# Patient Record
Sex: Male | Born: 1955 | Race: Black or African American | Hispanic: No | Marital: Married | Smoking: Former smoker
Health system: Southern US, Community
[De-identification: ages and names within clinical notes are randomized; demographics above are authoritative.]

## PROBLEM LIST (undated history)

## (undated) DIAGNOSIS — K649 Unspecified hemorrhoids: Secondary | ICD-10-CM

## (undated) DIAGNOSIS — G4730 Sleep apnea, unspecified: Secondary | ICD-10-CM

## (undated) DIAGNOSIS — I10 Essential (primary) hypertension: Secondary | ICD-10-CM

## (undated) DIAGNOSIS — N400 Enlarged prostate without lower urinary tract symptoms: Secondary | ICD-10-CM

## (undated) DIAGNOSIS — M1990 Unspecified osteoarthritis, unspecified site: Secondary | ICD-10-CM

## (undated) DIAGNOSIS — R7309 Other abnormal glucose: Secondary | ICD-10-CM

## (undated) DIAGNOSIS — M6530 Trigger finger, unspecified finger: Secondary | ICD-10-CM

## (undated) DIAGNOSIS — T7840XA Allergy, unspecified, initial encounter: Secondary | ICD-10-CM

## (undated) HISTORY — DX: Benign prostatic hyperplasia without lower urinary tract symptoms: N40.0

## (undated) HISTORY — DX: Essential (primary) hypertension: I10

## (undated) HISTORY — DX: Unspecified osteoarthritis, unspecified site: M19.90

## (undated) HISTORY — DX: Other abnormal glucose: R73.09

## (undated) HISTORY — DX: Trigger finger, unspecified finger: M65.30

## (undated) HISTORY — DX: Sleep apnea, unspecified: G47.30

## (undated) HISTORY — DX: Allergy, unspecified, initial encounter: T78.40XA

---

## 1980-06-16 HISTORY — PX: VASECTOMY: SHX75

## 2004-06-16 HISTORY — PX: COLONOSCOPY: SHX174

## 2005-01-22 ENCOUNTER — Ambulatory Visit: Payer: Self-pay

## 2007-06-12 ENCOUNTER — Ambulatory Visit: Payer: Self-pay | Admitting: Family Medicine

## 2007-06-17 HISTORY — PX: HERNIA REPAIR: SHX51

## 2007-10-29 ENCOUNTER — Ambulatory Visit: Payer: Self-pay | Admitting: Family Medicine

## 2008-05-26 ENCOUNTER — Ambulatory Visit: Payer: Self-pay | Admitting: General Surgery

## 2008-06-02 ENCOUNTER — Ambulatory Visit: Payer: Self-pay | Admitting: General Surgery

## 2009-03-03 DIAGNOSIS — R609 Edema, unspecified: Secondary | ICD-10-CM | POA: Insufficient documentation

## 2010-03-15 DIAGNOSIS — R7309 Other abnormal glucose: Secondary | ICD-10-CM | POA: Insufficient documentation

## 2010-03-15 DIAGNOSIS — I10 Essential (primary) hypertension: Secondary | ICD-10-CM | POA: Insufficient documentation

## 2011-10-22 ENCOUNTER — Ambulatory Visit: Payer: Self-pay | Admitting: Family Medicine

## 2013-12-07 ENCOUNTER — Encounter: Payer: Self-pay | Admitting: *Deleted

## 2013-12-23 ENCOUNTER — Encounter: Payer: Self-pay | Admitting: General Surgery

## 2013-12-26 ENCOUNTER — Encounter: Payer: Self-pay | Admitting: General Surgery

## 2013-12-26 ENCOUNTER — Ambulatory Visit (INDEPENDENT_AMBULATORY_CARE_PROVIDER_SITE_OTHER): Payer: BC Managed Care – PPO | Admitting: General Surgery

## 2013-12-26 VITALS — BP 128/76 | HR 74 | Resp 14 | Ht 71.0 in | Wt 258.0 lb

## 2013-12-26 DIAGNOSIS — M6208 Separation of muscle (nontraumatic), other site: Secondary | ICD-10-CM

## 2013-12-26 DIAGNOSIS — M6200 Separation of muscle (nontraumatic), unspecified site: Secondary | ICD-10-CM

## 2013-12-26 NOTE — Patient Instructions (Signed)
Patient to return as needed. The patient is aware to call back for any questions or concerns. 

## 2013-12-26 NOTE — Progress Notes (Signed)
Patient ID: Eric Maxon., male   DOB: 09-Jun-1956, 58 y.o.   MRN: [id number]  Chief Complaint  Patient presents with  . Other    evaluation of bulge in stomach    HPI Eric Kondracki. is a 58 y.o. male who present for an evaluation of a bulge in his stomach. He states it has been presents for approximately 3-5 years. He denies any pain in the area. The are has not gotten larger. No bowel problems.  The patient has been doing upper extremity strength training. He is not appreciated any bulge in the abdomen while standing or supine, only with changes in position.  HPI  Past Medical History  Diagnosis Date  . Hypertension   . Sleep apnea   . Prostate enlargement     Past Surgical History  Procedure Laterality Date  . Vasectomy  1982  . Hernia repair  5625    umbilical    Family History  Problem Relation Age of Onset  . Heart disease Father     Social History History  Substance Use Topics  . Smoking status: Former Smoker -- 1.00 packs/day for 24 years    Types: Cigarettes  . Smokeless tobacco: Not on file  . Alcohol Use: Yes    No Known Allergies  Current Outpatient Prescriptions  Medication Sig Dispense Refill  . lisinopril-hydrochlorothiazide (PRINZIDE,ZESTORETIC) 20-25 MG per tablet Take 1 tablet by mouth daily.      . Omega-3 Fatty Acids (FISH OIL) 1200 MG CAPS Take 1 capsule by mouth 2 (two) times daily.       No current facility-administered medications for this visit.    Review of Systems Review of Systems  Constitutional: Negative.   Respiratory: Negative.   Cardiovascular: Negative.   Gastrointestinal: Negative.     Blood pressure 128/76, pulse 74, resp. rate 14, height 5\' 11"  (1.803 m), weight 258 lb (117.028 kg).  Physical Exam Physical Exam  Constitutional: He is oriented to person, place, and time. He appears well-developed and well-nourished.  Cardiovascular: Normal rate, regular rhythm and normal heart sounds.   No murmur  heard. Pulmonary/Chest: Effort normal and breath sounds normal.  Abdominal: Soft. Normal appearance and bowel sounds are normal. There is no hepatosplenomegaly. There is no tenderness. No hernia.    Diastasis recti present.  The previously repaired umbilical hernia  Neurological: He is alert and oriented to person, place, and time.  Skin: Skin is warm and dry.      Assessment    Diastases recti, asymptomatic.     Plan    The mechanics of the rectus muscles, and the sail-like effect of the midline fascia was reviewed with the patient. Surgical imbrication can be done for cosmetic appearances, but I would not encourage this procedure.  The patient will notify the office if he has any other concerns, follow up otherwise will be on an as needed basis.     PCP: Mamie Laurel 12/27/2013, 9:00 PM

## 2013-12-27 DIAGNOSIS — M6208 Separation of muscle (nontraumatic), other site: Secondary | ICD-10-CM | POA: Insufficient documentation

## 2014-12-06 ENCOUNTER — Encounter: Payer: BLUE CROSS/BLUE SHIELD | Admitting: Family Medicine

## 2014-12-12 ENCOUNTER — Ambulatory Visit (INDEPENDENT_AMBULATORY_CARE_PROVIDER_SITE_OTHER): Payer: BLUE CROSS/BLUE SHIELD | Admitting: Family Medicine

## 2014-12-12 ENCOUNTER — Encounter: Payer: Self-pay | Admitting: Family Medicine

## 2014-12-12 VITALS — BP 112/68 | HR 88 | Temp 97.8°F | Resp 18 | Ht 68.0 in | Wt 263.9 lb

## 2014-12-12 DIAGNOSIS — J309 Allergic rhinitis, unspecified: Secondary | ICD-10-CM | POA: Insufficient documentation

## 2014-12-12 DIAGNOSIS — Z0000 Encounter for general adult medical examination without abnormal findings: Secondary | ICD-10-CM | POA: Insufficient documentation

## 2014-12-12 NOTE — Patient Instructions (Addendum)
Obesity Obesity is defined as having too much total body fat and a body mass index (BMI) of 30 or more. BMI is an estimate of body fat and is calculated from your height and weight. Obesity happens when you consume more calories than you can burn by exercising or performing daily physical tasks. Prolonged obesity can cause major illnesses or emergencies, such as:   Stroke.  Heart disease.  Diabetes.  Cancer.  Arthritis.  High blood pressure (hypertension).  High cholesterol.  Sleep apnea.  Erectile dysfunction.  Infertility problems. CAUSES   Regularly eating unhealthy foods.  Physical inactivity.  Certain disorders, such as an underactive thyroid (hypothyroidism), Cushing's syndrome, and polycystic ovarian syndrome.  Certain medicines, such as steroids, some depression medicines, and antipsychotics.  Genetics.  Lack of sleep. DIAGNOSIS  A health care provider can diagnose obesity after calculating your BMI. Obesity will be diagnosed if your BMI is 30 or higher.  There are other methods of measuring obesity levels. Some other methods include measuring your skinfold thickness, your waist circumference, and comparing your hip circumference to your waist circumference. TREATMENT  A healthy treatment program includes some or all of the following:  Long-term dietary changes.  Exercise and physical activity.  Behavioral and lifestyle changes.  Medicine only under the supervision of your health care provider. Medicines may help, but only if they are used with diet and exercise programs. An unhealthy treatment program includes:  Fasting.  Fad diets.  Supplements and drugs. These choices do not succeed in long-term weight control.  HOME CARE INSTRUCTIONS   Exercise and perform physical activity as directed by your health care provider. To increase physical activity, try the following:  Use stairs instead of elevators.  Park farther away from store  entrances.  Garden, bike, or walk instead of watching television or using the computer.  Eat healthy, low-calorie foods and drinks on a regular basis. Eat more fruits and vegetables. Use low-calorie cookbooks or take healthy cooking classes.  Limit fast food, sweets, and processed snack foods.  Eat smaller portions.  Keep a daily journal of everything you eat. There are many free websites to help you with this. It may be helpful to measure your foods so you can determine if you are eating the correct portion sizes.  Avoid drinking alcohol. Drink more water and drinks without calories.  Take vitamins and supplements only as recommended by your health care provider.  Weight-loss support groups, Tax adviser, counselors, and stress reduction education can also be very helpful. SEEK IMMEDIATE MEDICAL CARE IF:  You have chest pain or tightness.  You have trouble breathing or feel short of breath.  You have weakness or leg numbness.  You feel confused or have trouble talking.  You have sudden changes in your vision. MAKE SURE YOU:  Understand these instructions.  Will watch your condition.  Will get help right away if you are not doing well or get worse. Document Released: 07/10/2004 Document Revised: 10/17/2013 Document Reviewed: 07/09/2011 [facility] Patient Information 2015 [place], [place]. This information is not intended to replace advice given to you by your health care provider. Make sure you discuss any questions you have with your health care provider. Obesity Obesity is defined as having too much total body fat and a body mass index (BMI) of 30 or more. BMI is an estimate of body fat and is calculated from your height and weight. Obesity happens when you consume more calories than you can burn by exercising or performing daily physical  tasks. Prolonged obesity can cause major illnesses or emergencies, such as:   Stroke.  Heart  disease.  Diabetes.  Cancer.  Arthritis.  High blood pressure (hypertension).  High cholesterol.  Sleep apnea.  Erectile dysfunction.  Infertility problems. CAUSES   Regularly eating unhealthy foods.  Physical inactivity.  Certain disorders, such as an underactive thyroid (hypothyroidism), Cushing's syndrome, and polycystic ovarian syndrome.  Certain medicines, such as steroids, some depression medicines, and antipsychotics.  Genetics.  Lack of sleep. DIAGNOSIS  A health care provider can diagnose obesity after calculating your BMI. Obesity will be diagnosed if your BMI is 30 or higher.  There are other methods of measuring obesity levels. Some other methods include measuring your skinfold thickness, your waist circumference, and comparing your hip circumference to your waist circumference. TREATMENT  A healthy treatment program includes some or all of the following:  Long-term dietary changes.  Exercise and physical activity.  Behavioral and lifestyle changes.  Medicine only under the supervision of your health care provider. Medicines may help, but only if they are used with diet and exercise programs. An unhealthy treatment program includes:  Fasting.  Fad diets.  Supplements and drugs. These choices do not succeed in long-term weight control.  HOME CARE INSTRUCTIONS   Exercise and perform physical activity as directed by your health care provider. To increase physical activity, try the following:  Use stairs instead of elevators.  Park farther away from store entrances.  Garden, bike, or walk instead of watching television or using the computer.  Eat healthy, low-calorie foods and drinks on a regular basis. Eat more fruits and vegetables. Use low-calorie cookbooks or take healthy cooking classes.  Limit fast food, sweets, and processed snack foods.  Eat smaller portions.  Keep a daily journal of everything you eat. There are many free websites to  help you with this. It may be helpful to measure your foods so you can determine if you are eating the correct portion sizes.  Avoid drinking alcohol. Drink more water and drinks without calories.  Take vitamins and supplements only as recommended by your health care provider.  Weight-loss support groups, Tax adviser, counselors, and stress reduction education can also be very helpful. SEEK IMMEDIATE MEDICAL CARE IF:  You have chest pain or tightness.  You have trouble breathing or feel short of breath.  You have weakness or leg numbness.  You feel confused or have trouble talking.  You have sudden changes in your vision. MAKE SURE YOU:  Understand these instructions.  Will watch your condition.  Will get help right away if you are not doing well or get worse. Document Released: 07/10/2004 Document Revised: 10/17/2013 Document Reviewed: 07/09/2011 [facility] Patient Information 2015 [place], [place]. This information is not intended to replace advice given to you by your health care provider. Make sure you discuss any questions you have with your health care provider.

## 2014-12-12 NOTE — Progress Notes (Signed)
Name: Eric English.   MRN: [id number]    DOB: May 25, 1956   Date:12/12/2014       Progress Note  Subjective  Chief Complaint  Chief Complaint  Patient presents with  . Annual Exam    HPI   59 year old presenting for annual H&P no significant new problems  Past Medical History  Diagnosis Date  . Hypertension   . Sleep apnea   . Prostate enlargement   . Trigger finger   . Abnormal glucose     History  Substance Use Topics  . Smoking status: Former Smoker -- 1.00 packs/day for 24 years    Types: Cigarettes  . Smokeless tobacco: Not on file  . Alcohol Use: 0.0 oz/week    0 Standard drinks or equivalent per week     Comment: Occasionally     Current outpatient prescriptions:  .  fluticasone (FLONASE ALLERGY RELIEF) 50 MCG/ACT nasal spray, Place 2 sprays into the nose daily., Disp: , Rfl:  .  lisinopril-hydrochlorothiazide (PRINZIDE,ZESTORETIC) 20-25 MG per tablet, Take 1 tablet by mouth daily., Disp: , Rfl:  .  Omega-3 Fatty Acids (FISH OIL) 1200 MG CAPS, Take 1 capsule by mouth 2 (two) times daily., Disp: , Rfl:   No Known Allergies  Review of Systems  Constitutional: Negative for fever, chills and weight loss.       Obese  HENT: Positive for congestion and ear pain. Negative for hearing loss, sore throat and tinnitus.   Eyes: Negative for blurred vision, double vision and redness.  Respiratory: Positive for cough. Negative for hemoptysis and shortness of breath.   Cardiovascular: Positive for leg swelling. Negative for chest pain, palpitations, orthopnea and claudication.  Gastrointestinal: Negative for heartburn, nausea, vomiting, diarrhea, constipation and blood in stool.  Genitourinary: Negative for dysuria, urgency, frequency and hematuria.  Musculoskeletal: Negative for myalgias, back pain, joint pain, falls and neck pain.  Skin: Negative for itching.  Neurological: Negative for dizziness, tingling, tremors, focal weakness, seizures, loss of consciousness,  weakness and headaches.  Endo/Heme/Allergies: Does not bruise/bleed easily.  Psychiatric/Behavioral: Negative for depression, memory loss and substance abuse. The patient is not nervous/anxious and does not have insomnia.      Objective  Filed Vitals:   12/12/14 1508  BP: 112/68  Pulse: 88  Temp: 97.8 F (36.6 C)  TempSrc: Oral  Resp: 18  Height: 5\' 8"  (1.727 m)  Weight: 263 lb 14.4 oz (119.704 kg)  SpO2: 96%     Physical Exam  Constitutional: He is oriented to person, place, and time and well-developed, well-nourished, and in no distress.  Obese  HENT:  Head: Normocephalic.  Eyes: EOM are normal. Pupils are equal, round, and reactive to light.  Neck: Normal range of motion. Neck supple. No thyromegaly present.  Cardiovascular: Normal rate, regular rhythm and normal heart sounds.   No murmur heard. Pulmonary/Chest: Effort normal and breath sounds normal. No respiratory distress. He has no wheezes.  Abdominal: Soft. Bowel sounds are normal.  Diastases recti  Genitourinary: Rectum normal, prostate normal and penis normal. Guaiac negative stool.  Musculoskeletal: Normal range of motion. He exhibits tenderness. He exhibits no edema.  He exhibits some arthritic changes in several of the joints of the hand and also has a right third trigger finger  Lymphadenopathy:    He has no cervical adenopathy.  Neurological: He is alert and oriented to person, place, and time. No cranial nerve deficit. Gait normal. Coordination normal.  Skin: Skin is warm and dry. No rash noted.  Psychiatric: Affect and judgment normal.      Assessment & Plan  1. Annual physical exam  - CBC with Differential/Platelet - PSA - Comprehensive metabolic panel - Lipid panel - POC Hemoccult Bld/Stl (3-Cd Home Screen); Future - TSH - POC Hemoccult Bld/Stl (1-Cd Office Dx) - POC Hemoccult Bld/Stl (3-Cd Home Screen); Future

## 2014-12-25 LAB — CBC WITH DIFFERENTIAL/PLATELET
BASOS ABS: 0 10*3/uL (ref 0.0–0.2)
Basos: 0 %
EOS (ABSOLUTE): 0.1 10*3/uL (ref 0.0–0.4)
Eos: 1 %
HEMATOCRIT: 41.8 % (ref 37.5–51.0)
Hemoglobin: 13.7 g/dL (ref 12.6–17.7)
IMMATURE GRANS (ABS): 0 10*3/uL (ref 0.0–0.1)
Immature Granulocytes: 0 %
LYMPHS ABS: 2.1 10*3/uL (ref 0.7–3.1)
Lymphs: 32 %
MCH: 28.8 pg (ref 26.6–33.0)
MCHC: 32.8 g/dL (ref 31.5–35.7)
MCV: 88 fL (ref 79–97)
MONOCYTES: 10 %
Monocytes Absolute: 0.7 10*3/uL (ref 0.1–0.9)
NEUTROS ABS: 3.8 10*3/uL (ref 1.4–7.0)
Neutrophils: 57 %
Platelets: 271 10*3/uL (ref 150–379)
RBC: 4.76 x10E6/uL (ref 4.14–5.80)
RDW: 14.1 % (ref 12.3–15.4)
WBC: 6.6 10*3/uL (ref 3.4–10.8)

## 2014-12-26 LAB — COMPREHENSIVE METABOLIC PANEL
ALT: 37 IU/L (ref 0–44)
AST: 25 IU/L (ref 0–40)
Albumin/Globulin Ratio: 1.3 (ref 1.1–2.5)
Albumin: 4.3 g/dL (ref 3.6–4.8)
Alkaline Phosphatase: 64 IU/L (ref 39–117)
BILIRUBIN TOTAL: 0.4 mg/dL (ref 0.0–1.2)
BUN/Creatinine Ratio: 9 — ABNORMAL LOW (ref 10–22)
BUN: 10 mg/dL (ref 8–27)
CO2: 26 mmol/L (ref 18–29)
Calcium: 9.8 mg/dL (ref 8.6–10.2)
Chloride: 97 mmol/L (ref 97–108)
Creatinine, Ser: 1.06 mg/dL (ref 0.76–1.27)
GFR calc Af Amer: 88 mL/min/{1.73_m2} (ref >59)
GFR, EST NON AFRICAN AMERICAN: 76 mL/min/{1.73_m2} (ref >59)
Globulin, Total: 3.3 g/dL (ref 1.5–4.5)
Glucose: 93 mg/dL (ref 65–99)
POTASSIUM: 4 mmol/L (ref 3.5–5.2)
SODIUM: 138 mmol/L (ref 134–144)
TOTAL PROTEIN: 7.6 g/dL (ref 6.0–8.5)

## 2014-12-26 LAB — PSA: PROSTATE SPECIFIC AG, SERUM: 3.6 ng/mL (ref 0.0–4.0)

## 2014-12-26 LAB — LIPID PANEL
Chol/HDL Ratio: 4.5 ratio units (ref 0.0–5.0)
Cholesterol, Total: 183 mg/dL (ref 100–199)
HDL: 41 mg/dL (ref >39)
LDL CALC: 113 mg/dL — AB (ref 0–99)
TRIGLYCERIDES: 144 mg/dL (ref 0–149)
VLDL CHOLESTEROL CAL: 29 mg/dL (ref 5–40)

## 2014-12-26 LAB — TSH: TSH: 1.41 u[IU]/mL (ref 0.450–4.500)

## 2015-01-10 ENCOUNTER — Encounter: Admitting: Family Medicine

## 2015-02-28 ENCOUNTER — Ambulatory Visit (INDEPENDENT_AMBULATORY_CARE_PROVIDER_SITE_OTHER): Payer: BLUE CROSS/BLUE SHIELD | Admitting: Family Medicine

## 2015-02-28 ENCOUNTER — Encounter: Payer: Self-pay | Admitting: Family Medicine

## 2015-02-28 VITALS — BP 128/68 | HR 68 | Temp 97.6°F | Resp 16 | Ht 68.0 in | Wt 262.5 lb

## 2015-02-28 DIAGNOSIS — Z23 Encounter for immunization: Secondary | ICD-10-CM

## 2015-02-28 DIAGNOSIS — I10 Essential (primary) hypertension: Secondary | ICD-10-CM | POA: Diagnosis not present

## 2015-03-07 NOTE — Progress Notes (Signed)
Name: Eric English.   MRN: [id number]    DOB: [birth year]   Date:03/07/2015       Progress Note  Subjective  Chief Complaint  Chief Complaint  Patient presents with  . Hypertension    pt here for 3 month follow up    HPI  Hypertension  Patient presents for follow-up of hypertension. Risk factors include obesity sedentary lifestyle and lipid abnormality inactivity as far as cardiac risk concern.  He is not exercising with regularity. He is not following his low-sodium low-fat diet with regularity.    Past Medical History  Diagnosis Date  . Hypertension   . Sleep apnea   . Prostate enlargement   . Trigger finger   . Abnormal glucose     Social History  Substance Use Topics  . Smoking status: Former Smoker -- 1.00 packs/day for 24 years    Types: Cigarettes  . Smokeless tobacco: Not on file  . Alcohol Use: 0.0 oz/week    0 Standard drinks or equivalent per week     Comment: Occasionally     Current outpatient prescriptions:  .  fluticasone (FLONASE ALLERGY RELIEF) 50 MCG/ACT nasal spray, Place 2 sprays into the nose daily., Disp: , Rfl:  .  lisinopril-hydrochlorothiazide (PRINZIDE,ZESTORETIC) 20-25 MG per tablet, Take 1 tablet by mouth daily., Disp: , Rfl:  .  Omega-3 Fatty Acids (FISH OIL) 1200 MG CAPS, Take 1 capsule by mouth 2 (two) times daily., Disp: , Rfl:   No Known Allergies  Review of Systems  Constitutional: Negative for fever, chills and weight loss.  HENT: Negative for congestion, hearing loss, sore throat and tinnitus.   Eyes: Negative for blurred vision, double vision and redness.  Respiratory: Negative for cough, hemoptysis and shortness of breath.   Cardiovascular: Negative for chest pain, palpitations, orthopnea, claudication and leg swelling.  Gastrointestinal: Negative for heartburn, nausea, vomiting, diarrhea, constipation and blood in stool.  Genitourinary: Negative for dysuria, urgency, frequency and hematuria.  Musculoskeletal: Positive  for joint pain. Negative for myalgias, back pain, falls and neck pain.  Skin: Negative for itching.  Neurological: Negative for dizziness, tingling, tremors, focal weakness, seizures, loss of consciousness, weakness and headaches.  Endo/Heme/Allergies: Does not bruise/bleed easily.  Psychiatric/Behavioral: Negative for depression and substance abuse. The patient is not nervous/anxious and does not have insomnia.      Objective  Filed Vitals:   02/28/15 1156  BP: 128/68  Pulse: 68  Temp: 97.6 F (36.4 C)  Resp: 16  Height: 5\' 8"  (1.727 m)  Weight: 262 lb 8 oz (119.069 kg)  SpO2: 97%     Physical Exam  Constitutional: He is oriented to person, place, and time and well-developed, well-nourished, and in no distress.  Obese and in no acute distress  HENT:  Head: Normocephalic.  Eyes: EOM are normal. Pupils are equal, round, and reactive to light.  Neck: Normal range of motion. Neck supple. No thyromegaly present.  Cardiovascular: Normal rate, regular rhythm and normal heart sounds.   No murmur heard. Pulmonary/Chest: Effort normal and breath sounds normal. No respiratory distress. He has no wheezes.  Abdominal: Soft. Bowel sounds are normal.  Musculoskeletal: Normal range of motion. He exhibits no edema.  Lymphadenopathy:    He has no cervical adenopathy.  Neurological: He is alert and oriented to person, place, and time. No cranial nerve deficit. Gait normal. Coordination normal.  Skin: Skin is warm and dry. No rash noted.  Psychiatric: Mood, memory, affect and judgment normal.  Assessment & Plan  1. Essential hypertension Well-controlled given today  2. Need for influenza vaccination Given today  Flu Vaccine QUAD 36+ mos PF IM (Fluarix & Fluzone Quad PF)

## 2015-03-13 DIAGNOSIS — Z23 Encounter for immunization: Secondary | ICD-10-CM

## 2015-05-31 ENCOUNTER — Telehealth: Payer: Self-pay | Admitting: Family Medicine

## 2015-05-31 DIAGNOSIS — Z1211 Encounter for screening for malignant neoplasm of colon: Secondary | ICD-10-CM

## 2015-06-05 ENCOUNTER — Other Ambulatory Visit: Payer: Self-pay | Admitting: Family Medicine

## 2015-06-05 MED ORDER — LISINOPRIL-HYDROCHLOROTHIAZIDE 20-25 MG PO TABS: 1.0000 | ORAL_TABLET | Freq: Every day | ORAL | Status: DC

## 2015-06-13 NOTE — Telephone Encounter (Signed)
Patient notified

## 2015-06-21 ENCOUNTER — Ambulatory Visit
Admission: RE | Admit: 2015-06-21 | Discharge: 2015-06-21 | Disposition: A | Discharge status: Home / Self Care | Payer: BLUE CROSS/BLUE SHIELD | Source: Ambulatory Visit | Attending: Family Medicine | Admitting: Family Medicine

## 2015-06-21 ENCOUNTER — Ambulatory Visit (INDEPENDENT_AMBULATORY_CARE_PROVIDER_SITE_OTHER): Payer: BLUE CROSS/BLUE SHIELD | Admitting: General Surgery

## 2015-06-21 ENCOUNTER — Encounter: Payer: Self-pay | Admitting: General Surgery

## 2015-06-21 ENCOUNTER — Ambulatory Visit (INDEPENDENT_AMBULATORY_CARE_PROVIDER_SITE_OTHER): Payer: BLUE CROSS/BLUE SHIELD | Admitting: Family Medicine

## 2015-06-21 ENCOUNTER — Encounter: Payer: Self-pay | Admitting: Family Medicine

## 2015-06-21 ENCOUNTER — Telehealth: Payer: Self-pay | Admitting: Emergency Medicine

## 2015-06-21 VITALS — BP 138/78 | HR 66 | Resp 14 | Ht 70.0 in | Wt 264.0 lb

## 2015-06-21 VITALS — BP 132/78 | HR 83 | Temp 98.2°F | Resp 18 | Ht 68.0 in | Wt 264.0 lb

## 2015-06-21 DIAGNOSIS — I809 Phlebitis and thrombophlebitis of unspecified site: Secondary | ICD-10-CM

## 2015-06-21 DIAGNOSIS — M79671 Pain in right foot: Secondary | ICD-10-CM

## 2015-06-21 DIAGNOSIS — L03115 Cellulitis of right lower limb: Secondary | ICD-10-CM | POA: Diagnosis not present

## 2015-06-21 DIAGNOSIS — M25571 Pain in right ankle and joints of right foot: Secondary | ICD-10-CM | POA: Insufficient documentation

## 2015-06-21 DIAGNOSIS — M25474 Effusion, right foot: Secondary | ICD-10-CM | POA: Diagnosis not present

## 2015-06-21 DIAGNOSIS — M7989 Other specified soft tissue disorders: Secondary | ICD-10-CM | POA: Diagnosis not present

## 2015-06-21 DIAGNOSIS — L03129 Acute lymphangitis of unspecified part of limb: Secondary | ICD-10-CM

## 2015-06-21 DIAGNOSIS — Z1211 Encounter for screening for malignant neoplasm of colon: Secondary | ICD-10-CM

## 2015-06-21 MED ORDER — POLYETHYLENE GLYCOL 3350 17 GM/SCOOP PO POWD: ORAL | Status: DC

## 2015-06-21 MED ORDER — CLINDAMYCIN HCL 300 MG PO CAPS: 300.0000 mg | ORAL_CAPSULE | Freq: Three times a day (TID) | ORAL | Status: DC

## 2015-06-21 MED ORDER — INDOMETHACIN 50 MG PO CAPS: 50.0000 mg | ORAL_CAPSULE | Freq: Two times a day (BID) | ORAL | Status: DC

## 2015-06-21 NOTE — Telephone Encounter (Signed)
Patient notified L/M on cell

## 2015-06-21 NOTE — Progress Notes (Signed)
Name: Eric English.   MRN: [id number]    DOB: 1956-05-10   Date:06/21/2015       Progress Note  Subjective  Chief Complaint  Chief Complaint  Patient presents with  . Ankle Pain    possible gout    HPI  Right foot pain.  Complaint of right foot pain and discomfort over the past several days. Had a similar episode several months ago was seen by one of her colleagues. X-rays were normal but he did not get him. He was placed on methicillin for presumed gout and improved.  Patient states that he ate some and customary foods over the holidays and then a day later started having some itching sensation. He has had some chills but no documented fever. He's had some warmth and redness of the right lower extremity which is increasing. It began just at his ankle and then moved up slightly brisk But has continued to progress. There's no shortness of breath or hemoptysis. Has a history of any pulmonary emboli. 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 There is a family history of gout.    Past Medical History  Diagnosis Date  . Hypertension   . Sleep apnea   . Prostate enlargement   . Trigger finger   . Abnormal glucose     Social History  Substance Use Topics  . Smoking status: Former Smoker -- 1.00 packs/day for 24 years    Types: Cigarettes  . Smokeless tobacco: Not on file  . Alcohol Use: 0.0 oz/week    0 Standard drinks or equivalent per week     Comment: Occasionally     Current outpatient prescriptions:  .  fluticasone (FLONASE ALLERGY RELIEF) 50 MCG/ACT nasal spray, Place 2 sprays into the nose daily., Disp: , Rfl:  .  lisinopril-hydrochlorothiazide (PRINZIDE,ZESTORETIC) 20-25 MG tablet, Take 1 tablet by mouth daily., Disp: 90 tablet, Rfl: 1 .  Omega-3 Fatty Acids (FISH OIL) 1200 MG CAPS, Take 1 capsule by mouth 2 (two) times daily., Disp: , Rfl:   No Known Allergies  Review of Systems  Constitutional: Positive for chills. Negative for fever and weight loss.   HENT: Negative for congestion, hearing loss, sore throat and tinnitus.   Eyes: Negative for blurred vision, double vision and redness.  Respiratory: Negative for cough, hemoptysis and shortness of breath.   Cardiovascular: Negative for chest pain, palpitations, orthopnea, claudication and leg swelling.  Gastrointestinal: Negative for heartburn, nausea, vomiting, diarrhea, constipation and blood in stool.  Genitourinary: Negative for dysuria, urgency, frequency and hematuria.  Musculoskeletal: Positive for joint pain. Negative for myalgias, back pain, falls and neck pain.  Skin: Positive for itching and rash.  Neurological: Negative for dizziness, tingling, tremors, focal weakness, seizures, loss of consciousness, weakness and headaches.  Endo/Heme/Allergies: Does not bruise/bleed easily.  Psychiatric/Behavioral: Negative for depression and substance abuse. The patient is not nervous/anxious and does not have insomnia.      Objective  Filed Vitals:   06/21/15 0952  BP: 132/78  Pulse: 83  Temp: 98.2 F (36.8 C)  Resp: 18  Height: 5' 8"  (1.727 m)  Weight: 264 lb (119.75 kg)  SpO2: 98%     Physical Exam  Constitutional: He is oriented to person, place, and time.  Obese in no acute distress  HENT:  Head: Normocephalic.  Eyes: EOM are normal. Pupils are equal, round, and reactive to light.  Neck: Normal range of motion. Neck supple. No thyromegaly present.  Cardiovascular: Normal rate, regular rhythm, normal  heart sounds and intact distal pulses.   No murmur heard. Pulmonary/Chest: Effort normal and breath sounds normal. No respiratory distress. He has no wheezes.  Abdominal: Soft. Bowel sounds are normal.  Musculoskeletal: He exhibits no edema.  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 Homans sign on sign is negative. There is erythema on the overlying area.  Lymphadenopathy:    He has no cervical adenopathy.  Neurological: He  is alert and oriented to person, place, and time. No cranial nerve deficit. Gait normal. Coordination normal.  Skin: Skin is warm and dry. No rash noted. There is erythema.  Psychiatric: Affect and judgment normal.      Assessment & Plan   1. Foot pain, right Possible gout - Uric acid - indomethacin (INDOCIN) 50 MG capsule; Take 1 capsule (50 mg total) by mouth 2 (two) times daily with a meal.  Dispense: 30 capsule; Refill: 0 - DG Foot Complete Right; Future  2. Cellulitis of right lower extremity Consistent with cellulitis with lymphangitis - CBC - Sed Rate (ESR) - clindamycin (CLEOCIN) 300 MG capsule; Take 1 capsule (300 mg total) by mouth 3 (three) times daily.  Dispense: 30 capsule; Refill: 0  3. Lymphangitis, acute, lower leg Compression stockings or socks

## 2015-06-21 NOTE — Addendum Note (Signed)
Addended by: Lolita Rieger D on: 06/21/2015 11:36 AM   Modules accepted: Orders

## 2015-06-21 NOTE — Progress Notes (Signed)
Patient ID: Eric English., male   DOB: 1955-06-23, 60 y.o.   MRN: [id number]  Chief Complaint  Patient presents with  . Other    colonoscopy screening    HPI Eric English. is a 60 y.o. male here today for a evaluation an screening colonoscopy . Last colonoscopy was done in 2006. No GI problems.   He states he has been having some right inner lower leg swelling for three days now. He was started on Indocin and Cleocin by his PCP earlier today. No history of trauma.  I first interviewed the patient's history. HPI  Past Medical History  Diagnosis Date  . Hypertension   . Sleep apnea   . Prostate enlargement   . Trigger finger   . Abnormal glucose     Past Surgical History  Procedure Laterality Date  . Vasectomy  1982  . Hernia repair  [id number]    umbilical  . Colonoscopy  2006    Family History  Problem Relation Age of Onset  . Heart disease Father   . Diabetes Mother   . Hypertension Mother   . CAD Mother   . Hyperlipidemia Mother   . CAD Father   . Diabetes Father     Social History Social History  Substance Use Topics  . Smoking status: Former Smoker -- 1.00 packs/day for 24 years    Types: Cigarettes  . Smokeless tobacco: None  . Alcohol Use: 0.0 oz/week    0 Standard drinks or equivalent per week     Comment: Occasionally    No Known Allergies  Current Outpatient Prescriptions  Medication Sig Dispense Refill  . clindamycin (CLEOCIN) 300 MG capsule Take 1 capsule (300 mg total) by mouth 3 (three) times daily. 30 capsule 0  . fluticasone (FLONASE ALLERGY RELIEF) 50 MCG/ACT nasal spray Place 2 sprays into the nose daily.    . indomethacin (INDOCIN) 50 MG capsule Take 1 capsule (50 mg total) by mouth 2 (two) times daily with a meal. 30 capsule 0  . lisinopril-hydrochlorothiazide (PRINZIDE,ZESTORETIC) 20-25 MG tablet Take 1 tablet by mouth daily. 90 tablet 1  . Omega-3 Fatty Acids (FISH OIL) 1200 MG CAPS Take 1 capsule by mouth 2 (two) times daily.     . polyethylene glycol powder (GLYCOLAX/MIRALAX) powder 255 grams one bottle for colonoscopy prep 255 g 0   No current facility-administered medications for this visit.    Review of Systems Review of Systems  Constitutional: Negative.   Respiratory: Negative.   Cardiovascular: Negative.     Blood pressure 138/78, pulse 66, resp. rate 14, height 5\' 10"  (1.778 m), weight 264 lb (119.75 kg).  Physical Exam Physical Exam  Constitutional: He is oriented to person, place, and time. He appears well-developed and well-nourished.  Cardiovascular: Normal rate, regular rhythm and normal heart sounds.   Pulmonary/Chest: Effort normal and breath sounds normal.  Abdominal:    Musculoskeletal:       Legs: Neurological: He is alert and oriented to person, place, and time.  Skin: Skin is warm and dry.  Right leg is 42,32 and left leg 44, 32    Data Reviewed PCP notes.  Assessment    Candidate for screening colonoscopy.  Superficial phlebitis without evidence of DVT.    Plan         Colonoscopy with possible biopsy/polypectomy prn: Information regarding the procedure, including its potential risks and complications (including but not limited to perforation of the bowel, which may require emergency surgery to  repair, and bleeding) was verbally given to the patient. Educational information regarding lower intestinal endoscopy was given to the patient. Written instructions for how to complete the bowel prep using Miralax were provided. The importance of drinking ample fluids to avoid dehydration as a result of the prep emphasized.  Patient to pick up a probiotics.  This patient has been scheduled for a colonoscopy on 08-15-15 at [facility].  PCP:  Ashok Norris  This information has been scribed by Gaspar Cola CMA.    Eric English 06/23/2015, 9:05 AM

## 2015-06-21 NOTE — Patient Instructions (Addendum)
Colonoscopy A colonoscopy is an exam to look at the entire large intestine (colon). This exam can help find problems such as tumors, polyps, inflammation, and areas of bleeding. The exam takes about 1 hour.  LET [facility] CARE PROVIDER KNOW ABOUT:   Any allergies you have.  All medicines you are taking, including vitamins, herbs, eye drops, creams, and over-the-counter medicines.  Previous problems you or members of your family have had with the use of anesthetics.  Any blood disorders you have.  Previous surgeries you have had.  Medical conditions you have. RISKS AND COMPLICATIONS  Generally, this is a safe procedure. However, as with any procedure, complications can occur. Possible complications include:  Bleeding.  Tearing or rupture of the colon wall.  Reaction to medicines given during the exam.  Infection (rare). BEFORE THE PROCEDURE   Ask your health care provider about changing or stopping your regular medicines.  You may be prescribed an oral bowel prep. This involves drinking a large amount of medicated liquid, starting the day before your procedure. The liquid will cause you to have multiple loose stools until your stool is almost clear or light green. This cleans out your colon in preparation for the procedure.  Do not eat or drink anything else once you have started the bowel prep, unless your health care provider tells you it is safe to do so.  Arrange for someone to drive you home after the procedure. PROCEDURE   You will be given medicine to help you relax (sedative).  You will lie on your side with your knees bent.  A long, flexible tube with a light and camera on the end (colonoscope) will be inserted through the rectum and into the colon. The camera sends video back to a computer screen as it moves through the colon. The colonoscope also releases carbon dioxide gas to inflate the colon. This helps your health care provider see the area better.  During  the exam, your health care provider may take a small tissue sample (biopsy) to be examined under a microscope if any abnormalities are found.  The exam is finished when the entire colon has been viewed. AFTER THE PROCEDURE   Do not drive for 24 hours after the exam.  You may have a small amount of blood in your stool.  You may pass moderate amounts of gas and have mild abdominal cramping or bloating. This is caused by the gas used to inflate your colon during the exam.  Ask when your test results will be ready and how you will get your results. Make sure you get your test results.   This information is not intended to replace advice given to you by your health care provider. Make sure you discuss any questions you have with your health care provider.   Document Released: 05/30/2000 Document Revised: 03/23/2013 Document Reviewed: 02/07/2013 Elsevier Interactive Patient Education [employer].  This patient has been scheduled for a colonoscopy on 08-15-15 at [facility].

## 2015-06-22 LAB — CBC
Hematocrit: 40.2 % (ref 37.5–51.0)
Hemoglobin: 13.7 g/dL (ref 12.6–17.7)
MCH: 29.3 pg (ref 26.6–33.0)
MCHC: 34.1 g/dL (ref 31.5–35.7)
MCV: 86 fL (ref 79–97)
Platelets: 294 10*3/uL (ref 150–379)
RBC: 4.68 x10E6/uL (ref 4.14–5.80)
RDW: 13.7 % (ref 12.3–15.4)
WBC: 7.7 10*3/uL (ref 3.4–10.8)

## 2015-06-22 LAB — URIC ACID: URIC ACID: 7.7 mg/dL (ref 3.7–8.6)

## 2015-06-22 LAB — SEDIMENTATION RATE: SED RATE: 14 mm/h (ref 0–30)

## 2015-06-23 DIAGNOSIS — I809 Phlebitis and thrombophlebitis of unspecified site: Secondary | ICD-10-CM | POA: Insufficient documentation

## 2015-06-23 DIAGNOSIS — Z1211 Encounter for screening for malignant neoplasm of colon: Secondary | ICD-10-CM | POA: Insufficient documentation

## 2015-07-08 ENCOUNTER — Encounter: Payer: Self-pay | Admitting: Gynecology

## 2015-07-08 ENCOUNTER — Ambulatory Visit
Admission: EM | Admit: 2015-07-08 | Discharge: 2015-07-08 | Disposition: A | Discharge status: Home / Self Care | Payer: BLUE CROSS/BLUE SHIELD | Attending: Family Medicine | Admitting: Family Medicine

## 2015-07-08 DIAGNOSIS — T39395A Adverse effect of other nonsteroidal anti-inflammatory drugs [NSAID], initial encounter: Secondary | ICD-10-CM

## 2015-07-08 DIAGNOSIS — K521 Toxic gastroenteritis and colitis: Secondary | ICD-10-CM

## 2015-07-08 DIAGNOSIS — T3695XA Adverse effect of unspecified systemic antibiotic, initial encounter: Secondary | ICD-10-CM | POA: Diagnosis not present

## 2015-07-08 LAB — COMPREHENSIVE METABOLIC PANEL
ALT: 137 U/L — ABNORMAL HIGH (ref 17–63)
AST: 108 U/L — ABNORMAL HIGH (ref 15–41)
Albumin: 4 g/dL (ref 3.5–5.0)
Alkaline Phosphatase: 58 U/L (ref 38–126)
Anion gap: 9 (ref 5–15)
BUN: 13 mg/dL (ref 6–20)
CO2: 28 mmol/L (ref 22–32)
Calcium: 9.2 mg/dL (ref 8.9–10.3)
Chloride: 99 mmol/L — ABNORMAL LOW (ref 101–111)
Creatinine, Ser: 0.91 mg/dL (ref 0.61–1.24)
GFR calc Af Amer: >60 mL/min (ref >60)
GFR calc non Af Amer: >60 mL/min (ref >60)
Glucose, Bld: 108 mg/dL — ABNORMAL HIGH (ref 65–99)
Potassium: 3.4 mmol/L — ABNORMAL LOW (ref 3.5–5.1)
Sodium: 136 mmol/L (ref 135–145)
Total Bilirubin: 0.9 mg/dL (ref 0.3–1.2)
Total Protein: 8 g/dL (ref 6.5–8.1)

## 2015-07-08 LAB — CBC WITH DIFFERENTIAL/PLATELET
Basophils Absolute: 0 10*3/uL (ref 0–0.1)
Basophils Relative: 0 %
Eosinophils Absolute: 0.1 10*3/uL (ref 0–0.7)
Eosinophils Relative: 2 %
HCT: 41.9 % (ref 40.0–52.0)
Hemoglobin: 14.1 g/dL (ref 13.0–18.0)
Lymphocytes Relative: 12 %
Lymphs Abs: 0.7 10*3/uL — ABNORMAL LOW (ref 1.0–3.6)
MCH: 28.8 pg (ref 26.0–34.0)
MCHC: 33.6 g/dL (ref 32.0–36.0)
MCV: 85.8 fL (ref 80.0–100.0)
Monocytes Absolute: 0.6 10*3/uL (ref 0.2–1.0)
Monocytes Relative: 10 %
Neutro Abs: 4.4 10*3/uL (ref 1.4–6.5)
Neutrophils Relative %: 76 %
Platelets: 195 10*3/uL (ref 150–440)
RBC: 4.89 MIL/uL (ref 4.40–5.90)
RDW: 14.2 % (ref 11.5–14.5)
WBC: 5.7 10*3/uL (ref 3.8–10.6)

## 2015-07-08 LAB — C DIFFICILE QUICK SCREEN W PCR REFLEX
C Diff antigen: NEGATIVE
C Diff interpretation: NEGATIVE
C Diff toxin: NEGATIVE

## 2015-07-08 LAB — OCCULT BLOOD X 1 CARD TO LAB, STOOL: Fecal Occult Bld: NEGATIVE

## 2015-07-08 NOTE — ED Notes (Signed)
Patient c/o diarrhea x 2 days. Per patient was given medications for a foot problem. Per pt. Was given Rx. Clindamycin 300 mg and indomethacin 50mg . Per patient believe medication causing him his diarrhea.Per pt. Watery tarry stool.

## 2015-07-08 NOTE — Discharge Instructions (Signed)

## 2015-07-08 NOTE — ED Provider Notes (Signed)
CSN: [id number]     Arrival date & time 07/08/15  1034 History   First MD Initiated Contact with Patient 07/08/15 1238     Chief Complaint  Patient presents with  . Diarrhea   (Consider location/radiation/quality/duration/timing/severity/associated sxs/prior Treatment) HPI   Very pleasant 60 year old male who was just finished a 10 day course of clindamycin and 15 days of indomethacin because of a an infection of his lower right leg. The infection cleared up but 2 days ago he began to experiencing watery diarrhea that was very dark in color. Since turned into more of a tarry type stool. He's had no frank blood or mucus. He denies any abdominal pain nausea vomiting. He has not been febrile. He has since stopped taking the indomethacin indomethacin even had one more dose to take.  Past Medical History  Diagnosis Date  . Hypertension   . Sleep apnea   . Prostate enlargement   . Trigger finger   . Abnormal glucose    Past Surgical History  Procedure Laterality Date  . Vasectomy  1982  . Hernia repair  [id number]    umbilical  . Colonoscopy  2006   Family History  Problem Relation Age of Onset  . Heart disease Father   . Diabetes Mother   . Hypertension Mother   . CAD Mother   . Hyperlipidemia Mother   . CAD Father   . Diabetes Father    Social History  Substance Use Topics  . Smoking status: Former Smoker -- 1.00 packs/day for 24 years    Types: Cigarettes  . Smokeless tobacco: None  . Alcohol Use: 0.0 oz/week    0 Standard drinks or equivalent per week     Comment: Occasionally    Review of Systems  Constitutional: Negative for fever, chills, activity change, appetite change and fatigue.  Gastrointestinal: Positive for diarrhea. Negative for nausea, vomiting, abdominal pain, blood in stool, abdominal distention, anal bleeding and rectal pain.  All other systems reviewed and are negative.   Allergies  Review of patient's allergies indicates no known allergies.  Home  Medications   Prior to Admission medications   Medication Sig Start Date End Date Taking? Authorizing Provider  fluticasone [facility] ALLERGY RELIEF) 50 MCG/ACT nasal spray Place 2 sprays into the nose daily. 06/07/14  Yes Historical Provider, MD  indomethacin (INDOCIN) 50 MG capsule Take 1 capsule (50 mg total) by mouth 2 (two) times daily with a meal. 06/21/15  Yes Ashok Norris, MD  lisinopril-hydrochlorothiazide (PRINZIDE,ZESTORETIC) 20-25 MG tablet Take 1 tablet by mouth daily. 06/05/15  Yes Ashok Norris, MD  Omega-3 Fatty Acids (FISH OIL) 1200 MG CAPS Take 1 capsule by mouth 2 (two) times daily.   Yes Historical Provider, MD  clindamycin (CLEOCIN) 300 MG capsule Take 1 capsule (300 mg total) by mouth 3 (three) times daily. 06/21/15   Ashok Norris, MD  polyethylene glycol powder (GLYCOLAX/MIRALAX) powder 255 grams one bottle for colonoscopy prep 06/21/15   Robert Bellow, MD   Meds Ordered and Administered this Visit  Medications - No data to display  BP 132/68 mmHg  Pulse 71  Temp(Src) 98.3 F (36.8 C) (Oral)  Resp 18  Ht 5\' 10"  (1.778 m)  Wt 260 lb (117.935 kg)  BMI 37.31 kg/m2  SpO2 97% No data found.   Physical Exam  Constitutional: He is oriented to person, place, and time. He appears well-developed and well-nourished. No distress.  HENT:  Head: Normocephalic and atraumatic.  Eyes: Conjunctivae are normal. Pupils are  equal, round, and reactive to light.  Neck: Normal range of motion. Neck supple.  Pulmonary/Chest: Breath sounds normal. No respiratory distress. He has no wheezes. He has no rales.  Abdominal: Soft. He exhibits distension. He exhibits no mass. There is no tenderness. There is no rebound and no guarding.  Examination of the abdomen was mild distention. The patient does have a large ventral hernia present. Sounds are hypertonic. Patient does have a tympanitic percussion note. There is no tenderness present. There is no rebound or guarding present. Rectal  was not performed as the sample of stool he provided provide Hemoccult results  Musculoskeletal: Normal range of motion. He exhibits no edema or tenderness.  Lymphadenopathy:    He has no cervical adenopathy.  Neurological: He is alert and oriented to person, place, and time.  Skin: Skin is warm and dry. He is not diaphoretic.  Psychiatric: He has a normal mood and affect. His behavior is normal. Judgment and thought content normal.  Nursing note and vitals reviewed.   ED Course  Procedures (including critical care time)  Labs Review Labs Reviewed  CBC WITH DIFFERENTIAL/PLATELET - Abnormal; Notable for the following:    Lymphs Abs 0.7 (*)    All other components within normal limits  COMPREHENSIVE METABOLIC PANEL - Abnormal; Notable for the following:    Potassium 3.4 (*)    Chloride 99 (*)    Glucose, Bld 108 (*)    AST 108 (*)    ALT 137 (*)    All other components within normal limits  C DIFFICILE QUICK SCREEN W PCR REFLEX  GASTROINTESTINAL PANEL BY PCR, STOOL (REPLACES STOOL CULTURE)  OCCULT BLOOD X 1 CARD TO LAB, STOOL    Imaging Review No results found.   Visual Acuity Review  Right Eye Distance:   Left Eye Distance:   Bilateral Distance:    Right Eye Near:   Left Eye Near:    Bilateral Near:         MDM   1. Diarrhea due to drug    Discharge Medication List as of 07/08/2015  2:03 PM    Plan: 1. Test/x-ray results and diagnosis reviewed with patient 2. rx as per orders; risks, benefits, potential side effects reviewed with patient 3. Recommend supportive treatment with fluids and continue the probiotics. He'll call 24 hours for results of the additional test. Shorten that there is no blood in the stool is H&H was normal. Take any further of the medications. He'll contact his primary care physician in the same problems 4. F/u prn if symptoms worsen or don't improve    Lorin Picket, PA-C 07/08/15 1409

## 2015-07-09 ENCOUNTER — Telehealth: Payer: Self-pay

## 2015-07-09 ENCOUNTER — Ambulatory Visit: Payer: BLUE CROSS/BLUE SHIELD | Admitting: Family Medicine

## 2015-07-09 LAB — GASTROINTESTINAL PANEL BY PCR, STOOL (REPLACES STOOL CULTURE)
Adenovirus F40/41: NOT DETECTED
Astrovirus: NOT DETECTED
Campylobacter species: NOT DETECTED
Cryptosporidium: NOT DETECTED
Cyclospora cayetanensis: NOT DETECTED
E. coli O157: NOT DETECTED
Entamoeba histolytica: NOT DETECTED
Enteroaggregative E coli (EAEC): NOT DETECTED
Enteropathogenic E coli (EPEC): NOT DETECTED
Enterotoxigenic E coli (ETEC): NOT DETECTED
Giardia lamblia: NOT DETECTED
Norovirus GI/GII: DETECTED — AB
Plesimonas shigelloides: NOT DETECTED
Rotavirus A: NOT DETECTED
Salmonella species: NOT DETECTED
Sapovirus (I, II, IV, and V): NOT DETECTED
Shiga like toxin producing E coli (STEC): NOT DETECTED
Shigella/Enteroinvasive E coli (EIEC): NOT DETECTED
Vibrio cholerae: NOT DETECTED
Vibrio species: NOT DETECTED
Yersinia enterocolitica: NOT DETECTED

## 2015-07-09 NOTE — ED Notes (Signed)
Patient returned telephone call stating "feeling much better". After verifying ID, result of + Norovirus given to patient. States will continue ProBiotics and keeping hydrated

## 2015-07-11 ENCOUNTER — Encounter: Payer: Self-pay | Admitting: Family Medicine

## 2015-07-11 ENCOUNTER — Ambulatory Visit (INDEPENDENT_AMBULATORY_CARE_PROVIDER_SITE_OTHER): Payer: BLUE CROSS/BLUE SHIELD | Admitting: Family Medicine

## 2015-07-11 VITALS — BP 126/64 | HR 63 | Temp 97.9°F | Resp 16 | Wt 258.0 lb

## 2015-07-11 DIAGNOSIS — I10 Essential (primary) hypertension: Secondary | ICD-10-CM

## 2015-07-11 DIAGNOSIS — Z1389 Encounter for screening for other disorder: Secondary | ICD-10-CM

## 2015-07-11 DIAGNOSIS — R7309 Other abnormal glucose: Secondary | ICD-10-CM

## 2015-07-11 DIAGNOSIS — R972 Elevated prostate specific antigen [PSA]: Secondary | ICD-10-CM | POA: Insufficient documentation

## 2015-07-11 DIAGNOSIS — M79671 Pain in right foot: Secondary | ICD-10-CM | POA: Diagnosis not present

## 2015-07-11 DIAGNOSIS — A0811 Acute gastroenteropathy due to Norwalk agent: Secondary | ICD-10-CM

## 2015-07-11 MED ORDER — LISINOPRIL-HYDROCHLOROTHIAZIDE 20-25 MG PO TABS: 1.0000 | ORAL_TABLET | Freq: Every day | ORAL | Status: DC

## 2015-07-11 NOTE — Progress Notes (Signed)
Name: Eric English.   MRN: [id number]    DOB: 1955/08/05   Date:07/11/2015       Progress Note  Subjective  Chief Complaint  Chief Complaint  Patient presents with  . Medication Refill  . Hypertension  . Gout    had a flare in right ankle went to [facility] urgent care, but med was giving him diarrhea.    HPI  Eric English is a 60 year old male patient of Dr. Serita Grit Morrisey's but due to unexpected absence of his PCP he is here to see me for his previously scheduled appointment. 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 Today he reports having a possible cellulitis with gout flare in the right ankle a few weeks ago but the medications prescribed for him (clindamycin and indomethacin) caused diarrhea bowel movements so he discontinued the medication after being seen at [facility] on 07/08/15. Tests for C.diff, and stool blood were negative. However review of his stool studies shows positive Norovirus infection. GI issues now resolved.   For his HTN he is currently on Lisinopril-HCTZ 20-25 mg one a day with no concerns or side effects.   Past Medical History  Diagnosis Date  . Hypertension   . Sleep apnea   . Prostate enlargement   . Trigger finger   . Abnormal glucose     Patient Active Problem List   Diagnosis Date Noted  . Encounter for screening colonoscopy 06/23/2015  . Superficial thrombophlebitis 06/23/2015  . Foot pain, right 06/21/2015  . Allergic rhinitis 12/12/2014  . Abnormal prostate specific antigen 12/12/2014  . Annual physical exam 12/12/2014  . Diastasis recti 12/27/2013  . Abnormal blood sugar 03/15/2010  . Benign essential HTN 03/15/2010  . Accumulation of fluid in tissues 03/03/2009    Social History  Substance Use Topics  . Smoking status: Former Smoker -- 1.00 packs/day for 24 years    Types: Cigarettes  . Smokeless tobacco: Not on file  . Alcohol Use: 0.0 oz/week    0 Standard drinks or equivalent per week     Comment:  Occasionally     Current outpatient prescriptions:  .  fluticasone (FLONASE ALLERGY RELIEF) 50 MCG/ACT nasal spray, Place 2 sprays into the nose daily., Disp: , Rfl:  .  indomethacin (INDOCIN) 50 MG capsule, Take 1 capsule (50 mg total) by mouth 2 (two) times daily with a meal., Disp: 30 capsule, Rfl: 0 .  lisinopril-hydrochlorothiazide (PRINZIDE,ZESTORETIC) 20-25 MG tablet, Take 1 tablet by mouth daily., Disp: 90 tablet, Rfl: 1 .  Omega-3 Fatty Acids (FISH OIL) 1200 MG CAPS, Take 1 capsule by mouth 2 (two) times daily., Disp: , Rfl:   Past Surgical History  Procedure Laterality Date  . Vasectomy  1982  . Hernia repair  3354    umbilical  . Colonoscopy  2006    Family History  Problem Relation Age of Onset  . Heart disease Father   . Diabetes Mother   . Hypertension Mother   . CAD Mother   . Hyperlipidemia Mother   . CAD Father   . Diabetes Father     No Known Allergies   Review of Systems  CONSTITUTIONAL: No significant weight changes, fever, chills, weakness or fatigue.  HEENT:  - Eyes: No visual changes.  - Ears: No auditory changes. No pain.  - Nose: No sneezing, congestion, runny nose. - Throat: No sore throat. No changes in swallowing. SKIN: Right ankle skin area peeling  CARDIOVASCULAR: No  chest pain, chest pressure or chest discomfort. No palpitations or edema.  RESPIRATORY: No shortness of breath, cough or sputum.  GASTROINTESTINAL: No anorexia, nausea, vomiting. No changes in bowel habits. No abdominal pain or blood.  GENITOURINARY: No dysuria. No frequency. No discharge. NEUROLOGICAL: No headache, dizziness, syncope, paralysis, ataxia, numbness or tingling in the extremities. No memory changes. No change in bowel or bladder control.  MUSCULOSKELETAL: No joint pain. No muscle pain. HEMATOLOGIC: No anemia, bleeding or bruising.  LYMPHATICS: No enlarged lymph nodes.  PSYCHIATRIC: No change in mood. No change in sleep pattern.  ENDOCRINOLOGIC: No reports of  sweating, cold or heat intolerance. No polyuria or polydipsia.     Objective  BP 126/64 mmHg  Pulse 63  Temp(Src) 97.9 F (36.6 C) (Oral)  Resp 16  Wt 258 lb (117.028 kg)  SpO2 96% Body mass index is 37.02 kg/(m^2).  Physical Exam  Constitutional: Patient is obese and well-nourished. In no distress.   Cardiovascular: Normal rate, regular rhythm and normal heart sounds.  No murmur heard.  Pulmonary/Chest: Effort normal and breath sounds normal. No respiratory distress. Musculoskeletal: Normal range of motion bilateral UE and LE, no joint effusions. Peripheral vascular: Bilateral LE no edema. Right ankle up to mid medial shin peeling skin w/o erythema/edema/redness Neurological: CN II-XII grossly intact with no focal deficits. Alert and oriented to person, place, and time. Coordination, balance, strength, speech and gait are normal.  Skin: Skin is warm and dry. No rash noted. No erythema.  Psychiatric: Patient has his usually happy talkative mood and affect. Behavior is normal in office today. Judgment and thought content normal in office today.   Recent Results (from the past 2160 hour(s))  CBC     Status: None   Collection Time: 06/21/15 10:44 AM  Result Value Ref Range   WBC 7.7 3.4 - 10.8 x10E3/uL   RBC 4.68 4.14 - 5.80 x10E6/uL   Hemoglobin 13.7 12.6 - 17.7 g/dL   Hematocrit 40.2 37.5 - 51.0 %   MCV 86 79 - 97 fL   MCH 29.3 26.6 - 33.0 pg   MCHC 34.1 31.5 - 35.7 g/dL   RDW 13.7 12.3 - 15.4 %   Platelets 294 150 - 379 x10E3/uL  Uric acid     Status: None   Collection Time: 06/21/15 10:44 AM  Result Value Ref Range   Uric Acid 7.7 3.7 - 8.6 mg/dL    Comment:            Therapeutic target for gout patients: <6.0  Sed Rate (ESR)     Status: None   Collection Time: 06/21/15 10:44 AM  Result Value Ref Range   Sed Rate 14 0 - 30 mm/hr  Gastrointestinal Panel by PCR , Stool     Status: Abnormal   Collection Time: 07/08/15 12:50 PM  Result Value Ref Range   Campylobacter  species NOT DETECTED NOT DETECTED   Plesimonas shigelloides NOT DETECTED NOT DETECTED   Salmonella species NOT DETECTED NOT DETECTED   Yersinia enterocolitica NOT DETECTED NOT DETECTED   Vibrio species NOT DETECTED NOT DETECTED   Vibrio cholerae NOT DETECTED NOT DETECTED   Enteroaggregative E coli (EAEC) NOT DETECTED NOT DETECTED   Enteropathogenic E coli (EPEC) NOT DETECTED NOT DETECTED   Enterotoxigenic E coli (ETEC) NOT DETECTED NOT DETECTED   Shiga like toxin producing E coli (STEC) NOT DETECTED NOT DETECTED   E. coli O157 NOT DETECTED NOT DETECTED   Shigella/Enteroinvasive E coli (EIEC) NOT DETECTED NOT DETECTED  Cryptosporidium NOT DETECTED NOT DETECTED   Cyclospora cayetanensis NOT DETECTED NOT DETECTED   Entamoeba histolytica NOT DETECTED NOT DETECTED   Giardia lamblia NOT DETECTED NOT DETECTED   Adenovirus F40/41 NOT DETECTED NOT DETECTED   Astrovirus NOT DETECTED NOT DETECTED   Norovirus GI/GII DETECTED (A) NOT DETECTED    Comment: CRITICAL RESULT CALLED TO, READ BACK BY AND VERIFIED WITH: STEVE HOLMES, RN ([employer]) AT 0830 ON 07/09/15. CTJ    Rotavirus A NOT DETECTED NOT DETECTED   Sapovirus (I, II, IV, and V) NOT DETECTED NOT DETECTED  Occult blood card to lab, stool Provider will collect     Status: None   Collection Time: 07/08/15 12:50 PM  Result Value Ref Range   Fecal Occult Bld NEGATIVE NEGATIVE  C difficile quick scan w PCR reflex     Status: None   Collection Time: 07/08/15 12:50 PM  Result Value Ref Range   C Diff antigen NEGATIVE NEGATIVE   C Diff toxin NEGATIVE NEGATIVE   C Diff interpretation Negative for C. difficile   CBC with Differential     Status: Abnormal   Collection Time: 07/08/15  1:14 PM  Result Value Ref Range   WBC 5.7 3.8 - 10.6 K/uL   RBC 4.89 4.40 - 5.90 MIL/uL   Hemoglobin 14.1 13.0 - 18.0 g/dL   HCT 41.9 40.0 - 52.0 %   MCV 85.8 80.0 - 100.0 fL   MCH 28.8 26.0 - 34.0 pg   MCHC 33.6 32.0 - 36.0 g/dL   RDW 14.2 11.5 - 14.5 %   Platelets  195 150 - 440 K/uL   Neutrophils Relative % 76 %   Neutro Abs 4.4 1.4 - 6.5 K/uL   Lymphocytes Relative 12 %   Lymphs Abs 0.7 (L) 1.0 - 3.6 K/uL   Monocytes Relative 10 %   Monocytes Absolute 0.6 0.2 - 1.0 K/uL   Eosinophils Relative 2 %   Eosinophils Absolute 0.1 0 - 0.7 K/uL   Basophils Relative 0 %   Basophils Absolute 0.0 0 - 0.1 K/uL  Comprehensive metabolic panel     Status: Abnormal   Collection Time: 07/08/15  1:14 PM  Result Value Ref Range   Sodium 136 135 - 145 mmol/L   Potassium 3.4 (L) 3.5 - 5.1 mmol/L   Chloride 99 (L) 101 - 111 mmol/L   CO2 28 22 - 32 mmol/L   Glucose, Bld 108 (H) 65 - 99 mg/dL   BUN 13 6 - 20 mg/dL   Creatinine, Ser 0.91 0.61 - 1.24 mg/dL   Calcium 9.2 8.9 - 10.3 mg/dL   Total Protein 8.0 6.5 - 8.1 g/dL   Albumin 4.0 3.5 - 5.0 g/dL   AST 108 (H) 15 - 41 U/L   ALT 137 (H) 17 - 63 U/L   Alkaline Phosphatase 58 38 - 126 U/L   Total Bilirubin 0.9 0.3 - 1.2 mg/dL   GFR calc non Af Amer >60 >60 mL/min   GFR calc Af Amer >60 >60 mL/min    Comment: (NOTE) The eGFR has been calculated using the CKD EPI equation. This calculation has not been validated in all clinical situations. eGFR's persistently <60 mL/min signify possible Chronic Kidney Disease.    Anion gap 9 5 - 15     Assessment & Plan  1. Foot pain, right Resolved. Will get uric acid levels to investigate into chronic gout propensity.  - Uric acid  2. Screening for gout  - Uric acid  3.  Benign essential HTN Well controled. Refilled.  - CBC with Differential/Platelet - Comprehensive metabolic panel - lisinopril-hydrochlorothiazide (PRINZIDE,ZESTORETIC) 20-25 MG tablet; Take 1 tablet by mouth daily.  Dispense: 90 tablet; Refill: 1  4. Gastroenteritis due to norovirus Resolved.  - CBC with Differential/Platelet - Comprehensive metabolic panel  5. Abnormal blood sugar Rule out DM II diagnosis as fasting glucose slightly elevated and he has had cellulitis.   - Hemoglobin  A1c

## 2015-07-12 LAB — CBC WITH DIFFERENTIAL/PLATELET
BASOS: 1 %
Basophils Absolute: 0.1 10*3/uL (ref 0.0–0.2)
EOS (ABSOLUTE): 0.1 10*3/uL (ref 0.0–0.4)
EOS: 1 %
HEMATOCRIT: 39.9 % (ref 37.5–51.0)
HEMOGLOBIN: 13.5 g/dL (ref 12.6–17.7)
IMMATURE GRANS (ABS): 0 10*3/uL (ref 0.0–0.1)
Immature Granulocytes: 0 %
Lymphocytes Absolute: 1.9 10*3/uL (ref 0.7–3.1)
Lymphs: 32 %
MCH: 28.8 pg (ref 26.6–33.0)
MCHC: 33.8 g/dL (ref 31.5–35.7)
MCV: 85 fL (ref 79–97)
MONOS ABS: 0.6 10*3/uL (ref 0.1–0.9)
Monocytes: 11 %
NEUTROS ABS: 3.3 10*3/uL (ref 1.4–7.0)
Neutrophils: 55 %
Platelets: 292 10*3/uL (ref 150–379)
RBC: 4.68 x10E6/uL (ref 4.14–5.80)
RDW: 14.5 % (ref 12.3–15.4)
WBC: 6 10*3/uL (ref 3.4–10.8)

## 2015-07-12 LAB — COMPREHENSIVE METABOLIC PANEL
A/G RATIO: 1.3 (ref 1.1–2.5)
ALBUMIN: 4.2 g/dL (ref 3.6–4.8)
ALT: 168 IU/L — ABNORMAL HIGH (ref 0–44)
AST: 40 IU/L (ref 0–40)
Alkaline Phosphatase: 77 IU/L (ref 39–117)
BUN / CREAT RATIO: 13 (ref 10–22)
BUN: 13 mg/dL (ref 8–27)
Bilirubin Total: 0.3 mg/dL (ref 0.0–1.2)
CALCIUM: 9.6 mg/dL (ref 8.6–10.2)
CO2: 26 mmol/L (ref 18–29)
CREATININE: 0.99 mg/dL (ref 0.76–1.27)
Chloride: 95 mmol/L — ABNORMAL LOW (ref 96–106)
GFR, EST AFRICAN AMERICAN: 95 mL/min/{1.73_m2} (ref >59)
GFR, EST NON AFRICAN AMERICAN: 82 mL/min/{1.73_m2} (ref >59)
GLOBULIN, TOTAL: 3.3 g/dL (ref 1.5–4.5)
Glucose: 106 mg/dL — ABNORMAL HIGH (ref 65–99)
POTASSIUM: 3.9 mmol/L (ref 3.5–5.2)
SODIUM: 140 mmol/L (ref 134–144)
Total Protein: 7.5 g/dL (ref 6.0–8.5)

## 2015-07-12 LAB — HEMOGLOBIN A1C
ESTIMATED AVERAGE GLUCOSE: 137 mg/dL
Hgb A1c MFr Bld: 6.4 % — ABNORMAL HIGH (ref 4.8–5.6)

## 2015-07-12 LAB — URIC ACID: Uric Acid: 8 mg/dL (ref 3.7–8.6)

## 2015-08-08 ENCOUNTER — Telehealth: Payer: Self-pay | Admitting: *Deleted

## 2015-08-08 NOTE — Telephone Encounter (Signed)
Patient was contacted today and confirms no medication changes since last office visit. He states he has picked up Miralax prescription.   We will proceed with colonoscopy as scheduled for 08-15-15 at [facility].   This patient was instructed to call the office should he have further questions.

## 2015-08-14 ENCOUNTER — Encounter: Payer: Self-pay | Admitting: *Deleted

## 2015-08-15 ENCOUNTER — Ambulatory Visit
Admission: RE | Admit: 2015-08-15 | Discharge: 2015-08-15 | Disposition: A | Discharge status: Home / Self Care | Payer: BLUE CROSS/BLUE SHIELD | Source: Ambulatory Visit | Attending: General Surgery | Admitting: General Surgery

## 2015-08-15 ENCOUNTER — Ambulatory Visit: Payer: BLUE CROSS/BLUE SHIELD | Admitting: Anesthesiology

## 2015-08-15 ENCOUNTER — Encounter
Admission: RE | Disposition: A | Discharge status: Home / Self Care | Payer: Self-pay | Source: Ambulatory Visit | Attending: General Surgery

## 2015-08-15 ENCOUNTER — Encounter: Payer: Self-pay | Admitting: *Deleted

## 2015-08-15 DIAGNOSIS — Z87891 Personal history of nicotine dependence: Secondary | ICD-10-CM | POA: Insufficient documentation

## 2015-08-15 DIAGNOSIS — Z9889 Other specified postprocedural states: Secondary | ICD-10-CM | POA: Diagnosis not present

## 2015-08-15 DIAGNOSIS — Z79899 Other long term (current) drug therapy: Secondary | ICD-10-CM | POA: Insufficient documentation

## 2015-08-15 DIAGNOSIS — Z7951 Long term (current) use of inhaled steroids: Secondary | ICD-10-CM | POA: Diagnosis not present

## 2015-08-15 DIAGNOSIS — N400 Enlarged prostate without lower urinary tract symptoms: Secondary | ICD-10-CM | POA: Diagnosis not present

## 2015-08-15 DIAGNOSIS — D125 Benign neoplasm of sigmoid colon: Secondary | ICD-10-CM | POA: Insufficient documentation

## 2015-08-15 DIAGNOSIS — G4730 Sleep apnea, unspecified: Secondary | ICD-10-CM | POA: Diagnosis not present

## 2015-08-15 DIAGNOSIS — Z9852 Vasectomy status: Secondary | ICD-10-CM | POA: Diagnosis not present

## 2015-08-15 DIAGNOSIS — Z1211 Encounter for screening for malignant neoplasm of colon: Secondary | ICD-10-CM | POA: Insufficient documentation

## 2015-08-15 DIAGNOSIS — I10 Essential (primary) hypertension: Secondary | ICD-10-CM | POA: Diagnosis not present

## 2015-08-15 HISTORY — PX: COLONOSCOPY WITH PROPOFOL: SHX5780

## 2015-08-15 SURGERY — COLONOSCOPY WITH PROPOFOL
Anesthesia: General

## 2015-08-15 MED ORDER — FENTANYL CITRATE (PF) 100 MCG/2ML IJ SOLN
INTRAMUSCULAR | Status: DC | PRN
  Administered 2015-08-15: 50 ug via INTRAVENOUS

## 2015-08-15 MED ORDER — SODIUM CHLORIDE 0.9 % IV SOLN
INTRAVENOUS | Status: DC
  Administered 2015-08-15: 09:00:00 via INTRAVENOUS
  Administered 2015-08-15: 1000 mL via INTRAVENOUS

## 2015-08-15 MED ORDER — PROPOFOL 10 MG/ML IV BOLUS
INTRAVENOUS | Status: DC | PRN
  Administered 2015-08-15: 59.9 mg via INTRAVENOUS

## 2015-08-15 MED ORDER — MIDAZOLAM HCL 5 MG/5ML IJ SOLN
INTRAMUSCULAR | Status: DC | PRN
  Administered 2015-08-15: 1 mg via INTRAVENOUS

## 2015-08-15 MED ORDER — PROPOFOL 500 MG/50ML IV EMUL
INTRAVENOUS | Status: DC | PRN
  Administered 2015-08-15: 140 ug/kg/min via INTRAVENOUS

## 2015-08-15 NOTE — Anesthesia Preprocedure Evaluation (Addendum)
Anesthesia Evaluation  Patient identified by MRN, date of birth, ID band Patient awake    Reviewed: Allergy & Precautions, NPO status , Patient's Chart, lab work & pertinent test results  History of Anesthesia Complications Negative for: history of anesthetic complications  Airway Mallampati: III       Dental   Pulmonary sleep apnea , former smoker,           Cardiovascular hypertension, Pt. on medications      Neuro/Psych negative neurological ROS     GI/Hepatic negative GI ROS, Neg liver ROS,   Endo/Other    Renal/GU negative Renal ROS     Musculoskeletal negative musculoskeletal ROS (+)   Abdominal   Peds  Hematology negative hematology ROS (+)   Anesthesia Other Findings   Reproductive/Obstetrics                            Anesthesia Physical Anesthesia Plan  ASA: II  Anesthesia Plan: General   Post-op Pain Management:    Induction: Intravenous  Airway Management Planned: Nasal Cannula  Additional Equipment:   Intra-op Plan:   Post-operative Plan:   Informed Consent: I have reviewed the patients History and Physical, chart, labs and discussed the procedure including the risks, benefits and alternatives for the proposed anesthesia with the patient or authorized representative who has indicated his/her understanding and acceptance.     Plan Discussed with:   Anesthesia Plan Comments:         Anesthesia Quick Evaluation

## 2015-08-15 NOTE — Anesthesia Postprocedure Evaluation (Signed)
Anesthesia Post Note  Patient: Eric English.  Procedure(s) Performed: Procedure(s) (LRB): COLONOSCOPY WITH PROPOFOL (N/A)  Patient location during evaluation: Endoscopy Anesthesia Type: General Level of consciousness: awake and alert Pain management: pain level controlled Vital Signs Assessment: post-procedure vital signs reviewed and stable Respiratory status: spontaneous breathing and respiratory function stable Cardiovascular status: stable Anesthetic complications: no    Last Vitals:  Filed Vitals:   08/15/15 0814  BP: 126/73  Pulse: 66  Temp: 36.6 C  Resp: 16    Last Pain: There were no vitals filed for this visit.               KEPHART,WILLIAM K

## 2015-08-15 NOTE — H&P (Signed)
Lakshya Martinie [id number] December 17, 1955     HPI: Candidate for screening colonoscopy.   Prescriptions prior to admission  Medication Sig Dispense Refill Last Dose  . lisinopril-hydrochlorothiazide (PRINZIDE,ZESTORETIC) 20-25 MG tablet Take 1 tablet by mouth daily. 90 tablet 1 08/15/2015 at 0600  . Omega-3 Fatty Acids (FISH OIL) 1200 MG CAPS Take 1 capsule by mouth 2 (two) times daily.   Taking  . [DISCONTINUED] fluticasone (FLONASE ALLERGY RELIEF) 50 MCG/ACT nasal spray Place 2 sprays into the nose daily.   Taking  . [DISCONTINUED] indomethacin (INDOCIN) 50 MG capsule Take 1 capsule (50 mg total) by mouth 2 (two) times daily with a meal. 30 capsule 0 Taking   No Known Allergies Past Medical History  Diagnosis Date  . Hypertension   . Sleep apnea   . Prostate enlargement   . Trigger finger   . Abnormal glucose    Past Surgical History  Procedure Laterality Date  . Vasectomy  1982  . Hernia repair  [id number]    umbilical  . Colonoscopy  2006   Social History   Social History  . Marital Status: Married    Spouse Name: N/A  . Number of Children: N/A  . Years of Education: N/A   Occupational History  .  [place] History Main Topics  . Smoking status: Former Smoker -- 1.00 packs/day for 24 years    Types: Cigarettes  . Smokeless tobacco: Not on file  . Alcohol Use: 0.0 oz/week    0 Standard drinks or equivalent per week     Comment: Occasionally  . Drug Use: No  . Sexual Activity: Not on file   Other Topics Concern  . Not on file   Social History Narrative   Social History   Social History Narrative     ROS: Negative.     PE: HEENT: Negative. Lungs: Clear. Cardio: RR. Robert Bellow 08/15/2015   Assessment/Plan:  Proceed with planned endoscopy.

## 2015-08-15 NOTE — Transfer of Care (Signed)
Immediate Anesthesia Transfer of Care Note  Patient: Eric English.  Procedure(s) Performed: Procedure(s): COLONOSCOPY WITH PROPOFOL (N/A)  Patient Location: PACU  Anesthesia Type:General  Level of Consciousness: sedated  Airway & Oxygen Therapy: Patient Spontanous Breathing  Post-op Assessment: Report given to RN  Post vital signs: Reviewed  Last Vitals:  Filed Vitals:   08/15/15 0814  BP: 126/73  Pulse: 66  Temp: 36.6 C  Resp: 16    Complications: No apparent anesthesia complications

## 2015-08-15 NOTE — Op Note (Signed)
[facility] Gastroenterology Patient Name: Eric English Procedure Date: 08/15/2015 8:54 AM MRN: [id number] Account #: [id number] Date of Birth: 1955/09/29 Admit Type: Outpatient Age: 60 Room: [facility] ENDO ROOM 1 Gender: Male Note Status: Finalized Procedure:            Colonoscopy Indications:          Screening for colorectal malignant neoplasm Providers:            Robert Bellow, MD Referring MD:         Ashok Norris, MD (Referring MD) Medicines:            Monitored Anesthesia Care Complications:        No immediate complications. Procedure:            Pre-Anesthesia Assessment:                       - Prior to the procedure, a History and Physical was                        performed, and patient medications, allergies and                        sensitivities were reviewed. The patient's tolerance of                        previous anesthesia was reviewed.                       - The risks and benefits of the procedure and the                        sedation options and risks were discussed with the                        patient. All questions were answered and informed                        consent was obtained.                       After obtaining informed consent, the colonoscope was                        passed under direct vision. Throughout the procedure,                        the patient's blood pressure, pulse, and oxygen                        saturations were monitored continuously. The Olympus                        CF-H180AL colonoscope ( S#: [id number] ) was introduced                        through the anus and advanced to the the cecum,                        identified by appendiceal orifice and ileocecal valve.  The colonoscopy was performed with difficulty due to                        poor bowel prep and significant looping. Successful                        completion of the procedure was aided by using manual                        pressure. The patient tolerated the procedure well. The                        quality of the bowel preparation was adequate to                        identify polyps 6 mm and larger in size. Findings:      A 10 mm polyp was found in the sigmoid colon. The polyp was       semi-pedunculated. The polyp was removed with a hot snare. Resection and       retrieval were complete.      The retroflexed view of the distal rectum and anal verge was normal and       showed no anal or rectal abnormalities. Impression:           - One 10 mm polyp in the sigmoid colon, removed with a                        hot snare. Resected and retrieved.                       - The distal rectum and anal verge are normal on                        retroflexion view. Recommendation:       - Telephone endoscopist for pathology results in 1 week. Procedure Code(s):    --- Professional ---                       [phone], Colonoscopy, flexible; with removal of tumor(s),                        polyp(s), or other lesion(s) by snare technique Diagnosis Code(s):    --- Professional ---                       D12.5, Benign neoplasm of sigmoid colon                       Z12.11, Encounter for screening for malignant neoplasm                        of colon CPT copyright 2016 American Medical Association. All rights reserved. The codes documented in this report are preliminary and upon coder review may  be revised to meet current compliance requirements. Robert Bellow, MD 08/15/2015 9:37:33 AM This report has been signed electronically. Number of Addenda: 0 Note Initiated On: 08/15/2015 8:54 AM Scope Withdrawal Time: 0 hours 12 minutes 31 seconds  Total Procedure Duration: 0 hours 31 minutes 29 seconds       [facility]  Center

## 2015-08-16 ENCOUNTER — Encounter: Payer: Self-pay | Admitting: General Surgery

## 2015-08-16 LAB — SURGICAL PATHOLOGY

## 2015-08-18 ENCOUNTER — Telehealth: Payer: Self-pay | Admitting: General Surgery

## 2015-08-18 NOTE — Telephone Encounter (Signed)
A message was left for the patient that the polyp removed at his recent colonoscopy was benign. He should plan on a 5 year follow-up.

## 2015-10-08 ENCOUNTER — Encounter: Payer: Self-pay | Admitting: Family Medicine

## 2015-10-08 ENCOUNTER — Ambulatory Visit (INDEPENDENT_AMBULATORY_CARE_PROVIDER_SITE_OTHER): Payer: BLUE CROSS/BLUE SHIELD | Admitting: Family Medicine

## 2015-10-08 VITALS — BP 120/71 | HR 98 | Temp 98.4°F | Resp 18 | Ht 70.0 in | Wt 245.8 lb

## 2015-10-08 DIAGNOSIS — R748 Abnormal levels of other serum enzymes: Secondary | ICD-10-CM | POA: Diagnosis not present

## 2015-10-08 DIAGNOSIS — N528 Other male erectile dysfunction: Secondary | ICD-10-CM | POA: Diagnosis not present

## 2015-10-08 DIAGNOSIS — N529 Male erectile dysfunction, unspecified: Secondary | ICD-10-CM

## 2015-10-08 MED ORDER — SILDENAFIL CITRATE 25 MG PO TABS: 25.0000 mg | ORAL_TABLET | ORAL | Status: DC | PRN

## 2015-10-08 NOTE — Progress Notes (Signed)
Name: Eric English.   MRN: [id number]    DOB: 1956/02/19   Date:10/08/2015       Progress Note  Subjective  Chief Complaint  Chief Complaint  Patient presents with  . Advice Only    Erectile Dysfunction This is a new problem. Episode onset: about a year. The problem is unchanged. The nature of his difficulty is achieving erection, maintaining erection and penetration. Irritative symptoms do not include frequency. Obstructive symptoms do not include a slower stream or a weak stream. Pertinent negatives include no chills, dysuria or hematuria. Past treatments include nothing. Risk factors include BPH (Has history of elevated PSA< followed by Urology at [facility], Bx was reportedly benign.).    Past Medical History  Diagnosis Date  . Hypertension   . Sleep apnea   . Prostate enlargement   . Trigger finger   . Abnormal glucose     Past Surgical History  Procedure Laterality Date  . Vasectomy  1982  . Hernia repair  [id number]    umbilical  . Colonoscopy  2006  . Colonoscopy with propofol N/A 08/15/2015    Procedure: COLONOSCOPY WITH PROPOFOL;  Surgeon: Robert Bellow, MD;  Location: [facility] ENDOSCOPY;  Service: Endoscopy;  Laterality: N/A;    Family History  Problem Relation Age of Onset  . Heart disease Father   . Diabetes Mother   . Hypertension Mother   . CAD Mother   . Hyperlipidemia Mother   . CAD Father   . Diabetes Father     Social History   Social History  . Marital Status: Married    Spouse Name: N/A  . Number of Children: N/A  . Years of Education: N/A   Occupational History  .  [place] History Main Topics  . Smoking status: Former Smoker -- 1.00 packs/day for 24 years    Types: Cigarettes  . Smokeless tobacco: Not on file  . Alcohol Use: 0.0 oz/week    0 Standard drinks or equivalent per week     Comment: Occasionally  . Drug Use: No  . Sexual Activity: Not on file   Other Topics Concern  . Not on file   Social History Narrative      Current outpatient prescriptions:  .  lisinopril-hydrochlorothiazide (PRINZIDE,ZESTORETIC) 20-25 MG tablet, Take 1 tablet by mouth daily., Disp: 90 tablet, Rfl: 1 .  Omega-3 Fatty Acids (FISH OIL) 1200 MG CAPS, Take 1 capsule by mouth 2 (two) times daily., Disp: , Rfl:   No Known Allergies   Review of Systems  Constitutional: Negative for chills.  Genitourinary: Negative for dysuria, frequency and hematuria.   Objective  Filed Vitals:   10/08/15 1436  BP: 120/71  Pulse: 98  Temp: 98.4 F (36.9 C)  TempSrc: Oral  Resp: 18  Height: 5\' 10"  (1.778 m)  Weight: 245 lb 12.8 oz (111.494 kg)  SpO2: 95%    Physical Exam  Constitutional: He is oriented to person, place, and time and well-developed, well-nourished, and in no distress.  HENT:  Head: Normocephalic and atraumatic.  Cardiovascular: Normal rate and regular rhythm.   Pulmonary/Chest: Effort normal and breath sounds normal.  Neurological: He is alert and oriented to person, place, and time.  Nursing note and vitals reviewed.     Assessment & Plan  1. Erectile dysfunction of organic origin No irritative or voiding symptoms. Has history of low testosterone in the past and on therapy, which was stopped by urology after prostate enlargement. Since  then, has noticed a decline in erectile function. We'll start on low-dose Viagra. Advised on potential adverse effects, educated on symptoms and follow-up in one month. - sildenafil (VIAGRA) 25 MG tablet; Take 1 tablet (25 mg total) by mouth as needed for erectile dysfunction.  Dispense: 10 tablet; Refill: 0  2. Elevated liver enzymes ALT is elevated at 168 in January 2017. We will repeat today and if still elevated, will obtain further workup. - Comprehensive Metabolic Panel (CMET)   Jeslyn Amsler Asad A. [facility] 10/08/2015 2:42 PM

## 2015-10-09 LAB — COMPREHENSIVE METABOLIC PANEL
A/G RATIO: 1.3 (ref 1.2–2.2)
ALT: 24 IU/L (ref 0–44)
AST: 21 IU/L (ref 0–40)
Albumin: 4.3 g/dL (ref 3.6–4.8)
Alkaline Phosphatase: 66 IU/L (ref 39–117)
BUN/Creatinine Ratio: 11 (ref 10–24)
BUN: 11 mg/dL (ref 8–27)
Bilirubin Total: 0.3 mg/dL (ref 0.0–1.2)
CALCIUM: 9.7 mg/dL (ref 8.6–10.2)
CO2: 27 mmol/L (ref 18–29)
CREATININE: 0.99 mg/dL (ref 0.76–1.27)
Chloride: 96 mmol/L (ref 96–106)
GFR, EST AFRICAN AMERICAN: 95 mL/min/{1.73_m2} (ref >59)
GFR, EST NON AFRICAN AMERICAN: 82 mL/min/{1.73_m2} (ref >59)
GLUCOSE: 120 mg/dL — AB (ref 65–99)
Globulin, Total: 3.2 g/dL (ref 1.5–4.5)
POTASSIUM: 4.2 mmol/L (ref 3.5–5.2)
Sodium: 141 mmol/L (ref 134–144)
TOTAL PROTEIN: 7.5 g/dL (ref 6.0–8.5)

## 2015-12-03 ENCOUNTER — Telehealth: Payer: Self-pay | Admitting: Family Medicine

## 2015-12-04 ENCOUNTER — Encounter: Payer: Self-pay | Admitting: Family Medicine

## 2015-12-04 ENCOUNTER — Ambulatory Visit (INDEPENDENT_AMBULATORY_CARE_PROVIDER_SITE_OTHER): Payer: BLUE CROSS/BLUE SHIELD | Admitting: Family Medicine

## 2015-12-04 VITALS — BP 117/73 | HR 79 | Temp 98.0°F | Resp 16 | Ht 70.0 in | Wt 239.7 lb

## 2015-12-04 DIAGNOSIS — I10 Essential (primary) hypertension: Secondary | ICD-10-CM | POA: Diagnosis not present

## 2015-12-04 MED ORDER — LISINOPRIL-HYDROCHLOROTHIAZIDE 20-25 MG PO TABS: 1.0000 | ORAL_TABLET | Freq: Every day | ORAL | Status: DC

## 2015-12-04 NOTE — Progress Notes (Signed)
Name: Eric English.   MRN: [id number]    DOB: [birth year]   Date:12/04/2015       Progress Note  Subjective  Chief Complaint  Chief Complaint  Patient presents with  . Medication Refill    lisinopril 20-25 mg     Hypertension This is a chronic problem. The problem is unchanged. The problem is controlled. Pertinent negatives include no blurred vision, chest pain, headaches or palpitations. Past treatments include ACE inhibitors and diuretics. There is no history of kidney disease, CAD/MI or CVA.    Past Medical History  Diagnosis Date  . Hypertension   . Sleep apnea   . Prostate enlargement   . Trigger finger   . Abnormal glucose     Past Surgical History  Procedure Laterality Date  . Hernia repair  [id number]    umbilical  . Colonoscopy  2006  . Colonoscopy with propofol N/A 08/15/2015    Procedure: COLONOSCOPY WITH PROPOFOL;  Surgeon: Robert Bellow, MD;  Location: [facility] ENDOSCOPY;  Service: Endoscopy;  Laterality: N/A;  . Vasectomy  1982    Family History  Problem Relation Age of Onset  . Heart disease Father   . Diabetes Mother   . Hypertension Mother   . CAD Mother   . Hyperlipidemia Mother   . CAD Father   . Diabetes Father     Social History   Social History  . Marital Status: Married    Spouse Name: N/A  . Number of Children: N/A  . Years of Education: N/A   Occupational History  .  [place] History Main Topics  . Smoking status: Former Smoker -- 1.00 packs/day for 24 years    Types: Cigarettes  . Smokeless tobacco: Not on file  . Alcohol Use: 0.0 oz/week    0 Standard drinks or equivalent per week     Comment: Occasionally  . Drug Use: No  . Sexual Activity: Not on file   Other Topics Concern  . Not on file   Social History Narrative     Current outpatient prescriptions:  .  lisinopril-hydrochlorothiazide (PRINZIDE,ZESTORETIC) 20-25 MG tablet, Take 1 tablet by mouth daily., Disp: 90 tablet, Rfl: 1 .  Omega-3 Fatty  Acids (FISH OIL) 1200 MG CAPS, Take 1 capsule by mouth 2 (two) times daily., Disp: , Rfl:  .  sildenafil (VIAGRA) 25 MG tablet, Take 1 tablet (25 mg total) by mouth as needed for erectile dysfunction., Disp: 10 tablet, Rfl: 0  No Known Allergies   Review of Systems  Eyes: Negative for blurred vision.  Cardiovascular: Negative for chest pain and palpitations.  Neurological: Negative for headaches.    Objective  Filed Vitals:   12/04/15 1058  BP: 117/73  Pulse: 79  Temp: 98 F (36.7 C)  TempSrc: Oral  Resp: 16  Height: 5\' 10"  (1.778 m)  Weight: 239 lb 11.2 oz (108.727 kg)  SpO2: 93%    Physical Exam  Constitutional: He is oriented to person, place, and time and well-developed, well-nourished, and in no distress.  HENT:  Head: Normocephalic and atraumatic.  Cardiovascular: Normal rate, regular rhythm and normal heart sounds.   No murmur heard. Pulmonary/Chest: Effort normal and breath sounds normal. He has no wheezes.  Neurological: He is alert and oriented to person, place, and time.  Nursing note and vitals reviewed.    Assessment & Plan  1. Benign essential HTN BP stable and controlled on present antihypertensive therapy. - lisinopril-hydrochlorothiazide (PRINZIDE,ZESTORETIC) 20-25 MG  tablet; Take 1 tablet by mouth daily.  Dispense: 90 tablet; Refill: 1   Sho Salguero Asad A. [facility] 12/04/2015 11:25 AM

## 2015-12-12 NOTE — Telephone Encounter (Signed)
errenous °

## 2016-01-09 ENCOUNTER — Ambulatory Visit: Payer: BLUE CROSS/BLUE SHIELD | Admitting: Family Medicine

## 2016-03-13 DIAGNOSIS — H5213 Myopia, bilateral: Secondary | ICD-10-CM | POA: Diagnosis not present

## 2016-06-05 ENCOUNTER — Ambulatory Visit: Payer: BLUE CROSS/BLUE SHIELD | Admitting: Family Medicine

## 2016-06-18 ENCOUNTER — Encounter: Payer: Self-pay | Admitting: Family Medicine

## 2016-06-18 ENCOUNTER — Ambulatory Visit (INDEPENDENT_AMBULATORY_CARE_PROVIDER_SITE_OTHER): Payer: BLUE CROSS/BLUE SHIELD | Admitting: Family Medicine

## 2016-06-18 VITALS — BP 118/74 | HR 90 | Temp 99.2°F | Resp 17 | Ht 70.0 in | Wt 246.0 lb

## 2016-06-18 DIAGNOSIS — N529 Male erectile dysfunction, unspecified: Secondary | ICD-10-CM | POA: Diagnosis not present

## 2016-06-18 DIAGNOSIS — J069 Acute upper respiratory infection, unspecified: Secondary | ICD-10-CM | POA: Diagnosis not present

## 2016-06-18 DIAGNOSIS — Z23 Encounter for immunization: Secondary | ICD-10-CM

## 2016-06-18 DIAGNOSIS — I10 Essential (primary) hypertension: Secondary | ICD-10-CM | POA: Diagnosis not present

## 2016-06-18 MED ORDER — LISINOPRIL-HYDROCHLOROTHIAZIDE 20-25 MG PO TABS: 1.0000 | ORAL_TABLET | Freq: Every day | ORAL | 1 refills | Status: DC

## 2016-06-18 MED ORDER — SILDENAFIL CITRATE 25 MG PO TABS: 25.0000 mg | ORAL_TABLET | ORAL | 0 refills | Status: DC | PRN

## 2016-06-18 MED ORDER — IBUPROFEN 200 MG PO TABS: 200.0000 mg | ORAL_TABLET | Freq: Four times a day (QID) | ORAL | 0 refills | Status: DC | PRN

## 2016-06-18 MED ORDER — GUAIFENESIN ER 600 MG PO TB12: 600.0000 mg | ORAL_TABLET | Freq: Two times a day (BID) | ORAL | 0 refills | Status: DC

## 2016-06-18 NOTE — Progress Notes (Signed)
Name: Eric English.   MRN: [id number]    DOB: February 06, 1956   Date:06/18/2016       Progress Note  Subjective  Chief Complaint  Chief Complaint  Patient presents with  . Follow-up    6 mo  . Medication Refill    Hypertension  This is a chronic problem. The problem is unchanged. The problem is controlled. Pertinent negatives include no blurred vision, chest pain, headaches, palpitations or shortness of breath. Past treatments include ACE inhibitors and diuretics. There is no history of kidney disease, CAD/MI or CVA.  URI   This is a new problem. The current episode started in the past 7 days. The problem has been unchanged. There has been no fever. Associated symptoms include congestion, sinus pain and a sore throat. Pertinent negatives include no abdominal pain, chest pain, coughing, headaches, nausea or vomiting. He has tried nothing for the symptoms.     Past Medical History:  Diagnosis Date  . Abnormal glucose   . Hypertension   . Prostate enlargement   . Sleep apnea   . Trigger finger     Past Surgical History:  Procedure Laterality Date  . COLONOSCOPY  2006  . COLONOSCOPY WITH PROPOFOL N/A 08/15/2015   Procedure: COLONOSCOPY WITH PROPOFOL;  Surgeon: Robert Bellow, MD;  Location: [facility] ENDOSCOPY;  Service: Endoscopy;  Laterality: N/A;  . HERNIA REPAIR  [id number]   umbilical  . VASECTOMY  [id number]    Family History  Problem Relation Age of Onset  . Diabetes Mother   . Hypertension Mother   . CAD Mother   . Hyperlipidemia Mother   . Heart disease Father   . CAD Father   . Diabetes Father     Social History   Social History  . Marital status: Married    Spouse name: N/A  . Number of children: N/A  . Years of education: N/A   Occupational History  .  [place] History Main Topics  . Smoking status: Former Smoker    Packs/day: 1.00    Years: 24.00    Types: Cigarettes  . Smokeless tobacco: Never Used  . Alcohol use 0.0 oz/week     Comment:  Occasionally  . Drug use: No  . Sexual activity: Not on file   Other Topics Concern  . Not on file   Social History Narrative  . No narrative on file     Current Outpatient Prescriptions:  .  lisinopril-hydrochlorothiazide (PRINZIDE,ZESTORETIC) 20-25 MG tablet, Take 1 tablet by mouth daily., Disp: 90 tablet, Rfl: 1 .  Omega-3 Fatty Acids (FISH OIL) 1200 MG CAPS, Take 1 capsule by mouth 2 (two) times daily., Disp: , Rfl:  .  sildenafil (VIAGRA) 25 MG tablet, Take 1 tablet (25 mg total) by mouth as needed for erectile dysfunction., Disp: 10 tablet, Rfl: 0  No Known Allergies   Review of Systems  Constitutional: Positive for chills. Negative for fever.  HENT: Positive for congestion, sinus pain and sore throat.   Eyes: Negative for blurred vision.  Respiratory: Negative for cough, sputum production and shortness of breath.   Cardiovascular: Negative for chest pain and palpitations.  Gastrointestinal: Negative for abdominal pain, nausea and vomiting.  Neurological: Negative for headaches.     Objective  Vitals:   06/18/16 1425  BP: 118/74  Pulse: 90  Resp: 17  Temp: 99.2 F (37.3 C)  TempSrc: Oral  SpO2: 96%  Weight: 246 lb (111.6 kg)  Height: 5\' 10"  (  1.778 m)    Physical Exam  Constitutional: He is oriented to person, place, and time and well-developed, well-nourished, and in no distress.  HENT:  Head: Normocephalic and atraumatic.  Right Ear: Tympanic membrane and ear canal normal. No drainage or swelling.  Left Ear: Tympanic membrane and ear canal normal. No drainage or swelling.  Mouth/Throat: Posterior oropharyngeal erythema present. No oropharyngeal exudate or posterior oropharyngeal edema.  Cardiovascular: Normal rate, regular rhythm, S1 normal, S2 normal and normal heart sounds.   No murmur heard. Pulmonary/Chest: Effort normal and breath sounds normal. He has no wheezes. He has no rhonchi.  Abdominal: Soft. Bowel sounds are normal.  Neurological: He is  alert and oriented to person, place, and time.  Psychiatric: Mood, memory, affect and judgment normal.  Nursing note and vitals reviewed.       Assessment & Plan  1. Need for immunization against influenza  - Flu Vaccine QUAD 36+ mos PF IM (Fluarix & Fluzone Quad PF)  2. Benign essential HTN  - lisinopril-hydrochlorothiazide (PRINZIDE,ZESTORETIC) 20-25 MG tablet; Take 1 tablet by mouth daily.  Dispense: 90 tablet; Refill: 1  3. Erectile dysfunction of organic origin  - sildenafil (VIAGRA) 25 MG tablet; Take 1 tablet (25 mg total) by mouth as needed for erectile dysfunction.  Dispense: 10 tablet; Refill: 0  4. URI with cough and congestion  - ibuprofen (ADVIL,MOTRIN) 200 MG tablet; Take 1 tablet (200 mg total) by mouth every 6 (six) hours as needed.  Dispense: 30 tablet; Refill: 0 - guaiFENesin (MUCINEX) 600 MG 12 hr tablet; Take 1 tablet (600 mg total) by mouth 2 (two) times daily.  Dispense: 20 tablet; Refill: 0    Patrik Turnbaugh Asad A. [facility] 06/18/2016 3:08 PM

## 2016-07-01 ENCOUNTER — Ambulatory Visit
Admission: EM | Admit: 2016-07-01 | Discharge: 2016-07-01 | Disposition: A | Discharge status: Home / Self Care | Payer: BLUE CROSS/BLUE SHIELD | Attending: Emergency Medicine | Admitting: Emergency Medicine

## 2016-07-01 ENCOUNTER — Encounter: Payer: Self-pay | Admitting: *Deleted

## 2016-07-01 DIAGNOSIS — K643 Fourth degree hemorrhoids: Secondary | ICD-10-CM | POA: Diagnosis not present

## 2016-07-01 DIAGNOSIS — K640 First degree hemorrhoids: Secondary | ICD-10-CM

## 2016-07-01 HISTORY — DX: Unspecified hemorrhoids: K64.9

## 2016-07-01 LAB — OCCULT BLOOD X 1 CARD TO LAB, STOOL: Fecal Occult Bld: NEGATIVE

## 2016-07-01 MED ORDER — HYDROCORTISONE 1 % EX CREA: TOPICAL_CREAM | CUTANEOUS | 0 refills | Status: DC

## 2016-07-01 NOTE — ED Provider Notes (Signed)
CSN: [id number]     Arrival date & time 07/01/16  1303 History   First MD Initiated Contact with Patient 07/01/16 1612     Chief Complaint  Patient presents with  . Rectal Bleeding   (Consider location/radiation/quality/duration/timing/severity/associated sxs/prior Treatment) HPI  This 61 year old male who presents with bloody stools 4 days. Patient has known hemorrhoids from previous colonoscopy. Also had a hemorrhoid that has been rather large that is now "not there" as it had been in the past. He states that he still has blood in the toilet after a bowel movement but that it has lessened. He had a hard stool when it first occurred but his stools have been normal since that time. He said no nausea vomiting. No diarrhea.No abdominal pain and no rectal pain.      Past Medical History:  Diagnosis Date  . Abnormal glucose   . Hemorrhoids   . Hypertension   . Prostate enlargement   . Sleep apnea   . Trigger finger    Past Surgical History:  Procedure Laterality Date  . COLONOSCOPY  2006  . COLONOSCOPY WITH PROPOFOL N/A 08/15/2015   Procedure: COLONOSCOPY WITH PROPOFOL;  Surgeon: Robert Bellow, MD;  Location: [facility] ENDOSCOPY;  Service: Endoscopy;  Laterality: N/A;  . HERNIA REPAIR  [id number]   umbilical  . VASECTOMY  [id number]   Family History  Problem Relation Age of Onset  . Diabetes Mother   . Hypertension Mother   . CAD Mother   . Hyperlipidemia Mother   . Heart disease Father   . CAD Father   . Diabetes Father    Social History  Substance Use Topics  . Smoking status: Former Smoker    Packs/day: 1.00    Years: 24.00    Types: Cigarettes  . Smokeless tobacco: Never Used  . Alcohol use 0.0 oz/week     Comment: Occasionally    Review of Systems  Constitutional: Negative for activity change, appetite change, chills, fatigue and fever.  Gastrointestinal: Positive for blood in stool.  All other systems reviewed and are negative.   Allergies  Patient has no known  allergies.  Home Medications   Prior to Admission medications   Medication Sig Start Date End Date Taking? Authorizing Provider  lisinopril-hydrochlorothiazide (PRINZIDE,ZESTORETIC) 20-25 MG tablet Take 1 tablet by mouth daily. 06/18/16  Yes Roselee Nova, MD  Omega-3 Fatty Acids (FISH OIL) 1200 MG CAPS Take 1 capsule by mouth 2 (two) times daily.   Yes Historical Provider, MD  sildenafil (VIAGRA) 25 MG tablet Take 1 tablet (25 mg total) by mouth as needed for erectile dysfunction. 06/18/16  Yes Roselee Nova, MD  guaiFENesin (MUCINEX) 600 MG 12 hr tablet Take 1 tablet (600 mg total) by mouth 2 (two) times daily. 06/18/16   Roselee Nova, MD  hydrocortisone cream 1 % Apply to affected area 2 times daily 07/01/16   Lorin Picket, PA-C  ibuprofen (ADVIL,MOTRIN) 200 MG tablet Take 1 tablet (200 mg total) by mouth every 6 (six) hours as needed. 06/18/16   Roselee Nova, MD   Meds Ordered and Administered this Visit  Medications - No data to display  BP 133/72 (BP Location: Left Arm)   Pulse 63   Temp 98.7 F (37.1 C) (Oral)   Resp 16   Ht 5\' 10"  (1.778 m)   Wt 239 lb (108.4 kg)   SpO2 100%   BMI 34.29 kg/m  No data found.   Physical  Exam  Constitutional: He is oriented to person, place, and time. He appears well-developed and well-nourished. No distress.  HENT:  Head: Normocephalic and atraumatic.  Eyes: EOM are normal. Pupils are equal, round, and reactive to light. Right eye exhibits no discharge. Left eye exhibits no discharge.  Neck: Normal range of motion. Neck supple.  Abdominal: Soft. Bowel sounds are normal. He exhibits no distension and no mass. There is no tenderness. There is no guarding.  Genitourinary: Rectal exam shows guaiac negative stool.  Genitourinary Comments: Examination of the rectal area shows a large external hemorrhoid nonthrombosed at the 3:00 position. There is no fissuring seen. No prolapse of internal hemorrhoids is observed with straining. Rectal  exam was carried out with no masses felt. Prostate  is large and boggy but without any hard areas.  Musculoskeletal: Normal range of motion.  Lymphadenopathy:    He has no cervical adenopathy.  Neurological: He is alert and oriented to person, place, and time.  Skin: Skin is warm and dry. He is not diaphoretic.  Psychiatric: He has a normal mood and affect. His behavior is normal. Judgment and thought content normal.  Nursing note and vitals reviewed.   Urgent Care Course   Clinical Course     Procedures (including critical care time)  Labs Review Labs Reviewed  OCCULT BLOOD X 1 CARD TO LAB, STOOL    Imaging Review No results found.   Visual Acuity Review  Right Eye Distance:   Left Eye Distance:   Bilateral Distance:    Right Eye Near:   Left Eye Near:    Bilateral Near:         MDM   1. Grade I hemorrhoids    Discharge Medication List as of 07/01/2016  5:25 PM    START taking these medications   Details  hydrocortisone cream 1 % Apply to affected area 2 times daily, Normal      Plan: 1. Test/x-ray results and diagnosis reviewed with patient 2. rx as per orders; risks, benefits, potential side effects reviewed with patient 3. Recommend supportive treatment with life style changes to include not sitting on toilet for long periods of time and using Metamucil or other fiber in the diet. Given him some hydrocortisone cream to use externally and he may purchase over-the-counter suppositories for internal use. He should follow-up with his primary care physician next week. 4. F/u prn if symptoms worsen or don't improve     Lorin Picket, PA-C 07/01/16 Ruth Shianne Zeiser, PA-C 07/01/16 1801

## 2016-07-01 NOTE — ED Triage Notes (Signed)
Bloody stools since last Friday, bright red blood. Hx of hemorrhoids.

## 2016-07-01 NOTE — Discharge Instructions (Signed)
Use Metamucil 2-3 times daily. Limit time sitting on the toilet. Use external hemorrhoid cream prescribed for you twice daily. Follow-up with your primary care physician next week

## 2016-07-10 ENCOUNTER — Ambulatory Visit: Payer: BLUE CROSS/BLUE SHIELD | Admitting: Family Medicine

## 2016-07-11 ENCOUNTER — Ambulatory Visit: Payer: BLUE CROSS/BLUE SHIELD | Admitting: Family Medicine

## 2016-07-15 ENCOUNTER — Ambulatory Visit (INDEPENDENT_AMBULATORY_CARE_PROVIDER_SITE_OTHER): Payer: BLUE CROSS/BLUE SHIELD | Admitting: Family Medicine

## 2016-07-15 ENCOUNTER — Encounter: Payer: Self-pay | Admitting: Family Medicine

## 2016-07-15 VITALS — BP 110/84 | HR 85 | Temp 98.1°F | Resp 18 | Ht 70.0 in | Wt 241.2 lb

## 2016-07-15 DIAGNOSIS — K625 Hemorrhage of anus and rectum: Secondary | ICD-10-CM

## 2016-07-15 DIAGNOSIS — J0100 Acute maxillary sinusitis, unspecified: Secondary | ICD-10-CM

## 2016-07-15 DIAGNOSIS — K640 First degree hemorrhoids: Secondary | ICD-10-CM | POA: Diagnosis not present

## 2016-07-15 DIAGNOSIS — K649 Unspecified hemorrhoids: Secondary | ICD-10-CM | POA: Insufficient documentation

## 2016-07-15 MED ORDER — FLUTICASONE PROPIONATE 50 MCG/ACT NA SUSP: 2.0000 | Freq: Every day | NASAL | 0 refills | Status: DC

## 2016-07-15 MED ORDER — AMOXICILLIN-POT CLAVULANATE 875-125 MG PO TABS
1.0000 | ORAL_TABLET | Freq: Two times a day (BID) | ORAL | 0 refills | Status: DC
Start: 2016-07-15 — End: 2016-09-19

## 2016-07-15 NOTE — Progress Notes (Signed)
Name: Eric English.   MRN: [id number]    DOB: 16-Nov-1955   Date:07/15/2016       Progress Note  Subjective  Chief Complaint  Chief Complaint  Patient presents with  . Rectal Bleeding    seen at UC follow up     Sinusitis  This is a new problem. The current episode started in the past 7 days (3-4 days ago). There has been no fever. Associated symptoms include congestion, headaches and sinus pressure. Pertinent negatives include no chills, coughing, shortness of breath or sore throat. Past treatments include oral decongestants (Mucinex). The treatment provided moderate relief.    Pt. Presents for follow up from Urgent Care, he was seen for complaints of frank bright red blood in stools, no pain in rectum or  abdomen, he was seen at urgent care and diagnosed as Grade 1 hemorrhoids, started on hydrocortisone cream and advised to use Metamucil as stool softener.  Patient has been doing as instructed and his stools have been regular, has not seen blood in stools during last three BMs, no pain.    Past Medical History:  Diagnosis Date  . Abnormal glucose   . Hemorrhoids   . Hypertension   . Prostate enlargement   . Sleep apnea   . Trigger finger     Past Surgical History:  Procedure Laterality Date  . COLONOSCOPY  2006  . COLONOSCOPY WITH PROPOFOL N/A 08/15/2015   Procedure: COLONOSCOPY WITH PROPOFOL;  Surgeon: Robert Bellow, MD;  Location: [facility] ENDOSCOPY;  Service: Endoscopy;  Laterality: N/A;  . HERNIA REPAIR  [id number]   umbilical  . VASECTOMY  [id number]    Family History  Problem Relation Age of Onset  . Diabetes Mother   . Hypertension Mother   . CAD Mother   . Hyperlipidemia Mother   . Heart disease Father   . CAD Father   . Diabetes Father     Social History   Social History  . Marital status: Married    Spouse name: N/A  . Number of children: N/A  . Years of education: N/A   Occupational History  .  [employer] History Main Topics  . Smoking  status: Former Smoker    Packs/day: 1.00    Years: 24.00    Types: Cigarettes  . Smokeless tobacco: Never Used  . Alcohol use 0.0 oz/week     Comment: Occasionally  . Drug use: No  . Sexual activity: Not on file   Other Topics Concern  . Not on file   Social History Narrative  . No narrative on file     Current Outpatient Prescriptions:  .  hydrocortisone cream 1 %, Apply to affected area 2 times daily, Disp: 15 g, Rfl: 0 .  ibuprofen (ADVIL,MOTRIN) 200 MG tablet, Take 1 tablet (200 mg total) by mouth every 6 (six) hours as needed., Disp: 30 tablet, Rfl: 0 .  lisinopril-hydrochlorothiazide (PRINZIDE,ZESTORETIC) 20-25 MG tablet, Take 1 tablet by mouth daily., Disp: 90 tablet, Rfl: 1 .  Omega-3 Fatty Acids (FISH OIL) 1200 MG CAPS, Take 1 capsule by mouth 2 (two) times daily., Disp: , Rfl:  .  sildenafil (VIAGRA) 25 MG tablet, Take 1 tablet (25 mg total) by mouth as needed for erectile dysfunction., Disp: 10 tablet, Rfl: 0  No Known Allergies   Review of Systems  Constitutional: Negative for chills, fever, malaise/fatigue and weight loss.  HENT: Positive for congestion and sinus pressure. Negative for sore throat.  Respiratory: Negative for cough and shortness of breath.   Gastrointestinal: Positive for blood in stool (now resolved). Negative for abdominal pain, constipation, diarrhea, nausea and vomiting.  Genitourinary: Negative for dysuria and hematuria.  Neurological: Positive for headaches.    Objective  Vitals:   07/15/16 1005  BP: 110/84  Pulse: 85  Resp: 18  Temp: 98.1 F (36.7 C)  TempSrc: Oral  SpO2: 98%  Weight: 241 lb 3.2 oz (109.4 kg)  Height: 5\' 10"  (1.778 m)    Physical Exam  Constitutional: He is oriented to person, place, and time and well-developed, well-nourished, and in no distress.  HENT:  Nose: Right sinus exhibits maxillary sinus tenderness. Right sinus exhibits no frontal sinus tenderness. Left sinus exhibits maxillary sinus tenderness.  Left sinus exhibits no frontal sinus tenderness.  Mouth/Throat: Posterior oropharyngeal erythema present.  Nasal turbinate hypertrophied and inflamed.   Cardiovascular: Normal rate, regular rhythm, S1 normal, S2 normal and normal heart sounds.   No murmur heard. Pulmonary/Chest: Effort normal and breath sounds normal. No respiratory distress. He has no wheezes. He has no rhonchi.  Genitourinary: Prostate normal. Rectal exam shows external hemorrhoid. Rectal exam shows no internal hemorrhoid, no fissure, no laceration, no mass and guaiac negative stool. Prostate is not tender.  Neurological: He is alert and oriented to person, place, and time.  Psychiatric: Mood, memory, affect and judgment normal.  Nursing note and vitals reviewed.     Assessment & Plan  1. Grade I hemorrhoids  resolved, continue on Metamucil.  2. BRBPR (bright red blood per rectum)  no more episodes of blood per rectum, stool Hemeoccult obtained today in office is negative.  3. Acute non-recurrent maxillary sinusitis  - fluticasone (FLONASE) 50 MCG/ACT nasal spray; Place 2 sprays into both nostrils daily.  Dispense: 16 g; Refill: 0 - amoxicillin-clavulanate (AUGMENTIN) 875-125 MG tablet; Take 1 tablet by mouth 2 (two) times daily.  Dispense: 20 tablet; Refill: 0   Ayleah Hofmeister [facility] 07/15/2016 10:16 AM

## 2016-09-19 ENCOUNTER — Encounter: Payer: Self-pay | Admitting: Family Medicine

## 2016-09-19 ENCOUNTER — Ambulatory Visit (INDEPENDENT_AMBULATORY_CARE_PROVIDER_SITE_OTHER): Payer: BLUE CROSS/BLUE SHIELD | Admitting: Family Medicine

## 2016-09-19 VITALS — BP 112/72 | HR 67 | Temp 98.2°F | Resp 16 | Ht 70.0 in | Wt 244.6 lb

## 2016-09-19 DIAGNOSIS — J301 Allergic rhinitis due to pollen: Secondary | ICD-10-CM | POA: Diagnosis not present

## 2016-09-19 DIAGNOSIS — N529 Male erectile dysfunction, unspecified: Secondary | ICD-10-CM

## 2016-09-19 DIAGNOSIS — I10 Essential (primary) hypertension: Secondary | ICD-10-CM | POA: Diagnosis not present

## 2016-09-19 MED ORDER — TADALAFIL 5 MG PO TABS: 5.0000 mg | ORAL_TABLET | Freq: Every day | ORAL | 0 refills | Status: DC | PRN

## 2016-09-19 MED ORDER — FLUTICASONE PROPIONATE 50 MCG/ACT NA SUSP: 2.0000 | Freq: Every day | NASAL | 2 refills | Status: DC

## 2016-09-19 MED ORDER — LISINOPRIL-HYDROCHLOROTHIAZIDE 20-25 MG PO TABS: 1.0000 | ORAL_TABLET | Freq: Every day | ORAL | 1 refills | Status: DC

## 2016-09-19 NOTE — Progress Notes (Signed)
Name: Eric English.   MRN: [id number]    DOB: Mar 06, 1956   Date:09/19/2016       Progress Note  Subjective  Chief Complaint  Chief Complaint  Patient presents with  . Follow-up    3 mo  . Medication Refill    Hypertension  This is a chronic problem. The problem is unchanged. The problem is controlled. Pertinent negatives include no blurred vision, chest pain, headaches, palpitations or shortness of breath. Past treatments include ACE inhibitors and diuretics.  Erectile Dysfunction  This is a chronic problem. The problem is unchanged. The nature of his difficulty is achieving erection and maintaining erection. Past treatments include sildenafil. Medication reaction: headaches, GI distress and dizziness.   Schendt is also requesting prescription for Flonase that he takes during the allergy season, allergy to pollen, symptoms include sneezing, nasal congestion etc.  Past Medical History:  Diagnosis Date  . Abnormal glucose   . Hemorrhoids   . Hypertension   . Prostate enlargement   . Sleep apnea   . Trigger finger     Past Surgical History:  Procedure Laterality Date  . COLONOSCOPY  2006  . COLONOSCOPY WITH PROPOFOL N/A 08/15/2015   Procedure: COLONOSCOPY WITH PROPOFOL;  Surgeon: Robert Bellow, MD;  Location: [facility] ENDOSCOPY;  Service: Endoscopy;  Laterality: N/A;  . HERNIA REPAIR  1448   umbilical  . VASECTOMY  1856    Family History  Problem Relation Age of Onset  . Diabetes Mother   . Hypertension Mother   . CAD Mother   . Hyperlipidemia Mother   . Heart disease Father   . CAD Father   . Diabetes Father     Social History   Social History  . Marital status: Married    Spouse name: N/A  . Number of children: N/A  . Years of education: N/A   Occupational History  .  [place] History Main Topics  . Smoking status: Former Smoker    Packs/day: 1.00    Years: 24.00    Types: Cigarettes  . Smokeless tobacco: Never Used  . Alcohol use  0.0 oz/week     Comment: Occasionally  . Drug use: No  . Sexual activity: Not on file   Other Topics Concern  . Not on file   Social History Narrative  . No narrative on file     Current Outpatient Prescriptions:  .  fluticasone (FLONASE) 50 MCG/ACT nasal spray, Place 2 sprays into both nostrils daily., Disp: 16 g, Rfl: 0 .  hydrocortisone cream 1 %, Apply to affected area 2 times daily, Disp: 15 g, Rfl: 0 .  ibuprofen (ADVIL,MOTRIN) 200 MG tablet, Take 1 tablet (200 mg total) by mouth every 6 (six) hours as needed., Disp: 30 tablet, Rfl: 0 .  lisinopril-hydrochlorothiazide (PRINZIDE,ZESTORETIC) 20-25 MG tablet, Take 1 tablet by mouth daily., Disp: 90 tablet, Rfl: 1 .  Omega-3 Fatty Acids (FISH OIL) 1200 MG CAPS, Take 1 capsule by mouth 2 (two) times daily., Disp: , Rfl:  .  sildenafil (VIAGRA) 25 MG tablet, Take 1 tablet (25 mg total) by mouth as needed for erectile dysfunction., Disp: 10 tablet, Rfl: 0 .  amoxicillin-clavulanate (AUGMENTIN) 875-125 MG tablet, Take 1 tablet by mouth 2 (two) times daily. (Patient not taking: Reported on 09/19/2016), Disp: 20 tablet, Rfl: 0  No Known Allergies   Review of Systems  Eyes: Negative for blurred vision.  Respiratory: Negative for shortness of breath.   Cardiovascular: Negative  for chest pain and palpitations.  Neurological: Negative for headaches.     Objective  Vitals:   09/19/16 0849  BP: 112/72  Pulse: 67  Resp: 16  Temp: 98.2 F (36.8 C)  TempSrc: Oral  SpO2: 96%  Weight: 244 lb 9.6 oz (110.9 kg)  Height: 5\' 10"  (1.778 m)    Physical Exam  Constitutional: He is oriented to person, place, and time and well-developed, well-nourished, and in no distress.  HENT:  Head: Normocephalic and atraumatic.  Mouth/Throat: Posterior oropharyngeal erythema present.  Nasal turbinate hypertrophy and mucosal inflammation  Cardiovascular: Normal rate, regular rhythm and normal heart sounds.   No murmur heard. Pulmonary/Chest: Effort  normal and breath sounds normal. He has no wheezes.  Musculoskeletal:       Right ankle: He exhibits no swelling.       Left ankle: He exhibits no swelling.  Neurological: He is alert and oriented to person, place, and time.  Nursing note and vitals reviewed.    Assessment & Plan  1. Benign essential HTN BP stable on present antihypertensive therapy - lisinopril-hydrochlorothiazide (PRINZIDE,ZESTORETIC) 20-25 MG tablet; Take 1 tablet by mouth daily.  Dispense: 90 tablet; Refill: 1  2. Chronic seasonal allergic rhinitis due to pollen Recommended to take Allegra for relief of allergic symptoms along with Flonase - fluticasone (FLONASE) 50 MCG/ACT nasal spray; Place 2 sprays into both nostrils daily.  Dispense: 16 g; Refill: 2  3. Erectile dysfunction, unspecified erectile dysfunction type DC sildenafil and change to Cialis 5 mg taken as needed for erectile dysfunction. Follow-up in 3 months. - tadalafil (CIALIS) 5 MG tablet; Take 1 tablet (5 mg total) by mouth daily as needed for erectile dysfunction.  Dispense: 30 tablet; Refill: 0  Maliea Grandmaison [facility] 09/19/2016 9:17 AM

## 2017-01-02 ENCOUNTER — Ambulatory Visit: Payer: BLUE CROSS/BLUE SHIELD | Admitting: Family Medicine

## 2017-01-09 ENCOUNTER — Ambulatory Visit (INDEPENDENT_AMBULATORY_CARE_PROVIDER_SITE_OTHER): Payer: BLUE CROSS/BLUE SHIELD | Admitting: Family Medicine

## 2017-01-09 ENCOUNTER — Encounter: Payer: Self-pay | Admitting: Family Medicine

## 2017-01-09 VITALS — BP 112/68 | HR 104 | Temp 97.9°F | Resp 17 | Ht 70.0 in | Wt 248.0 lb

## 2017-01-09 DIAGNOSIS — N529 Male erectile dysfunction, unspecified: Secondary | ICD-10-CM | POA: Diagnosis not present

## 2017-01-09 DIAGNOSIS — L918 Other hypertrophic disorders of the skin: Secondary | ICD-10-CM | POA: Diagnosis not present

## 2017-01-09 DIAGNOSIS — I10 Essential (primary) hypertension: Secondary | ICD-10-CM

## 2017-01-09 MED ORDER — LISINOPRIL-HYDROCHLOROTHIAZIDE 20-25 MG PO TABS: 1.0000 | ORAL_TABLET | Freq: Every day | ORAL | 1 refills | Status: DC

## 2017-01-09 MED ORDER — TADALAFIL 5 MG PO TABS: 5.0000 mg | ORAL_TABLET | Freq: Every day | ORAL | 0 refills | Status: DC | PRN

## 2017-01-09 NOTE — Progress Notes (Signed)
Name: Eric English.   MRN: [id number]    DOB: 1955-08-09   Date:01/09/2017       Progress Note  Subjective  Chief Complaint  Chief Complaint  Patient presents with  . Follow-up    3 mo  . Medication Refill    Erectile Dysfunction  This is a chronic problem. The nature of his difficulty is maintaining erection. Irritative symptoms do not include frequency or nocturia. Obstructive symptoms do not include dribbling, a slower stream or straining. Past treatments include tadalafil.  Hypertension  This is a chronic problem. The problem is unchanged. The problem is controlled. Pertinent negatives include no blurred vision, chest pain, headaches, orthopnea, palpitations or shortness of breath. Past treatments include ACE inhibitors and diuretics. There is no history of kidney disease, CAD/MI, CVA or heart failure.   Patient has skin tags on the eyelids, present for years, would like to have them removed, no change in color or size of the lesions.    Past Medical History:  Diagnosis Date  . Abnormal glucose   . Hemorrhoids   . Hypertension   . Prostate enlargement   . Sleep apnea   . Trigger finger     Past Surgical History:  Procedure Laterality Date  . COLONOSCOPY  2006  . COLONOSCOPY WITH PROPOFOL N/A 08/15/2015   Procedure: COLONOSCOPY WITH PROPOFOL;  Surgeon: Robert Bellow, MD;  Location[facility] ENDOSCOPY;  Service: Endoscopy;  Laterality: N/A;  . HERNIA REPAIR  7169   umbilical  . VASECTOMY  6789    Family History  Problem Relation Age of Onset  . Diabetes Mother   . Hypertension Mother   . CAD Mother   . Hyperlipidemia Mother   . Heart disease Father   . CAD Father   . Diabetes Father     Social History   Social History  . Marital status: Married    Spouse name: N/A  . Number of children: N/A  . Years of education: N/A   Occupational History  .  [place] History Main Topics  . Smoking status: Former Smoker    Packs/day: 1.00   Years: 24.00    Types: Cigarettes  . Smokeless tobacco: Never Used  . Alcohol use 0.0 oz/week     Comment: Occasionally  . Drug use: No  . Sexual activity: Not on file   Other Topics Concern  . Not on file   Social History Narrative  . No narrative on file     Current Outpatient Prescriptions:  .  fluticasone (FLONASE) 50 MCG/ACT nasal spray, Place 2 sprays into both nostrils daily., Disp: 16 g, Rfl: 2 .  hydrocortisone cream 1 %, Apply to affected area 2 times daily, Disp: 15 g, Rfl: 0 .  ibuprofen (ADVIL,MOTRIN) 200 MG tablet, Take 1 tablet (200 mg total) by mouth every 6 (six) hours as needed., Disp: 30 tablet, Rfl: 0 .  lisinopril-hydrochlorothiazide (PRINZIDE,ZESTORETIC) 20-25 MG tablet, Take 1 tablet by mouth daily., Disp: 90 tablet, Rfl: 1 .  Omega-3 Fatty Acids (FISH OIL) 1200 MG CAPS, Take 1 capsule by mouth 2 (two) times daily., Disp: , Rfl:  .  tadalafil (CIALIS) 5 MG tablet, Take 1 tablet (5 mg total) by mouth daily as needed for erectile dysfunction., Disp: 30 tablet, Rfl: 0  No Known Allergies   Review of Systems  Eyes: Negative for blurred vision.  Respiratory: Negative for shortness of breath.   Cardiovascular: Negative for chest pain, palpitations and orthopnea.  Genitourinary: Negative for frequency and nocturia.  Neurological: Negative for headaches.     Objective  Vitals:   01/09/17 1057  BP: 112/68  Pulse: (!) 104  Resp: 17  Temp: 97.9 F (36.6 C)  TempSrc: Oral  SpO2: 96%  Weight: 248 lb (112.5 kg)  Height: 5\' 10"  (1.778 m)    Physical Exam  Constitutional: He is oriented to person, place, and time and well-developed, well-nourished, and in no distress.  Eyes:  Round skin colored lesion over the eyelids.   Cardiovascular: Normal rate, regular rhythm, S1 normal, S2 normal and normal heart sounds.   No murmur heard. Pulmonary/Chest: Effort normal and breath sounds normal. He has no wheezes.  Abdominal: Soft. Bowel sounds are normal. There  is no tenderness.  Musculoskeletal:       Right ankle: He exhibits no swelling.       Left ankle: He exhibits no swelling.  Neurological: He is alert and oriented to person, place, and time.  Psychiatric: Mood, memory, affect and judgment normal.  Nursing note and vitals reviewed.     Assessment & Plan  1. Benign essential HTN EB stable at present and hypertensive treatment - lisinopril-hydrochlorothiazide (PRINZIDE,ZESTORETIC) 20-25 MG tablet; Take 1 tablet by mouth daily.  Dispense: 90 tablet; Refill: 1  2. Erectile dysfunction, unspecified erectile dysfunction type  - tadalafil (CIALIS) 5 MG tablet; Take 1 tablet (5 mg total) by mouth daily as needed for erectile dysfunction.  Dispense: 30 tablet; Refill: 0  3. Skin tag - Ambulatory referral to Dermatology   Exavier Lina [facility] 01/09/2017 11:22 AM

## 2017-04-14 ENCOUNTER — Other Ambulatory Visit: Payer: Self-pay | Admitting: Family Medicine

## 2017-04-14 DIAGNOSIS — I10 Essential (primary) hypertension: Secondary | ICD-10-CM

## 2017-04-14 MED ORDER — LISINOPRIL-HYDROCHLOROTHIAZIDE 20-25 MG PO TABS: 1.0000 | ORAL_TABLET | Freq: Every day | ORAL | 1 refills | Status: DC

## 2017-04-14 NOTE — Telephone Encounter (Signed)
Prescription for Lisinopril-HCTZ has been sent to [facility] in [place], [place].

## 2017-04-14 NOTE — Telephone Encounter (Signed)
Copied from [place] #[id number]. Topic: Quick Communication - See Telephone Encounter >> Apr 14, 2017 11:04 AM Arletha Grippe wrote: CRM for notification. See Telephone encounter for:  04/14/17. Pt needs 30 day supply for lisinopril-hydrochlorothiazide (PRINZIDE,ZESTORETIC) 20-25 MG tablet. He chnged his follow up appt to December. He would like it sent to [facility] in [place], [place]

## 2017-04-17 ENCOUNTER — Ambulatory Visit: Admitting: Family Medicine

## 2017-05-18 ENCOUNTER — Ambulatory Visit: Admitting: Family Medicine

## 2017-05-29 ENCOUNTER — Ambulatory Visit: Admitting: Family Medicine

## 2017-05-30 ENCOUNTER — Ambulatory Visit (INDEPENDENT_AMBULATORY_CARE_PROVIDER_SITE_OTHER): Payer: BLUE CROSS/BLUE SHIELD | Admitting: Family Medicine

## 2017-05-30 ENCOUNTER — Encounter: Payer: Self-pay | Admitting: Family Medicine

## 2017-05-30 VITALS — BP 128/70 | HR 74 | Temp 98.2°F | Resp 14 | Wt 250.0 lb

## 2017-05-30 DIAGNOSIS — G8929 Other chronic pain: Secondary | ICD-10-CM

## 2017-05-30 DIAGNOSIS — M25512 Pain in left shoulder: Secondary | ICD-10-CM | POA: Diagnosis not present

## 2017-05-30 DIAGNOSIS — N529 Male erectile dysfunction, unspecified: Secondary | ICD-10-CM

## 2017-05-30 DIAGNOSIS — I10 Essential (primary) hypertension: Secondary | ICD-10-CM

## 2017-05-30 DIAGNOSIS — J301 Allergic rhinitis due to pollen: Secondary | ICD-10-CM

## 2017-05-30 MED ORDER — LISINOPRIL-HYDROCHLOROTHIAZIDE 20-25 MG PO TABS: 1.0000 | ORAL_TABLET | Freq: Every day | ORAL | 1 refills | Status: DC

## 2017-05-30 MED ORDER — TADALAFIL 5 MG PO TABS: 5.0000 mg | ORAL_TABLET | Freq: Every day | ORAL | 0 refills | Status: DC | PRN

## 2017-05-30 MED ORDER — FLUTICASONE PROPIONATE 50 MCG/ACT NA SUSP: 2.0000 | Freq: Every day | NASAL | 2 refills | Status: DC

## 2017-05-30 NOTE — Progress Notes (Signed)
Name: Eric English.   MRN: [id number]    DOB: 1955-11-12   Date:05/30/2017       Progress Note  Subjective  Chief Complaint  Chief Complaint  Patient presents with  . Hypertension  . Medication Refill    Asking for hand written for all RX   . Shoulder Pain    For abourt a month. Old pain but getting worst     Hypertension  This is a chronic problem. The problem is unchanged. The problem is controlled. Pertinent negatives include no blurred vision, chest pain, headaches, palpitations or shortness of breath. Past treatments include ACE inhibitors and diuretics. There is no history of kidney disease, CAD/MI or CVA.  Shoulder Pain   The pain is present in the left shoulder. This is a chronic problem. The current episode started more than 1 year ago. There has been no history of extremity trauma. Associated symptoms include stiffness. Pertinent negatives include no inability to bear weight, joint locking or numbness. He has tried NSAIDS for the symptoms. The treatment provided mild relief.  Erectile Dysfunction  This is a chronic problem. The problem is unchanged. The nature of his difficulty is achieving erection. Past treatments include tadalafil. The treatment provided moderate relief. He has had flushing and headaches caused by medications.   Patient reports Cialis is expensive and he would like to try to have it filled at the [facility] in [place], [place]   Past Medical History:  Diagnosis Date  . Abnormal glucose   . Hemorrhoids   . Hypertension   . Prostate enlargement   . Sleep apnea   . Trigger finger     Past Surgical History:  Procedure Laterality Date  . COLONOSCOPY  2006  . COLONOSCOPY WITH PROPOFOL N/A 08/15/2015   Procedure: COLONOSCOPY WITH PROPOFOL;  Surgeon: Robert Bellow, MD;  Location: [facility] ENDOSCOPY;  Service: Endoscopy;  Laterality: N/A;  . HERNIA REPAIR  1638   umbilical  . VASECTOMY  4536    Family History  Problem Relation Age of Onset  .  Diabetes Mother   . Hypertension Mother   . CAD Mother   . Hyperlipidemia Mother   . Heart disease Father   . CAD Father   . Diabetes Father     Social History   Socioeconomic History  . Marital status: Married    Spouse name: Not on file  . Number of children: Not on file  . Years of education: Not on file  . Highest education level: Not on file  Social Needs  . Financial resource strain: Not on file  . Food insecurity - worry: Not on file  . Food insecurity - inability: Not on file  . Transportation needs - medical: Not on file  . Transportation needs - non-medical: Not on file  Occupational History    Employer: [employer]  Tobacco Use  . Smoking status: Former Smoker    Packs/day: 1.00    Years: 24.00    Pack years: 24.00    Types: Cigarettes  . Smokeless tobacco: Never Used  Substance and Sexual Activity  . Alcohol use: Yes    Alcohol/week: 0.0 oz    Comment: Occasionally  . Drug use: No  . Sexual activity: Not on file  Other Topics Concern  . Not on file  Social History Narrative  . Not on file     Current Outpatient Medications:  .  B Complex Vitamins (VITAMIN B COMPLEX PO), Take 1 tablet by  mouth daily., Disp: , Rfl:  .  fluticasone (FLONASE) 50 MCG/ACT nasal spray, Place 2 sprays into both nostrils daily., Disp: 16 g, Rfl: 2 .  lisinopril-hydrochlorothiazide (PRINZIDE,ZESTORETIC) 20-25 MG tablet, Take 1 tablet by mouth daily., Disp: 90 tablet, Rfl: 1 .  Omega-3 Fatty Acids (FISH OIL) 1200 MG CAPS, Take 1 capsule by mouth 2 (two) times daily., Disp: , Rfl:  .  tadalafil (CIALIS) 5 MG tablet, Take 1 tablet (5 mg total) by mouth daily as needed for erectile dysfunction., Disp: 30 tablet, Rfl: 0 .  hydrocortisone cream 1 %, Apply to affected area 2 times daily (Patient not taking: Reported on 05/30/2017), Disp: 15 g, Rfl: 0  No Known Allergies   Review of Systems  Eyes: Negative for blurred vision.  Respiratory: Negative for shortness of breath.    Cardiovascular: Negative for chest pain and palpitations.  Musculoskeletal: Positive for stiffness.  Neurological: Negative for numbness and headaches.     Objective  Vitals:   05/30/17 1131  BP: 128/70  Pulse: 74  Resp: 14  Temp: 98.2 F (36.8 C)  TempSrc: Oral  SpO2: 98%  Weight: 250 lb (113.4 kg)    Physical Exam  Constitutional: He is well-developed, well-nourished, and in no distress.  Cardiovascular: Normal rate, regular rhythm and normal heart sounds.  No murmur heard. Pulmonary/Chest: Effort normal and breath sounds normal.  Musculoskeletal: He exhibits no edema.       Left shoulder: He exhibits decreased range of motion and tenderness. He exhibits no swelling, no effusion and no deformity.  Empty can test positive.   Psychiatric: Mood, memory, affect and judgment normal.  Nursing note and vitals reviewed.      Assessment & Plan  1. Benign essential HTN BP stable on present antihypertensive treatment - lisinopril-hydrochlorothiazide (PRINZIDE,ZESTORETIC) 20-25 MG tablet; Take 1 tablet by mouth daily.  Dispense: 90 tablet; Refill: 1  2. Chronic seasonal allergic rhinitis due to pollen  - fluticasone (FLONASE) 50 MCG/ACT nasal spray; Place 2 sprays into both nostrils daily.  Dispense: 16 g; Refill: 2  3. Erectile dysfunction, unspecified erectile dysfunction type  - tadalafil (CIALIS) 5 MG tablet; Take 1 tablet (5 mg total) by mouth daily as needed for erectile dysfunction.  Dispense: 30 tablet; Refill: 0  4. Chronic left shoulder pain Suspect shoulder impingement, obtain x-rays for evaluation, may need specialty referral - DG Shoulder Left; Future   Myda Detwiler [facility] 05/30/2017 11:46 AM

## 2017-06-01 ENCOUNTER — Ambulatory Visit: Admitting: Family Medicine

## 2017-07-23 ENCOUNTER — Telehealth: Payer: Self-pay

## 2017-07-23 NOTE — Telephone Encounter (Signed)
Pt states he work in [place], [place] and he would like to check his  Left shoulder out; onset two months ago. Pt states he can move it but when he pick up weight. Pt do not have a preference on where to go and he do not want to establish care with a specialty but just someone to look at his shoulder. I suggest to see if they have a walk in clinic for ortho so they can look at his shoulder first. Pt understood          Copied from [place] [id number]. Topic: Referral - Question >> Jul 21, 2017  8:43 AM Bea Graff, NT wrote: Reason for CRM: Patient is in [facility] and wanting to see a dr about his shoulder but is unsure if he needs a referral. He also would like to know if there are in offices in [place] that are in the same network as [facility] so that [facility] can receive the records. Please call pt

## 2017-08-28 ENCOUNTER — Ambulatory Visit: Admitting: Family Medicine

## 2018-01-04 ENCOUNTER — Ambulatory Visit (INDEPENDENT_AMBULATORY_CARE_PROVIDER_SITE_OTHER): Payer: BLUE CROSS/BLUE SHIELD | Admitting: Family Medicine

## 2018-01-04 ENCOUNTER — Encounter: Payer: Self-pay | Admitting: Family Medicine

## 2018-01-04 VITALS — BP 134/76 | HR 60 | Temp 98.2°F | Resp 16 | Ht 70.0 in | Wt 241.7 lb

## 2018-01-04 DIAGNOSIS — G8929 Other chronic pain: Secondary | ICD-10-CM | POA: Insufficient documentation

## 2018-01-04 DIAGNOSIS — M25512 Pain in left shoulder: Secondary | ICD-10-CM | POA: Diagnosis not present

## 2018-01-04 NOTE — Progress Notes (Signed)
Name: Eric English.   MRN: [id number]    DOB: [birth year]   Date:01/04/2018       Progress Note  Subjective  Chief Complaint  Chief Complaint  Patient presents with  . Shoulder Injury    MRI show torn rotator cuff    HPI  Pt presents with LEFT shoulder pain since December 2018 - he was in [facility] and had an MRI done at [facility] and it showed a torn rotator cuff - this was done April 2019.  He does exercise regularly and plays golf frequently.  We did send a request on 12/16/2017, but records are not available yet.  Denies numbness/tingling in the lower arm, but does have some in the upper shoulder and upper arm; pain is especially bad with sudden movement and over exertion.  Takes Tylenol and/or Advil and this seems to control his pain.    Patient Active Problem List   Diagnosis Date Noted  . Hypertension 08/28/2017  . Allergic rhinitis due to pollen 09/19/2016  . Hemorrhoids 07/15/2016  . BRBPR (bright red blood per rectum) 07/15/2016  . URI with cough and congestion 06/18/2016  . 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 Elevated liver enzymes 10/08/2015  . Elevated prostate specific antigen (PSA) 07/11/2015  . Screening for gout 07/11/2015  . Gastroenteritis due to norovirus 07/11/2015  . Superficial thrombophlebitis 06/23/2015  . Foot pain, right 06/21/2015  . Allergic rhinitis 12/12/2014  . Abnormal prostate specific antigen 12/12/2014  . Diastasis recti 12/27/2013  . Abnormal blood sugar 03/15/2010  . Benign essential HTN 03/15/2010  . Accumulation of fluid in tissues 03/03/2009   Social History   Tobacco Use  . Smoking status: Former Smoker    Packs/day: 1.00    Years: 24.00    Pack years: 24.00    Types: Cigarettes  . Smokeless tobacco: Never Used  Substance Use Topics  . Alcohol use: Yes    Alcohol/week: 0.0 oz    Comment: Occasionally     Current Outpatient Medications:  .  B Complex Vitamins (VITAMIN B COMPLEX PO), Take 1 tablet by  mouth daily., Disp: , Rfl:  .  fluticasone (FLONASE) 50 MCG/ACT nasal spray, Place 2 sprays into both nostrils daily., Disp: 16 g, Rfl: 2 .  lisinopril-hydrochlorothiazide (PRINZIDE,ZESTORETIC) 20-25 MG tablet, Take 1 tablet by mouth daily., Disp: 90 tablet, Rfl: 1 .  Omega-3 Fatty Acids (FISH OIL) 1200 MG CAPS, Take 1 capsule by mouth 2 (two) times daily., Disp: , Rfl:  .  tadalafil (CIALIS) 5 MG tablet, Take 1 tablet (5 mg total) by mouth daily as needed for erectile dysfunction., Disp: 30 tablet, Rfl: 0 .  hydrocortisone cream 1 %, Apply to affected area 2 times daily (Patient not taking: Reported on 01/04/2018), Disp: 15 g, Rfl: 0  No Known Allergies  ROS Ten systems reviewed and is negative except as mentioned in HPI  Objective  Vitals:   01/04/18 0948  BP: 134/76  Pulse: 60  Resp: 16  Temp: 98.2 F (36.8 C)  TempSrc: Oral  SpO2: 98%  Weight: 241 lb 11.2 oz (109.6 kg)  Height: 5\' 10"  (1.778 m)   Body mass index is 34.68 kg/m.  Nursing Note and Vital Signs reviewed.  Physical Exam  Constitutional: He is oriented to person, place, and time. He appears well-developed and well-nourished.  HENT:  Head: Normocephalic and atraumatic.  Right Ear: Tympanic membrane, external ear and ear canal normal.  Left Ear: Tympanic membrane, external ear and  ear canal normal.  Nose: Nose normal.  Mouth/Throat: Uvula is midline and mucous membranes are normal. No tonsillar exudate.  Eyes: Conjunctivae and EOM are normal. No scleral icterus.  Neck: Normal range of motion. Neck supple.  Cardiovascular: Normal rate, regular rhythm and normal heart sounds.  Pulmonary/Chest: Effort normal and breath sounds normal. No respiratory distress.  Abdominal: Soft. Bowel sounds are normal. He exhibits no mass. There is no tenderness. There is no rebound.  Musculoskeletal: He exhibits no edema.       Left shoulder: He exhibits decreased range of motion, tenderness and decreased strength. He exhibits no  bony tenderness, no swelling, no effusion, no crepitus, no deformity and no spasm.  Neurological: He is alert and oriented to person, place, and time. No cranial nerve deficit.  Skin: Skin is warm and dry. No rash noted. No erythema.  Psychiatric: He has a normal mood and affect. His behavior is normal. Judgment and thought content normal.  Nursing note and vitals reviewed.   No results found for this or any previous visit (from the past 72 hour(s)).  Assessment & Plan  1. Acute pain of left shoulder - Take Tylenol or Advil PRN - Ambulatory referral to Orthopedic Surgery

## 2018-01-05 ENCOUNTER — Ambulatory Visit: Admitting: Family Medicine

## 2018-01-11 ENCOUNTER — Encounter: Payer: Self-pay | Admitting: Family Medicine

## 2018-01-14 ENCOUNTER — Other Ambulatory Visit: Payer: Self-pay | Admitting: Family Medicine

## 2018-03-10 ENCOUNTER — Other Ambulatory Visit: Payer: Self-pay | Admitting: Emergency Medicine

## 2018-03-10 DIAGNOSIS — I10 Essential (primary) hypertension: Secondary | ICD-10-CM

## 2018-03-10 NOTE — Progress Notes (Unsigned)
If he is needing refill of lisinopril-hctz, he needs to come in for a regular follow up.  He has not been prescribed this since December 2018, and has likely been out for several months.  Please schedule an appointment.

## 2018-03-10 NOTE — Progress Notes (Signed)
Please schedule appointment for patient when he is in town

## 2018-03-11 NOTE — Progress Notes (Signed)
Called [phone] @ 8:27 left voice mail asking pt to return call to schedule appt

## 2018-05-14 ENCOUNTER — Ambulatory Visit: Admitting: Family Medicine

## 2018-05-14 ENCOUNTER — Encounter: Payer: Self-pay | Admitting: Family Medicine

## 2018-05-14 ENCOUNTER — Ambulatory Visit (INDEPENDENT_AMBULATORY_CARE_PROVIDER_SITE_OTHER): Payer: No Typology Code available for payment source | Admitting: Family Medicine

## 2018-05-14 ENCOUNTER — Other Ambulatory Visit
Admission: RE | Admit: 2018-05-14 | Discharge: 2018-05-14 | Disposition: A | Discharge status: Home / Self Care | Payer: No Typology Code available for payment source | Source: Ambulatory Visit | Attending: Family Medicine | Admitting: Family Medicine

## 2018-05-14 VITALS — BP 118/60 | HR 67 | Temp 98.2°F | Ht 70.0 in | Wt 250.6 lb

## 2018-05-14 DIAGNOSIS — N529 Male erectile dysfunction, unspecified: Secondary | ICD-10-CM | POA: Diagnosis not present

## 2018-05-14 DIAGNOSIS — Z13220 Encounter for screening for lipoid disorders: Secondary | ICD-10-CM

## 2018-05-14 DIAGNOSIS — Z23 Encounter for immunization: Secondary | ICD-10-CM | POA: Diagnosis not present

## 2018-05-14 DIAGNOSIS — M25512 Pain in left shoulder: Secondary | ICD-10-CM | POA: Diagnosis not present

## 2018-05-14 DIAGNOSIS — I10 Essential (primary) hypertension: Secondary | ICD-10-CM | POA: Diagnosis present

## 2018-05-14 DIAGNOSIS — R739 Hyperglycemia, unspecified: Secondary | ICD-10-CM

## 2018-05-14 DIAGNOSIS — G8929 Other chronic pain: Secondary | ICD-10-CM

## 2018-05-14 DIAGNOSIS — R972 Elevated prostate specific antigen [PSA]: Secondary | ICD-10-CM

## 2018-05-14 LAB — COMPREHENSIVE METABOLIC PANEL
ALT: 35 U/L (ref 0–44)
AST: 27 U/L (ref 15–41)
Albumin: 4.1 g/dL (ref 3.5–5.0)
Alkaline Phosphatase: 52 U/L (ref 38–126)
Anion gap: 6 (ref 5–15)
BILIRUBIN TOTAL: 0.7 mg/dL (ref 0.3–1.2)
BUN: 16 mg/dL (ref 8–23)
CO2: 30 mmol/L (ref 22–32)
Calcium: 9.4 mg/dL (ref 8.9–10.3)
Chloride: 102 mmol/L (ref 98–111)
Creatinine, Ser: 0.93 mg/dL (ref 0.61–1.24)
GFR calc Af Amer: >60 mL/min (ref >60)
Glucose, Bld: 105 mg/dL — ABNORMAL HIGH (ref 70–99)
Potassium: 3.6 mmol/L (ref 3.5–5.1)
Sodium: 138 mmol/L (ref 135–145)
TOTAL PROTEIN: 7.6 g/dL (ref 6.5–8.1)

## 2018-05-14 LAB — LIPID PANEL
Cholesterol: 142 mg/dL (ref 0–200)
HDL: 34 mg/dL — ABNORMAL LOW (ref >40)
LDL Cholesterol: 90 mg/dL (ref 0–99)
Total CHOL/HDL Ratio: 4.2 RATIO
Triglycerides: 91 mg/dL (ref <150)
VLDL: 18 mg/dL (ref 0–40)

## 2018-05-14 LAB — CBC WITH DIFFERENTIAL/PLATELET
Abs Immature Granulocytes: 0.02 10*3/uL (ref 0.00–0.07)
Basophils Absolute: 0 10*3/uL (ref 0.0–0.1)
Basophils Relative: 0 %
EOS ABS: 0 10*3/uL (ref 0.0–0.5)
Eosinophils Relative: 0 %
HEMATOCRIT: 42.6 % (ref 39.0–52.0)
Hemoglobin: 13.7 g/dL (ref 13.0–17.0)
Immature Granulocytes: 0 %
Lymphocytes Relative: 31 %
Lymphs Abs: 1.5 10*3/uL (ref 0.7–4.0)
MCH: 29 pg (ref 26.0–34.0)
MCHC: 32.2 g/dL (ref 30.0–36.0)
MCV: 90.1 fL (ref 80.0–100.0)
Monocytes Absolute: 0.6 10*3/uL (ref 0.1–1.0)
Monocytes Relative: 11 %
NEUTROS PCT: 58 %
Neutro Abs: 2.9 10*3/uL (ref 1.7–7.7)
Platelets: 253 10*3/uL (ref 150–400)
RBC: 4.73 MIL/uL (ref 4.22–5.81)
RDW: 13.3 % (ref 11.5–15.5)
WBC: 5 10*3/uL (ref 4.0–10.5)
nRBC: 0 % (ref 0.0–0.2)

## 2018-05-14 LAB — PSA: Prostatic Specific Antigen: 3.28 ng/mL (ref 0.00–4.00)

## 2018-05-14 MED ORDER — LISINOPRIL-HYDROCHLOROTHIAZIDE 20-25 MG PO TABS: 1.0000 | ORAL_TABLET | Freq: Every day | ORAL | 1 refills | Status: DC

## 2018-05-14 NOTE — Progress Notes (Signed)
Name: Eric English.   MRN: [id number]    DOB: 05-18-1956   Date:05/14/2018       Progress Note  Subjective  Chief Complaint  Chief Complaint  Patient presents with  . Medication Refill    HPI  HTN: he is compliant with medication, denies chest pain or palpitation. He is due for labs.   Hyperglycemia: hgbA1C 6.4% two years ago, but did not have any more labs. He denies polyphagia, polyuria or polyphagia.   Chronic left shoulder pain: he was seen by [name] months ago, he continues to have pain with rom, he was referred to Ortho but unable to go to the visit, discussed walk in at [facility] today  Elevated PSA: he followed up by [facility] back in 2016, he was advised to follow up in one year, but lost to follow up. We will recheck level today and may need to go back. He will return for CPE  Patient Active Problem List   Diagnosis Date Noted  . Chronic left shoulder pain 01/04/2018  . Allergic rhinitis due to pollen 09/19/2016  . Hemorrhoids 07/15/2016  . 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 Elevated liver enzymes 10/08/2015  . Elevated prostate specific antigen (PSA) 07/11/2015  . Screening for gout 07/11/2015  . Superficial thrombophlebitis 06/23/2015  . Allergic rhinitis 12/12/2014  . Diastasis recti 12/27/2013  . Abnormal blood sugar 03/15/2010  . Benign essential HTN 03/15/2010    Past Surgical History:  Procedure Laterality Date  . COLONOSCOPY  2006  . COLONOSCOPY WITH PROPOFOL N/A 08/15/2015   Procedure: COLONOSCOPY WITH PROPOFOL;  Surgeon: Robert Bellow, MD;  Location: [facility] ENDOSCOPY;  Service: Endoscopy;  Laterality: N/A;  . HERNIA REPAIR  7517   umbilical  . VASECTOMY  0017    Family History  Problem Relation Age of Onset  . Diabetes Mother   . Hypertension Mother   . CAD Mother   . Hyperlipidemia Mother   . Heart disease Father   . CAD Father   . Diabetes Father     Social History   Socioeconomic History  . Marital  status: Married    Spouse name: Junious Dresser   . Number of children: 3  . Years of education: Not on file  . Highest education level: Bachelor's degree (e.g., BA, AB, BS)  Occupational History    Employer: [employer]  Social Needs  . Financial resource strain: Not hard at all  . Food insecurity:    Worry: Never true    Inability: Never true  . Transportation needs:    Medical: No    Non-medical: No  Tobacco Use  . Smoking status: Former Smoker    Packs/day: 1.00    Years: 24.00    Pack years: 24.00    Types: Cigarettes  . Smokeless tobacco: Never Used  Substance and Sexual Activity  . Alcohol use: Yes    Alcohol/week: 0.0 standard drinks    Comment: Occasionally  . Drug use: No  . Sexual activity: Not on file  Lifestyle  . Physical activity:    Days per week: 6 days    Minutes per session: 120 min  . Stress: Not at all  Relationships  . Social connections:    Talks on phone: More than three times a week    Gets together: More than three times a week    Attends religious service: More than 4 times per year    Active member of club or  organization: Yes    Attends meetings of clubs or organizations: More than 4 times per year    Relationship status: Married  . Intimate partner violence:    Fear of current or ex partner: No    Emotionally abused: No    Physically abused: No    Forced sexual activity: No  Other Topics Concern  . Not on file  Social History Narrative   Married, wife and children live in [place], he works in [place] , retired from [employer] and also from [place] [place]     Current Outpatient Medications:  .  B Complex Vitamins (VITAMIN B COMPLEX PO), Take 1 tablet by mouth daily., Disp: , Rfl:  .  fluticasone (FLONASE) 50 MCG/ACT nasal spray, Place 2 sprays into both nostrils daily., Disp: 16 g, Rfl: 2 .  lisinopril-hydrochlorothiazide (PRINZIDE,ZESTORETIC) 20-25 MG tablet, Take 1 tablet by mouth daily., Disp: 90 tablet, Rfl: 1 .  Omega-3 Fatty Acids  (FISH OIL) 1200 MG CAPS, Take 1 capsule by mouth 2 (two) times daily., Disp: , Rfl:  .  tadalafil (CIALIS) 5 MG tablet, Take 1 tablet (5 mg total) by mouth daily as needed for erectile dysfunction., Disp: 30 tablet, Rfl: 0  No Known Allergies  I personally reviewed active problem list, medication list, allergies, family history, social history, health maintenance with the patient/caregiver today.   ROS  Constitutional: Negative for fever or weight change.  Respiratory: Negative for cough and shortness of breath.   Cardiovascular: Negative for chest pain or palpitations.  Gastrointestinal: Negative for abdominal pain, no bowel changes.  Musculoskeletal: Negative for gait problem or joint swelling.  Skin: Negative for rash.  Neurological: Negative for dizziness or headache.  No other specific complaints in a complete review of systems (except as listed in HPI above).   Objective  Vitals:   05/14/18 0910  BP: 118/60  Pulse: 67  Temp: 98.2 F (36.8 C)  SpO2: 97%  Weight: 250 lb 9.6 oz (113.7 kg)  Height: 5\' 10"  (1.778 m)    Body mass index is 35.96 kg/m.  Physical Exam  Constitutional: Patient appears well-developed and well-nourished. Obese  No distress.  HEENT: head atraumatic, normocephalic, pupils equal and reactive to light, neck supple, throat within normal limits Cardiovascular: Normal rate, regular rhythm and normal heart sounds.  No murmur heard. No BLE edema. Pulmonary/Chest: Effort normal and breath sounds normal. No respiratory distress. Abdominal: Soft.  There is no tenderness. Psychiatric: Patient has a normal mood and affect. behavior is normal. Judgment and thought content normal.   PHQ2/9: Depression screen [facility] 2/9 05/14/2018 01/04/2018 05/30/2017 01/09/2017 09/19/2016  Decreased Interest 0 0 0 0 0  Down, Depressed, Hopeless 0 0 0 0 0  PHQ - 2 Score 0 0 0 0 0  Altered sleeping 0 0 - - -  Tired, decreased energy 0 0 - - -  Change in appetite 0 0 - - -   Feeling bad or failure about yourself  0 0 - - -  Trouble concentrating 0 0 - - -  Moving slowly or fidgety/restless 0 0 - - -  Suicidal thoughts 0 0 - - -  PHQ-9 Score 0 0 - - -  Difficult doing work/chores Not difficult at all Not difficult at all - - -    Fall Risk: Fall Risk  05/14/2018 01/04/2018 05/30/2017 01/09/2017 09/19/2016  Falls in the past year? 0 No No No No     Functional Status Survey: Is the patient deaf or  have difficulty hearing?: No Does the patient have difficulty seeing, even when wearing glasses/contacts?: No Does the patient have difficulty concentrating, remembering, or making decisions?: No Does the patient have difficulty walking or climbing stairs?: No Does the patient have difficulty dressing or bathing?: No Does the patient have difficulty doing errands alone such as visiting a doctor's office or shopping?: No    Assessment & Plan  1. Benign essential HTN  - lisinopril-hydrochlorothiazide (PRINZIDE,ZESTORETIC) 20-25 MG tablet; Take 1 tablet by mouth daily.  Dispense: 90 tablet; Refill: 1 - Comprehensive metabolic panel - CBC with Differential/Platelet  2. Chronic left shoulder pain  He will go to [facility] walk in, he [place] the [place] and has difficulty scheduling an appointment   3. Erectile dysfunction of organic origin   4. Hyperglycemia  - Hemoglobin A1c  5. Lipid screening  - Lipid panel  6. Elevated PSA  - PSA

## 2018-05-15 LAB — HEMOGLOBIN A1C
Hgb A1c MFr Bld: 5.7 % — ABNORMAL HIGH (ref 4.8–5.6)
Mean Plasma Glucose: 116.89 mg/dL

## 2018-07-27 ENCOUNTER — Telehealth: Payer: Self-pay

## 2018-07-27 NOTE — Telephone Encounter (Signed)
Please call to check in on patient.

## 2018-07-27 NOTE — Telephone Encounter (Signed)
Copied from [place] [phone]. Topic: General - Other >> Jul 26, 2018  2:14 PM Leward Quan A wrote: Reason for CRM: Patient called to say that he will not be able to reschedule his Physical and need a call back from someone by Friday please. Ph# [phone]

## 2018-07-28 NOTE — Telephone Encounter (Signed)
Please call patient to give appointment that is good for him

## 2018-07-28 NOTE — Telephone Encounter (Signed)
Eric English spoke with pt and he will call back to resch the appt

## 2018-07-30 ENCOUNTER — Other Ambulatory Visit (HOSPITAL_COMMUNITY)
Admission: RE | Admit: 2018-07-30 | Discharge: 2018-07-30 | Disposition: A | Discharge status: Home / Self Care | Payer: No Typology Code available for payment source | Source: Ambulatory Visit | Attending: Family Medicine | Admitting: Family Medicine

## 2018-07-30 ENCOUNTER — Encounter: Payer: Self-pay | Admitting: Family Medicine

## 2018-07-30 ENCOUNTER — Ambulatory Visit (INDEPENDENT_AMBULATORY_CARE_PROVIDER_SITE_OTHER): Payer: No Typology Code available for payment source | Admitting: Family Medicine

## 2018-07-30 ENCOUNTER — Other Ambulatory Visit: Payer: Self-pay

## 2018-07-30 ENCOUNTER — Encounter: Payer: No Typology Code available for payment source | Admitting: Family Medicine

## 2018-07-30 VITALS — BP 150/60 | HR 90 | Temp 98.0°F | Resp 16 | Ht 69.0 in | Wt 251.9 lb

## 2018-07-30 DIAGNOSIS — Z114 Encounter for screening for human immunodeficiency virus [HIV]: Secondary | ICD-10-CM

## 2018-07-30 DIAGNOSIS — N529 Male erectile dysfunction, unspecified: Secondary | ICD-10-CM

## 2018-07-30 DIAGNOSIS — I10 Essential (primary) hypertension: Secondary | ICD-10-CM

## 2018-07-30 DIAGNOSIS — Z1159 Encounter for screening for other viral diseases: Secondary | ICD-10-CM | POA: Diagnosis not present

## 2018-07-30 DIAGNOSIS — Z125 Encounter for screening for malignant neoplasm of prostate: Secondary | ICD-10-CM

## 2018-07-30 DIAGNOSIS — Z113 Encounter for screening for infections with a predominantly sexual mode of transmission: Secondary | ICD-10-CM

## 2018-07-30 DIAGNOSIS — R739 Hyperglycemia, unspecified: Secondary | ICD-10-CM

## 2018-07-30 DIAGNOSIS — E786 Lipoprotein deficiency: Secondary | ICD-10-CM | POA: Diagnosis not present

## 2018-07-30 DIAGNOSIS — Z0000 Encounter for general adult medical examination without abnormal findings: Secondary | ICD-10-CM | POA: Diagnosis not present

## 2018-07-30 DIAGNOSIS — Z131 Encounter for screening for diabetes mellitus: Secondary | ICD-10-CM

## 2018-07-30 DIAGNOSIS — R7303 Prediabetes: Secondary | ICD-10-CM

## 2018-07-30 DIAGNOSIS — Z13220 Encounter for screening for lipoid disorders: Secondary | ICD-10-CM

## 2018-07-30 MED ORDER — TADALAFIL 5 MG PO TABS: 5.0000 mg | ORAL_TABLET | Freq: Every day | ORAL | 1 refills | Status: DC | PRN

## 2018-07-30 NOTE — Progress Notes (Signed)
Name: Eric English.   MRN: [id number]    DOB: [birth year]   Date:08/02/2018       Progress Note  Subjective  Chief Complaint  Chief Complaint  Patient presents with  . Annual Exam    HPI  Patient presents for annual CPE.  USPSTF grade A and B recommendations:  Diet: Balanced Exercise: He exercises daily - weight lifting  Depression:  Depression screen [facility] 2/9 07/30/2018 05/14/2018 01/04/2018 05/30/2017 01/09/2017  Decreased Interest 0 0 0 0 0  Down, Depressed, Hopeless 0 0 0 0 0  PHQ - 2 Score 0 0 0 0 0  Altered sleeping - 0 0 - -  Tired, decreased energy - 0 0 - -  Change in appetite - 0 0 - -  Feeling bad or failure about yourself  - 0 0 - -  Trouble concentrating - 0 0 - -  Moving slowly or fidgety/restless - 0 0 - -  Suicidal thoughts - 0 0 - -  PHQ-9 Score - 0 0 - -  Difficult doing work/chores - Not difficult at all Not difficult at all - -   Hypertension:  BP Readings from Last 3 Encounters:  07/30/18 (!) 150/60  05/14/18 118/60  01/04/18 134/76   Obesity: Wt Readings from Last 3 Encounters:  07/30/18 251 lb 14.4 oz (114.3 kg)  05/14/18 250 lb 9.6 oz (113.7 kg)  01/04/18 241 lb 11.2 oz (109.6 kg)   BMI Readings from Last 3 Encounters:  07/30/18 37.20 kg/m  05/14/18 35.96 kg/m  01/04/18 34.68 kg/m    Lipids:  Lab Results  Component Value Date   CHOL 149 07/30/2018   CHOL 142 05/14/2018   CHOL 183 12/25/2014   Lab Results  Component Value Date   HDL 34 (L) 07/30/2018   HDL 34 (L) 05/14/2018   HDL 41 12/25/2014   Lab Results  Component Value Date   LDLCALC 90 07/30/2018   LDLCALC 90 05/14/2018   LDLCALC 113 (H) 12/25/2014   Lab Results  Component Value Date   TRIG 146 07/30/2018   TRIG 91 05/14/2018   TRIG 144 12/25/2014   Lab Results  Component Value Date   CHOLHDL 4.4 07/30/2018   CHOLHDL 4.2 05/14/2018   CHOLHDL 4.5 12/25/2014   No results found for: LDLDIRECT Glucose:  Glucose  Date Value Ref Range Status  10/08/2015  120 (H) 65 - 99 mg/dL Final  07/11/2015 106 (H) 65 - 99 mg/dL Final   Glucose, Bld  Date Value Ref Range Status  07/30/2018 100 (H) 65 - 99 mg/dL Final    Comment:    .            Fasting reference interval . For someone without known diabetes, a glucose value between 100 and 125 mg/dL is consistent with prediabetes and should be confirmed with a follow-up test. .   05/14/2018 105 (H) 70 - 99 mg/dL Final  07/08/2015 108 (H) 65 - 99 mg/dL Final      Office Visit from 07/30/2018 in [facility]  AUDIT-C Score  0     Married STD testing and prevention (HIV/chl/gon/syphilis): 1 new partner this year aside from wife; we will check today. Hep C: We will check today  Skin cancer: No concerning lesions.  Has had several moles removed by dermatology in the past. Declines referral at this time. We will check skin today.  Colorectal cancer: Denies family or personal history of colorectal cancer, no changes in BM's -  no blood in stool, dark and tarry stool, mucus in stool, or constipation/diarrhea. Does have hemorrhoids.   Prostate cancer: We will check today. No family history. Has had elevated PSA in the past - was monitored by [facility]rology for about a year and was discharged. Notes are reviewed and we will check PSA today.  He is struggling with ED - has been taking cialis only PRN.  We discussed several options, and we will trial daily cialis as some studies have shown this improves outcomes in some men with complete ED.  Lab Results  Component Value Date   PSA 2.8 07/30/2018    IPSS Questionnaire (AUA-7): Over the past month.   1)  How often have you had a sensation of not emptying your bladder completely after you finish urinating?  1 - Less than 1 time in 5  2)  How often have you had to urinate again less than two hours after you finished urinating? 2 - Less than half the time  3)  How often have you found you stopped and started again several times when you  urinated?  0 - Not at all  4) How difficult have you found it to postpone urination?  0 - Not at all  5) How often have you had a weak urinary stream?  0 - Not at all  6) How often have you had to push or strain to begin urination?  0 - Not at all  7) How many times did you most typically get up to urinate from the time you went to bed until the time you got up in the morning?  0 - None  Total score:  0-7 mildly symptomatic   8-19 moderately symptomatic   20-35 severely symptomatic  Score of 3.   Lung cancer: Former smoker - smoked 25 years x1ppd. Quit in 2009 Low Dose CT Chest recommended if Age 26-80 years, 30 pack-year currently smoking OR have quit w/in 15years. Patient does not qualify.   AAA: Will check next year The USPSTF recommends one-time screening with ultrasonography in men ages 39 to 62 years who have ever smoked ECG:  No chest pain, shortness of breath, or palpitations.   Advanced Care Planning: A voluntary discussion about advance care planning including the explanation and discussion of advance directives.  Discussed health care proxy and Living will, and the patient was able to identify a health care proxy as his wife - Eric English.  Patient does not have a living will at present time. If patient does have living will, I have requested they bring this to the clinic to be scanned in to their chart.  Patient Active Problem List   Diagnosis Date Noted  . Chronic left shoulder pain 01/04/2018  . Allergic rhinitis due to pollen 09/19/2016  . Hemorrhoids 07/15/2016  . 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 Elevated liver enzymes 10/08/2015  . Elevated prostate specific antigen (PSA) 07/11/2015  . Screening for gout 07/11/2015  . Superficial thrombophlebitis 06/23/2015  . Allergic rhinitis 12/12/2014  . Diastasis recti 12/27/2013  . Abnormal blood sugar 03/15/2010  . Benign essential HTN 03/15/2010    Past Surgical History:  Procedure Laterality Date  .  COLONOSCOPY  2006  . COLONOSCOPY WITH PROPOFOL N/A 08/15/2015   Procedure: COLONOSCOPY WITH PROPOFOL;  Surgeon: Robert Bellow, MD;  Location: [facility] ENDOSCOPY;  Service: Endoscopy;  Laterality: N/A;  . HERNIA REPAIR  [id number]   umbilical  . VASECTOMY  9562  Family History  Problem Relation Age of Onset  . Diabetes Mother   . Hypertension Mother   . CAD Mother   . Hyperlipidemia Mother   . Heart disease Father   . CAD Father   . Diabetes Father     Social History   Socioeconomic History  . Marital status: Married    Spouse name: Junious Dresser   . Number of children: 3  . Years of education: Not on file  . Highest education level: Bachelor's degree (e.g., BA, AB, BS)  Occupational History    Employer: [employer]  Social Needs  . Financial resource strain: Not hard at all  . Food insecurity:    Worry: Never true    Inability: Never true  . Transportation needs:    Medical: No    Non-medical: No  Tobacco Use  . Smoking status: Former Smoker    Packs/day: 1.00    Years: 24.00    Pack years: 24.00    Types: Cigarettes  . Smokeless tobacco: Never Used  Substance and Sexual Activity  . Alcohol use: Yes    Alcohol/week: 0.0 standard drinks    Comment: Occasionally  . Drug use: No  . Sexual activity: Not on file  Lifestyle  . Physical activity:    Days per week: 6 days    Minutes per session: 120 min  . Stress: Not at all  Relationships  . Social connections:    Talks on phone: More than three times a week    Gets together: Once a week    Attends religious service: More than 4 times per year    Active member of club or organization: Yes    Attends meetings of clubs or organizations: Never    Relationship status: Married  . Intimate partner violence:    Fear of current or ex partner: No    Emotionally abused: No    Physically abused: No    Forced sexual activity: No  Other Topics Concern  . Not on file  Social History Narrative   Married, wife and children  live in [place], he works in [place] , retired from [employer] and also from [employer] [place]     Current Outpatient Medications:  .  B Complex Vitamins (VITAMIN B COMPLEX PO), Take 1 tablet by mouth daily., Disp: , Rfl:  .  fluticasone (FLONASE) 50 MCG/ACT nasal spray, Place 2 sprays into both nostrils daily., Disp: 16 g, Rfl: 2 .  lisinopril-hydrochlorothiazide (PRINZIDE,ZESTORETIC) 20-25 MG tablet, Take 1 tablet by mouth daily., Disp: 90 tablet, Rfl: 1 .  Omega-3 Fatty Acids (FISH OIL) 1200 MG CAPS, Take 1 capsule by mouth 2 (two) times daily., Disp: , Rfl:  .  tadalafil (CIALIS) 5 MG tablet, Take 1 tablet (5 mg total) by mouth daily as needed for erectile dysfunction., Disp: 90 tablet, Rfl: 1  No Known Allergies   ROS  Constitutional: Negative for fever or weight change.  Respiratory: Negative for cough and shortness of breath.   Cardiovascular: Negative for chest pain or palpitations.  Gastrointestinal: Negative for abdominal pain, no bowel changes.  Musculoskeletal: Negative for gait problem or joint swelling.  Skin: Negative for rash.  Neurological: Negative for dizziness or headache.  No other specific complaints in a complete review of systems (except as listed in HPI above).   Objective  Vitals:   07/30/18 1346  BP: (!) 150/60  Pulse: 90  Resp: 16  Temp: 98 F (36.7 C)  TempSrc: Oral  SpO2:  97%  Weight: 251 lb 14.4 oz (114.3 kg)  Height: 5\' 9"  (1.753 m)    Body mass index is 37.2 kg/m.  Physical Exam  Constitutional: Patient appears well-developed and well-nourished. No distress.  HENT: Head: Normocephalic and atraumatic. Ears: B TMs ok, no erythema or effusion; Nose: Nose normal. Mouth/Throat: Oropharynx is clear and moist. No oropharyngeal exudate.  Eyes: Conjunctivae and EOM are normal. Pupils are equal, round, and reactive to light. No scleral icterus.  Neck: Normal range of motion. Neck supple. No JVD present. No thyromegaly present.  Cardiovascular: Normal  rate, regular rhythm and normal heart sounds.  No murmur heard. No BLE edema. Pulmonary/Chest: Effort normal and breath sounds normal. No respiratory distress. Abdominal: Soft. Bowel sounds are normal, no distension. There is no tenderness. no masses MALE GENITALIA: Deferred RECTAL: Deferred Musculoskeletal: Normal range of motion, no joint effusions. No gross deformities Neurological: he is alert and oriented to person, place, and time. No cranial nerve deficit. Coordination, balance, strength, speech and gait are normal.  Skin: Skin is warm and dry. No rash noted. No erythema.  Psychiatric: Patient has a normal mood and affect. behavior is normal. Judgment and thought content normal.  Recent Results (from the past 2160 hour(s))  PSA     Status: None   Collection Time: 05/14/18 10:37 AM  Result Value Ref Range   Prostatic Specific Antigen 3.28 0.00 - 4.00 ng/mL    Comment: (NOTE) While PSA levels of <=4.0 ng/ml are reported as reference range, some men with levels below 4.0 ng/ml can have prostate cancer and many men with PSA above 4.0 ng/ml do not have prostate cancer.  Other tests such as free PSA, age specific reference ranges, PSA velocity and PSA doubling time may be helpful especially in men less than 7 years old. Performed at [facility], [place] [address] [place], [place] [address]   Hemoglobin A1c     Status: Abnormal   Collection Time: 05/14/18 10:37 AM  Result Value Ref Range   Hgb A1c MFr Bld 5.7 (H) 4.8 - 5.6 %    Comment: (NOTE) Pre diabetes:          5.7%-6.4% Diabetes:              >6.4% Glycemic control for   <7.0% adults with diabetes    Mean Plasma Glucose 116.89 mg/dL    Comment: Performed at [facility] [address] [place], [place] [address]  Lipid panel     Status: Abnormal   Collection Time: 05/14/18 10:37 AM  Result Value Ref Range   Cholesterol 142 0 - 200 mg/dL   Triglycerides 91 <150 mg/dL   HDL 34 (L) >40 mg/dL   Total CHOL/HDL  Ratio 4.2 RATIO   VLDL 18 0 - 40 mg/dL   LDL Cholesterol 90 0 - 99 mg/dL    Comment:        Total Cholesterol/HDL:CHD Risk Coronary Heart Disease Risk Table                     Men   Women  1/2 Average Risk   3.4   3.3  Average Risk       5.0   4.4  2 X Average Risk   9.6   7.1  3 X Average Risk  23.4   11.0        Use the calculated Patient Ratio above and the CHD Risk Table to determine the patient's CHD  Risk.        ATP III CLASSIFICATION (LDL):  <100     mg/dL   Optimal  100-129  mg/dL   Near or Above                    Optimal  130-159  mg/dL   Borderline  160-189  mg/dL   High  >190     mg/dL   Very High Performed at [facility], [address] [place], [place] [address]   CBC with Differential/Platelet     Status: None   Collection Time: 05/14/18 10:37 AM  Result Value Ref Range   WBC 5.0 4.0 - 10.5 K/uL   RBC 4.73 4.22 - 5.81 MIL/uL   Hemoglobin 13.7 13.0 - 17.0 g/dL   HCT 42.6 39.0 - 52.0 %   MCV 90.1 80.0 - 100.0 fL   MCH 29.0 26.0 - 34.0 pg   MCHC 32.2 30.0 - 36.0 g/dL   RDW 13.3 11.5 - 15.5 %   Platelets 253 150 - 400 K/uL   nRBC 0.0 0.0 - 0.2 %   Neutrophils Relative % 58 %   Neutro Abs 2.9 1.7 - 7.7 K/uL   Lymphocytes Relative 31 %   Lymphs Abs 1.5 0.7 - 4.0 K/uL   Monocytes Relative 11 %   Monocytes Absolute 0.6 0.1 - 1.0 K/uL   Eosinophils Relative 0 %   Eosinophils Absolute 0.0 0.0 - 0.5 K/uL   Basophils Relative 0 %   Basophils Absolute 0.0 0.0 - 0.1 K/uL   Immature Granulocytes 0 %   Abs Immature Granulocytes 0.02 0.00 - 0.07 K/uL    Comment: Performed at [facility], [address] [place], [place] [address]  Comprehensive metabolic panel     Status: Abnormal   Collection Time: 05/14/18 10:37 AM  Result Value Ref Range   Sodium 138 135 - 145 mmol/L   Potassium 3.6 3.5 - 5.1 mmol/L   Chloride 102 98 - 111 mmol/L   CO2 30 22 - 32 mmol/L   Glucose, Bld 105 (H) 70 - 99 mg/dL   BUN 16 8 - 23 mg/dL   Creatinine, Ser  0.93 0.61 - 1.24 mg/dL   Calcium 9.4 8.9 - 10.3 mg/dL   Total Protein 7.6 6.5 - 8.1 g/dL   Albumin 4.1 3.5 - 5.0 g/dL   AST 27 15 - 41 U/L   ALT 35 0 - 44 U/L   Alkaline Phosphatase 52 38 - 126 U/L   Total Bilirubin 0.7 0.3 - 1.2 mg/dL   GFR calc non Af Amer >60 >60 mL/min   GFR calc Af Amer >60 >60 mL/min   Anion gap 6 5 - 15    Comment: Performed at [facility], [address] [place], [place] [address]  Hepatitis C antibody     Status: None   Collection Time: 07/30/18  2:41 PM  Result Value Ref Range   Hepatitis C Ab NON-REACTIVE NON-REACTI   SIGNAL TO CUT-OFF 0.64 <1.00    Comment: . HCV antibody was non-reactive. There is no laboratory  evidence of HCV infection. . In most cases, no further action is required. However, if recent HCV exposure is suspected, a test for HCV RNA (test code [id number]) is suggested. . For additional information please refer to http://education.[facility].com/faq/FAQ22v1 (This link is being provided for informational/ educational purposes only.) .   HIV Antibody (routine testing w rflx)     Status: None   Collection  Time: 07/30/18  2:41 PM  Result Value Ref Range   HIV 1&2 Ab, 4th Generation NON-REACTIVE NON-REACTI    Comment: HIV-1 antigen and HIV-1/HIV-2 antibodies were not detected. There is no laboratory evidence of HIV infection. Marland Kitchen PLEASE NOTE: This information has been disclosed to you from records whose confidentiality may be protected by state law.  If your state requires such protection, then the state law prohibits you from making any further disclosure of the information without the specific written consent of the person to whom it pertains, or as otherwise permitted by law. A general authorization for the release of medical or other information is NOT sufficient for this purpose. . For additional information please refer to http://education.[facility].com/faq/FAQ106 (This link is being provided for  informational/ educational purposes only.) . Marland Kitchen The performance of this assay has not been clinically validated in patients less than 57 years old. .   COMPLETE METABOLIC PANEL WITH GFR     Status: Abnormal   Collection Time: 07/30/18  2:41 PM  Result Value Ref Range   Glucose, Bld 100 (H) 65 - 99 mg/dL    Comment: .            Fasting reference interval . For someone without known diabetes, a glucose value between 100 and 125 mg/dL is consistent with prediabetes and should be confirmed with a follow-up test. .    BUN 12 7 - 25 mg/dL   Creat 1.16 0.70 - 1.25 mg/dL    Comment: For patients >71 years of age, the reference limit for Creatinine is approximately 13% higher for people identified as African-American. .    GFR, Est Non African American 66 > OR = 60 mL/min/1.83m2   GFR, Est African American 77 > OR = 60 mL/min/1.17m2   BUN/Creatinine Ratio NOT APPLICABLE 6 - 22 (calc)   Sodium 139 135 - 146 mmol/L   Potassium 3.9 3.5 - 5.3 mmol/L   Chloride 100 98 - 110 mmol/L   CO2 31 20 - 32 mmol/L   Calcium 10.3 8.6 - 10.3 mg/dL   Total Protein 8.0 6.1 - 8.1 g/dL   Albumin 4.3 3.6 - 5.1 g/dL   Globulin 3.7 1.9 - 3.7 g/dL (calc)   AG Ratio 1.2 1.0 - 2.5 (calc)   Total Bilirubin 0.4 0.2 - 1.2 mg/dL   Alkaline phosphatase (APISO) 61 35 - 144 U/L   AST 20 10 - 35 U/L   ALT 29 9 - 46 U/L  Hemoglobin A1c     Status: Abnormal   Collection Time: 07/30/18  2:41 PM  Result Value Ref Range   Hgb A1c MFr Bld 6.3 (H) <5.7 % of total Hgb    Comment: For someone without known diabetes, a hemoglobin  A1c value between 5.7% and 6.4% is consistent with prediabetes and should be confirmed with a  follow-up test. . For someone with known diabetes, a value <7% indicates that their diabetes is well controlled. A1c targets should be individualized based on duration of diabetes, age, comorbid conditions, and other considerations. . This assay result is consistent with an increased risk of  diabetes. . Currently, no consensus exists regarding use of hemoglobin A1c for diagnosis of diabetes for children. .    Mean Plasma Glucose 134 (calc)   eAG (mmol/L) 7.4 (calc)  PSA     Status: None   Collection Time: 07/30/18  2:41 PM  Result Value Ref Range   PSA 2.8 < OR = 4.0 ng/mL    Comment:  The total PSA value from this assay system is  standardized against the WHO standard. The test  result will be approximately 20% lower when compared  to the equimolar-standardized total PSA (Beckman  Coulter). Comparison of serial PSA results should be  interpreted with this fact in mind. . This test was performed using the Siemens  chemiluminescent method. Values obtained from  different assay methods cannot be used interchangeably. PSA levels, regardless of value, should not be interpreted as absolute evidence of the presence or absence of disease.   Lipid panel     Status: Abnormal   Collection Time: 07/30/18  2:41 PM  Result Value Ref Range   Cholesterol 149 <200 mg/dL   HDL 34 (L) > OR = 40 mg/dL   Triglycerides 146 <150 mg/dL   LDL Cholesterol (Calc) 90 mg/dL (calc)    Comment: Reference range: <100 . Desirable range <100 mg/dL for primary prevention;   <70 mg/dL for patients with CHD or diabetic patients  with > or = 2 CHD risk factors. Marland Kitchen LDL-C is now calculated using the Martin-Hopkins  calculation, which is a validated novel method providing  better accuracy than the Friedewald equation in the  estimation of LDL-C.  Cresenciano Genre et al. Annamaria Helling. 9563;875(64): 2061-2068  (http://education.[facility].com/faq/FAQ164)    Total CHOL/HDL Ratio 4.4 <5.0 (calc)   Non-HDL Cholesterol (Calc) 115 <130 mg/dL (calc)    Comment: For patients with diabetes plus 1 major ASCVD risk  factor, treating to a non-HDL-C goal of <100 mg/dL  (LDL-C of <70 mg/dL) is considered a therapeutic  option.   RPR     Status: None   Collection Time: 07/30/18  2:41 PM  Result Value Ref Range    RPR Ser Ql NON-REACTIVE NON-REACTI     PHQ2/9: Depression screen [facility] 2/9 07/30/2018 05/14/2018 01/04/2018 05/30/2017 01/09/2017  Decreased Interest 0 0 0 0 0  Down, Depressed, Hopeless 0 0 0 0 0  PHQ - 2 Score 0 0 0 0 0  Altered sleeping - 0 0 - -  Tired, decreased energy - 0 0 - -  Change in appetite - 0 0 - -  Feeling bad or failure about yourself  - 0 0 - -  Trouble concentrating - 0 0 - -  Moving slowly or fidgety/restless - 0 0 - -  Suicidal thoughts - 0 0 - -  PHQ-9 Score - 0 0 - -  Difficult doing work/chores - Not difficult at all Not difficult at all - -   Fall Risk: Fall Risk  05/14/2018 01/04/2018 05/30/2017 01/09/2017 09/19/2016  Falls in the past year? 0 No No No No    Functional Status Survey: Is the patient deaf or have difficulty hearing?: No Does the patient have difficulty seeing, even when wearing glasses/contacts?: No Does the patient have difficulty concentrating, remembering, or making decisions?: No Does the patient have difficulty walking or climbing stairs?: No Does the patient have difficulty dressing or bathing?: No Does the patient have difficulty doing errands alone such as visiting a doctor's office or shopping?: No    Assessment & Plan   1. Annual physical exam -Prostate cancer screening and PSA options (with potential risks and benefits of testing vs not testing) were discussed along with recent recs/guidelines. -USPSTF grade A and B recommendations reviewed with patient; age-appropriate recommendations, preventive care, screening tests, etc discussed and encouraged; healthy living encouraged; see AVS for patient education given to patient -Discussed importance of 150 minutes of physical activity weekly, eat two  servings of fish weekly, eat one serving of tree nuts ( cashews, pistachios, pecans, almonds.Marland Kitchen) every other day, eat 6 servings of fruit/vegetables daily and drink plenty of water and avoid sweet beverages.   2. Low HDL (under 40) - Lipid  panel  3. Need for hepatitis C screening test - Hepatitis C antibody  4. Encounter for screening for HIV - HIV Antibody (routine testing w rflx)  5. Benign essential HTN - tadalafil (CIALIS) 5 MG tablet; Take 1 tablet (5 mg total) by mouth daily as needed for erectile dysfunction.  Dispense: 90 tablet; Refill: 1  6. Hyperglycemia - COMPLETE METABOLIC PANEL WITH GFR  7. Pre-diabetes - COMPLETE METABOLIC PANEL WITH GFR  8. Diabetes mellitus screening - COMPLETE METABOLIC PANEL WITH GFR - Hemoglobin A1c  9. Prostate cancer screening - PSA  10. Lipid screening - Lipid panel  11. Routine screening for STI (sexually transmitted infection) - HIV Antibody (routine testing w rflx) - Urine cytology ancillary only - RPR  12. Erectile dysfunction of organic origin - tadalafil (CIALIS) 5 MG tablet; Take 1 tablet (5 mg total) by mouth daily as needed for erectile dysfunction.  Dispense: 90 tablet; Refill: 1  13. Erectile dysfunction, unspecified erectile dysfunction type - tadalafil (CIALIS) 5 MG tablet; Take 1 tablet (5 mg total) by mouth daily as needed for erectile dysfunction.  Dispense: 90 tablet; Refill: 1 - We will trial once daily dosing as BP is elevated today and daily dosing has sometimes proven to help more than PRN dosing.

## 2018-07-30 NOTE — Patient Instructions (Signed)
Preventive Care 40-64 Years, Male Preventive care refers to lifestyle choices and visits with your health care provider that can promote health and wellness. What does preventive care include?   A yearly physical exam. This is also called an annual well check.  Dental exams once or twice a year.  Routine eye exams. Ask your health care provider how often you should have your eyes checked.  Personal lifestyle choices, including: ? Daily care of your teeth and gums. ? Regular physical activity. ? Eating a healthy diet. ? Avoiding tobacco and drug use. ? Limiting alcohol use. ? Practicing safe sex. ? Taking low-dose aspirin every day starting at age 50. What happens during an annual well check? The services and screenings done by your health care provider during your annual well check will depend on your age, overall health, lifestyle risk factors, and family history of disease. Counseling Your health care provider may ask you questions about your:  Alcohol use.  Tobacco use.  Drug use.  Emotional well-being.  Home and relationship well-being.  Sexual activity.  Eating habits.  Work and work environment. Screening You may have the following tests or measurements:  Height, weight, and BMI.  Blood pressure.  Lipid and cholesterol levels. These may be checked every 5 years, or more frequently if you are over 50 years old.  Skin check.  Lung cancer screening. You may have this screening every year starting at age 55 if you have a 30-pack-year history of smoking and currently smoke or have quit within the past 15 years.  Colorectal cancer screening. All adults should have this screening starting at age 50 and continuing until age 75. Your health care provider may recommend screening at age 45. You will have tests every 1-10 years, depending on your results and the type of screening test. People at increased risk should start screening at an earlier age. Screening tests may  include: ? Guaiac-based fecal occult blood testing. ? Fecal immunochemical test (FIT). ? Stool DNA test. ? Virtual colonoscopy. ? Sigmoidoscopy. During this test, a flexible tube with a tiny camera (sigmoidoscope) is used to examine your rectum and lower colon. The sigmoidoscope is inserted through your anus into your rectum and lower colon. ? Colonoscopy. During this test, a long, thin, flexible tube with a tiny camera (colonoscope) is used to examine your entire colon and rectum.  Prostate cancer screening. Recommendations will vary depending on your family history and other risks.  Hepatitis C blood test.  Hepatitis B blood test.  Sexually transmitted disease (STD) testing.  Diabetes screening. This is done by checking your blood sugar (glucose) after you have not eaten for a while (fasting). You may have this done every 1-3 years. Discuss your test results, treatment options, and if necessary, the need for more tests with your health care provider. Vaccines Your health care provider may recommend certain vaccines, such as:  Influenza vaccine. This is recommended every year.  Tetanus, diphtheria, and acellular pertussis (Tdap, Td) vaccine. You may need a Td booster every 10 years.  Varicella vaccine. You may need this if you have not been vaccinated.  Zoster vaccine. You may need this after age 60.  Measles, mumps, and rubella (MMR) vaccine. You may need at least one dose of MMR if you were born in 1957 or later. You may also need a second dose.  Pneumococcal 13-valent conjugate (PCV13) vaccine. You may need this if you have certain conditions and have not been vaccinated.  Pneumococcal polysaccharide (PPSV23) vaccine.   and rubella (MMR) vaccine. You may need at least one dose of MMR if you were born in 1957 or later. You may also need a second dose.   Pneumococcal 13-valent conjugate (PCV13) vaccine. You may need this if you have certain conditions and have not been vaccinated.   Pneumococcal polysaccharide (PPSV23) vaccine. You may need one or two doses if you smoke cigarettes or if you have certain conditions.   Meningococcal vaccine. You may need this if you have certain conditions.   Hepatitis A vaccine. You may need this if you have certain conditions or if you travel or work in  places where you may be exposed to hepatitis A.   Hepatitis B vaccine. You may need this if you have certain conditions or if you travel or work in places where you may be exposed to hepatitis B.   Haemophilus influenzae type b (Hib) vaccine. You may need this if you have certain risk factors.  Talk to your health care provider about which screenings and vaccines you need and how often you need them.  This information is not intended to replace advice given to you by your health care provider. Make sure you discuss any questions you have with your health care provider.  Document Released: 06/29/2015 Document Revised: 07/23/2017 Document Reviewed: 04/03/2015  Elsevier Interactive Patient Education  2019 Elsevier Inc[employer]

## 2018-08-02 ENCOUNTER — Encounter: Payer: Self-pay | Admitting: Family Medicine

## 2018-08-02 ENCOUNTER — Other Ambulatory Visit: Payer: Self-pay | Admitting: Family Medicine

## 2018-08-02 ENCOUNTER — Encounter: Payer: No Typology Code available for payment source | Admitting: Family Medicine

## 2018-08-02 DIAGNOSIS — E782 Mixed hyperlipidemia: Secondary | ICD-10-CM

## 2018-08-02 DIAGNOSIS — R7303 Prediabetes: Secondary | ICD-10-CM | POA: Insufficient documentation

## 2018-08-02 LAB — COMPLETE METABOLIC PANEL WITH GFR
AG Ratio: 1.2 (calc) (ref 1.0–2.5)
ALT: 29 U/L (ref 9–46)
AST: 20 U/L (ref 10–35)
Albumin: 4.3 g/dL (ref 3.6–5.1)
Alkaline phosphatase (APISO): 61 U/L (ref 35–144)
BUN: 12 mg/dL (ref 7–25)
CO2: 31 mmol/L (ref 20–32)
Calcium: 10.3 mg/dL (ref 8.6–10.3)
Chloride: 100 mmol/L (ref 98–110)
Creat: 1.16 mg/dL (ref 0.70–1.25)
GFR, EST NON AFRICAN AMERICAN: 66 mL/min/{1.73_m2} (ref >=60)
GFR, Est African American: 77 mL/min/{1.73_m2} (ref >=60)
Globulin: 3.7 g/dL (calc) (ref 1.9–3.7)
Glucose, Bld: 100 mg/dL — ABNORMAL HIGH (ref 65–99)
Potassium: 3.9 mmol/L (ref 3.5–5.3)
Sodium: 139 mmol/L (ref 135–146)
Total Bilirubin: 0.4 mg/dL (ref 0.2–1.2)
Total Protein: 8 g/dL (ref 6.1–8.1)

## 2018-08-02 LAB — HEMOGLOBIN A1C
Hgb A1c MFr Bld: 6.3 % of total Hgb — ABNORMAL HIGH (ref <5.7)
MEAN PLASMA GLUCOSE: 134 (calc)
eAG (mmol/L): 7.4 (calc)

## 2018-08-02 LAB — LIPID PANEL
Cholesterol: 149 mg/dL (ref <200)
HDL: 34 mg/dL — AB (ref >=40)
LDL Cholesterol (Calc): 90 mg/dL (calc)
Non-HDL Cholesterol (Calc): 115 mg/dL (calc) (ref <130)
Total CHOL/HDL Ratio: 4.4 (calc) (ref <5.0)
Triglycerides: 146 mg/dL (ref <150)

## 2018-08-02 LAB — PSA: PSA: 2.8 ng/mL (ref <=4.0)

## 2018-08-02 LAB — URINE CYTOLOGY ANCILLARY ONLY
Chlamydia: NEGATIVE
Neisseria Gonorrhea: NEGATIVE

## 2018-08-02 LAB — HEPATITIS C ANTIBODY
Hepatitis C Ab: NONREACTIVE
SIGNAL TO CUT-OFF: 0.64 (ref <1.00)

## 2018-08-02 LAB — HIV ANTIBODY (ROUTINE TESTING W REFLEX): HIV 1&2 Ab, 4th Generation: NONREACTIVE

## 2018-08-02 LAB — RPR: RPR Ser Ql: NONREACTIVE

## 2018-08-02 MED ORDER — ATORVASTATIN CALCIUM 40 MG PO TABS: 40.0000 mg | ORAL_TABLET | Freq: Every day | ORAL | 3 refills | Status: DC

## 2018-09-03 ENCOUNTER — Telehealth: Payer: Self-pay | Admitting: Family Medicine

## 2018-09-03 NOTE — Telephone Encounter (Signed)
Copied from [place] [phone]. Topic: General - Other >> Sep 03, 2018  2:54 PM Keene Breath wrote: Reason for CRM: Patient called to speak with the nurse or doctor regarding is prescriptions.  Patient did not leave much detail, but wanted to know why he has to call every month in order to get refills.  Pease advise and call patient back at [phone]

## 2018-09-06 ENCOUNTER — Other Ambulatory Visit: Payer: Self-pay | Admitting: Emergency Medicine

## 2018-09-06 DIAGNOSIS — N529 Male erectile dysfunction, unspecified: Secondary | ICD-10-CM

## 2018-09-06 DIAGNOSIS — I10 Essential (primary) hypertension: Secondary | ICD-10-CM

## 2018-09-06 MED ORDER — TADALAFIL 5 MG PO TABS: 5.0000 mg | ORAL_TABLET | Freq: Every day | ORAL | 1 refills | Status: DC | PRN

## 2018-09-06 NOTE — Telephone Encounter (Signed)
Refill sent to [facility] for Cialis

## 2018-11-12 ENCOUNTER — Ambulatory Visit: Payer: No Typology Code available for payment source | Admitting: Family Medicine

## 2018-12-10 ENCOUNTER — Encounter: Payer: Self-pay | Admitting: Family Medicine

## 2018-12-10 DIAGNOSIS — J301 Allergic rhinitis due to pollen: Secondary | ICD-10-CM

## 2018-12-13 MED ORDER — FLUTICASONE PROPIONATE 50 MCG/ACT NA SUSP: 2.0000 | Freq: Every day | NASAL | 0 refills | Status: DC

## 2018-12-13 NOTE — Telephone Encounter (Signed)
Please schedule patient for follow up in the next 30 days.  

## 2019-01-07 ENCOUNTER — Encounter: Payer: Self-pay | Admitting: Family Medicine

## 2019-01-20 ENCOUNTER — Encounter: Payer: Self-pay | Admitting: General Surgery

## 2019-02-28 ENCOUNTER — Telehealth: Payer: Self-pay | Admitting: Family Medicine

## 2019-02-28 DIAGNOSIS — I10 Essential (primary) hypertension: Secondary | ICD-10-CM

## 2019-02-28 DIAGNOSIS — E782 Mixed hyperlipidemia: Secondary | ICD-10-CM

## 2019-02-28 NOTE — Telephone Encounter (Signed)
Error

## 2019-02-28 NOTE — Telephone Encounter (Signed)
Medication: lisinopril-hydrochlorothiazide (PRINZIDE,ZESTORETIC) 20-25 MG tablet   Patient calling from pharmacy to request refill of this medication. Patient states he has not taken medication in x4 days. I advised per [facility] that medication refills could take up to 72 hours. Patient states that he will wait at pharmacy until medication is sent in. Patient requesting call back from office staff as soon as possible.           [place], [place] [phone] (Phone) [phone] (Fax)

## 2019-03-01 MED ORDER — LISINOPRIL-HYDROCHLOROTHIAZIDE 20-25 MG PO TABS: 1.0000 | ORAL_TABLET | Freq: Every day | ORAL | 1 refills | Status: DC

## 2019-03-01 MED ORDER — ATORVASTATIN CALCIUM 40 MG PO TABS: 40.0000 mg | ORAL_TABLET | Freq: Every day | ORAL | 3 refills | Status: DC

## 2019-03-01 NOTE — Telephone Encounter (Signed)
Refill already provided for him. Thanks!

## 2019-03-01 NOTE — Telephone Encounter (Signed)
Pt is currently at  [place], [place]  [id number] [place] [id number]  Phone: [phone] Fax: [phone]   He is requesting that the Rx refills from today's appt be sent to this pharmacy

## 2019-03-01 NOTE — Telephone Encounter (Signed)
Please give enough til appointment in October

## 2019-03-18 ENCOUNTER — Encounter: Payer: Self-pay | Admitting: Family Medicine

## 2019-03-18 ENCOUNTER — Ambulatory Visit (INDEPENDENT_AMBULATORY_CARE_PROVIDER_SITE_OTHER): Payer: No Typology Code available for payment source | Admitting: Family Medicine

## 2019-03-18 ENCOUNTER — Other Ambulatory Visit: Payer: Self-pay

## 2019-03-18 VITALS — Ht 70.0 in | Wt 252.0 lb

## 2019-03-18 DIAGNOSIS — G8929 Other chronic pain: Secondary | ICD-10-CM

## 2019-03-18 DIAGNOSIS — J301 Allergic rhinitis due to pollen: Secondary | ICD-10-CM

## 2019-03-18 DIAGNOSIS — E1169 Type 2 diabetes mellitus with other specified complication: Secondary | ICD-10-CM | POA: Insufficient documentation

## 2019-03-18 DIAGNOSIS — I10 Essential (primary) hypertension: Secondary | ICD-10-CM

## 2019-03-18 DIAGNOSIS — R7303 Prediabetes: Secondary | ICD-10-CM | POA: Diagnosis not present

## 2019-03-18 DIAGNOSIS — M25512 Pain in left shoulder: Secondary | ICD-10-CM

## 2019-03-18 DIAGNOSIS — R972 Elevated prostate specific antigen [PSA]: Secondary | ICD-10-CM | POA: Diagnosis not present

## 2019-03-18 DIAGNOSIS — E782 Mixed hyperlipidemia: Secondary | ICD-10-CM

## 2019-03-18 HISTORY — DX: Type 2 diabetes mellitus with other specified complication: E11.69

## 2019-03-18 MED ORDER — LORATADINE 10 MG PO TABS: 10.0000 mg | ORAL_TABLET | Freq: Every day | ORAL | 3 refills | Status: AC

## 2019-03-18 MED ORDER — FLUTICASONE PROPIONATE 50 MCG/ACT NA SUSP: 2.0000 | Freq: Every day | NASAL | 0 refills | Status: DC

## 2019-03-18 MED ORDER — LISINOPRIL-HYDROCHLOROTHIAZIDE 20-25 MG PO TABS: 1.0000 | ORAL_TABLET | Freq: Every day | ORAL | 3 refills | Status: DC

## 2019-03-18 NOTE — Progress Notes (Signed)
Name: Eric English.   MRN: [id number]    DOB: 14-Apr-1956   Date:03/18/2019       Progress Note  Subjective  Chief Complaint  Chief Complaint  Patient presents with  . Medication Refill    Wants to use [facility] to get medication for free Benefit # [id number]  . Hypertension    Edema in ankles  . Elevated PSA  . Hyperglycemia  . Chronic left shoulder pain    Has gotten better since he has not been working out    I connected with  [name] on 03/18/19 at 10:20 AM EDT by telephone and verified that I am speaking with the correct person using two identifiers.  I discussed the limitations, risks, security and privacy concerns of performing an evaluation and management service by telephone and the availability of in person appointments. Staff also discussed with the patient that there may be a patient responsible charge related to this service. Patient Location: Home Provider Location: Office Additional Individuals present: None  HPI  Pt is living in [place] during the week and comes home on the weekends.    HTN: He is compliant with medication, denies chest pain, shortness of breath, or palpitations. He is due for labs.  Does get some mild ankle edema near the end of the day - goes away with elevation.   HLD: Not taking medication at this time; we will recheck labs today.  Prediabetes: due for labs; He denies polyphagia or polyphagia. Does have some polyuria   Elevated PSA/BPH: He followed up by [facility] back in 2016, he was advised to follow up in one year, but lost to follow up. We will recheck level today and may need to go back. Endorses some polyuria, no nocturia, decreased force of stream, nor does he need to push/strain to start urine stream.  He has not been taking Cialis daily because it did not make a huge difference.   AR: Using flonase and taking loratadine which seems to control his nasal congestion.   Patient Active Problem List   Diagnosis Date Noted  . Prediabetes 08/02/2018  . Chronic left shoulder pain 01/04/2018  . Allergic rhinitis due to pollen 09/19/2016  . Hemorrhoids 07/15/2016  . 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 Elevated liver enzymes 10/08/2015  . Elevated prostate specific antigen (PSA) 07/11/2015  . Screening for gout 07/11/2015  . Superficial thrombophlebitis 06/23/2015  . Allergic rhinitis 12/12/2014  . Diastasis recti 12/27/2013  . Abnormal blood sugar 03/15/2010  . Benign essential HTN 03/15/2010    Past Surgical History:  Procedure Laterality Date  . COLONOSCOPY  2006  . COLONOSCOPY WITH PROPOFOL N/A 08/15/2015   Procedure: COLONOSCOPY WITH PROPOFOL;  Surgeon: Robert Bellow, MD;  Location: [facility] ENDOSCOPY;  Service: Endoscopy;  Laterality: N/A;  . HERNIA REPAIR  [id number]   umbilical  . VASECTOMY  [id number]    Family History  Problem Relation Age of Onset  . Diabetes Mother   . Hypertension Mother   . CAD Mother   . Hyperlipidemia Mother   . Heart disease Father   . CAD Father   . Diabetes Father     Social History   Socioeconomic History  . Marital status: Married    Spouse name: Junious Dresser   . Number of children: 3  . Years of education: Not on file  . Highest education level: Bachelor's degree (e.g., BA, AB, BS)  Occupational History    Employer:  [employer]  Social Needs  . Financial resource strain: Not hard at all  . Food insecurity    Worry: Never true    Inability: Never true  . Transportation needs    Medical: No    Non-medical: No  Tobacco Use  . Smoking status: Former Smoker    Packs/day: 1.00    Years: 24.00    Pack years: 24.00    Types: Cigarettes  . Smokeless tobacco: Never Used  Substance and Sexual Activity  . Alcohol use: Yes    Alcohol/week: 0.0 standard drinks    Comment: Occasionally  . Drug use: No  . Sexual activity: Not on file  Lifestyle  . Physical activity    Days per week: 6 days    Minutes per session: 120 min   . Stress: Not at all  Relationships  . Social connections    Talks on phone: More than three times a week    Gets together: Once a week    Attends religious service: More than 4 times per year    Active member of club or organization: Yes    Attends meetings of clubs or organizations: Never    Relationship status: Married  . Intimate partner violence    Fear of current or ex partner: No    Emotionally abused: No    Physically abused: No    Forced sexual activity: No  Other Topics Concern  . Not on file  Social History Narrative   Married, wife and children live in [place], he works in [place] , retired from [employer] and also from [place]     Current Outpatient Medications:  .  B Complex Vitamins (VITAMIN B COMPLEX PO), Take 1 tablet by mouth daily., Disp: , Rfl:  .  fluticasone (FLONASE) 50 MCG/ACT nasal spray, Place 2 sprays into both nostrils daily., Disp: 16 g, Rfl: 0 .  lisinopril-hydrochlorothiazide (ZESTORETIC) 20-25 MG tablet, Take 1 tablet by mouth daily., Disp: 90 tablet, Rfl: 1 .  Omega-3 Fatty Acids (FISH OIL) 1200 MG CAPS, Take 1 capsule by mouth 2 (two) times daily., Disp: , Rfl:  .  tadalafil (CIALIS) 5 MG tablet, Take 1 tablet (5 mg total) by mouth daily as needed for erectile dysfunction., Disp: 90 tablet, Rfl: 1 .  atorvastatin (LIPITOR) 40 MG tablet, Take 1 tablet (40 mg total) by mouth daily. (Patient not taking: Reported on 03/18/2019), Disp: 90 tablet, Rfl: 3  No Known Allergies  I personally reviewed active problem list, medication list, allergies, health maintenance, notes from last encounter, lab results with the patient/caregiver today.   ROS  Ten systems reviewed and is negative except as mentioned in HPI  Objective  Virtual encounter, vitals not obtained.  Body mass index is 36.16 kg/m.  Physical Exam  Pulmonary/Chest: Effort normal. No respiratory distress. Speaking in complete sentences Neurological: Pt is alert and oriented to person,  place, and time. Speech is normal Psychiatric: Patient has a normal mood and affect. behavior is normal. Judgment and thought content normal.  No results found for this or any previous visit (from the past 72 hour(s)).  PHQ2/9: Depression screen [facility] 2/9 03/18/2019 07/30/2018 05/14/2018 01/04/2018 05/30/2017  Decreased Interest 0 0 0 0 0  Down, Depressed, Hopeless 0 0 0 0 0  PHQ - 2 Score 0 0 0 0 0  Altered sleeping 0 - 0 0 -  Tired, decreased energy 0 - 0 0 -  Change in appetite 0 - 0 0 -  Feeling bad or failure about yourself  0 - 0 0 -  Trouble concentrating 0 - 0 0 -  Moving slowly or fidgety/restless 0 - 0 0 -  Suicidal thoughts 0 - 0 0 -  PHQ-9 Score 0 - 0 0 -  Difficult doing work/chores Not difficult at all - Not difficult at all Not difficult at all -   PHQ-2/9 Result is negative.    Fall Risk: Fall Risk  03/18/2019 05/14/2018 01/04/2018 05/30/2017 01/09/2017  Falls in the past year? 0 0 No No No  Number falls in past yr: 0 - - - -  Injury with Fall? 0 - - - -    Assessment & Plan  1. Benign essential HTN - Will send me his BP - lisinopril-hydrochlorothiazide (ZESTORETIC) 20-25 MG tablet; Take 1 tablet by mouth daily.  Dispense: 90 tablet; Refill: 3  2. Elevated prostate specific antigen (PSA) - PSA  3. Prediabetes - COMPLETE METABOLIC PANEL WITH GFR - Hemoglobin A1c  4. Mixed hyperlipidemia - Lipid panel  5. Chronic seasonal allergic rhinitis due to pollen - fluticasone (FLONASE) 50 MCG/ACT nasal spray; Place 2 sprays into both nostrils daily.  Dispense: 16 g; Refill: 0 - loratadine (CLARITIN) 10 MG tablet; Take 1 tablet (10 mg total) by mouth daily.  Dispense: 90 tablet; Refill: 3   I discussed the assessment and treatment plan with the patient. The patient was provided an opportunity to ask questions and all were answered. The patient agreed with the plan and demonstrated an understanding of the instructions.   The patient was advised to call back or seek an  in-person evaluation if the symptoms worsen or if the condition fails to improve as anticipated.  I provided 16 minutes of non-face-to-face time during this encounter.  Hubbard Hartshorn, FNP

## 2019-03-19 ENCOUNTER — Other Ambulatory Visit
Admission: RE | Admit: 2019-03-19 | Discharge: 2019-03-19 | Disposition: A | Discharge status: Home / Self Care | Payer: No Typology Code available for payment source | Attending: Family Medicine | Admitting: Family Medicine

## 2019-03-19 ENCOUNTER — Encounter: Payer: Self-pay | Admitting: Family Medicine

## 2019-03-19 DIAGNOSIS — E782 Mixed hyperlipidemia: Secondary | ICD-10-CM | POA: Insufficient documentation

## 2019-03-19 DIAGNOSIS — I10 Essential (primary) hypertension: Secondary | ICD-10-CM | POA: Diagnosis present

## 2019-03-19 DIAGNOSIS — R7303 Prediabetes: Secondary | ICD-10-CM | POA: Insufficient documentation

## 2019-03-19 DIAGNOSIS — R972 Elevated prostate specific antigen [PSA]: Secondary | ICD-10-CM

## 2019-03-19 LAB — COMPREHENSIVE METABOLIC PANEL
ALT: 38 U/L (ref 0–44)
AST: 26 U/L (ref 15–41)
Albumin: 4 g/dL (ref 3.5–5.0)
Alkaline Phosphatase: 54 U/L (ref 38–126)
Anion gap: 8 (ref 5–15)
BUN: 12 mg/dL (ref 8–23)
CO2: 31 mmol/L (ref 22–32)
Calcium: 9.6 mg/dL (ref 8.9–10.3)
Chloride: 102 mmol/L (ref 98–111)
Creatinine, Ser: 0.84 mg/dL (ref 0.61–1.24)
GFR calc Af Amer: >60 mL/min (ref >60)
GFR calc non Af Amer: >60 mL/min (ref >60)
Glucose, Bld: 101 mg/dL — ABNORMAL HIGH (ref 70–99)
Potassium: 4.2 mmol/L (ref 3.5–5.1)
Sodium: 141 mmol/L (ref 135–145)
Total Bilirubin: 0.7 mg/dL (ref 0.3–1.2)
Total Protein: 7.9 g/dL (ref 6.5–8.1)

## 2019-03-19 LAB — LIPID PANEL
Cholesterol: 153 mg/dL (ref 0–200)
HDL: 37 mg/dL — ABNORMAL LOW (ref >40)
LDL Cholesterol: 91 mg/dL (ref 0–99)
Total CHOL/HDL Ratio: 4.1 RATIO
Triglycerides: 124 mg/dL (ref <150)
VLDL: 25 mg/dL (ref 0–40)

## 2019-03-19 LAB — HEMOGLOBIN A1C
Hgb A1c MFr Bld: 6.1 % — ABNORMAL HIGH (ref 4.8–5.6)
Mean Plasma Glucose: 128.37 mg/dL

## 2019-03-19 LAB — PSA: Prostatic Specific Antigen: 2.67 ng/mL (ref 0.00–4.00)

## 2019-08-08 ENCOUNTER — Encounter: Payer: Self-pay | Admitting: Internal Medicine

## 2019-08-11 NOTE — Progress Notes (Deleted)
Name: Eric English.   MRN: [id number]    DOB: Mar 20, 1956   Date:08/11/2019       Progress Note  Subjective  Chief Complaint  No chief complaint on file.   HPI Patient presents for annual CPE.  Patient is a 64 y.o. male whose last visit was with [name] by phone on 03/18/2019.  Pt is living in [place] during the week and comes home to this area on the weekends.    Problems recently managed include:  HTN - noted compliant with medication Zestoretic, denied chest pain, shortness of breath, palpitations Does get some mild ankle edema near the end of the day - goes away with elevation. BP Readings from Last 3 Encounters:  07/30/18 (!) 150/60  05/14/18 118/60  01/04/18 134/76   HLD - noted compliant with medication Lipitor 40mg  and Omega 3 capsule Lab Results  Component Value Date   CHOL 153 03/19/2019   HDL 37 (L) 03/19/2019   LDLCALC 91 03/19/2019   TRIG 124 03/19/2019   CHOLHDL 4.1 03/19/2019    Prediabetes Lab Results  Component Value Date   HGBA1C 6.1 (H) 03/19/2019   Elevated PSA/BPH - had seen [facility] in 2016, he was advised to follow up in one year, but lost to follow up. In October, noted no nocturia, hesitancy, urgency and not taking cialis daily as felt not make a huge difference  Lab Results  Component Value Date   PSA1 3.6 12/25/2014   PSA 2.8 07/30/2018    Allergic Rhinitis - Using flonase and taking loratadine which helps USPSTF grade A and B recommendations:  Diet: ***    notes tries to watch and eat healthy, not adherent to a very strict specific diet Exercise: ***  notes no regular exercise  Depression: phq 9 is {gen pos [id number] Depression screen [facility] 2/9 03/18/2019 07/30/2018 05/14/2018 01/04/2018 05/30/2017  Decreased Interest 0 0 0 0 0  Down, Depressed, Hopeless 0 0 0 0 0  PHQ - 2 Score 0 0 0 0 0  Altered sleeping 0 - 0 0 -  Tired, decreased energy 0 - 0 0 -  Change in appetite 0 - 0 0 -  Feeling bad or failure about yourself  0  - 0 0 -  Trouble concentrating 0 - 0 0 -  Moving slowly or fidgety/restless 0 - 0 0 -  Suicidal thoughts 0 - 0 0 -  PHQ-9 Score 0 - 0 0 -  Difficult doing work/chores Not difficult at all - Not difficult at all Not difficult at all -    Hypertension:  BP Readings from Last 3 Encounters:  07/30/18 (!) 150/60  05/14/18 118/60  01/04/18 134/76    Obesity: Wt Readings from Last 3 Encounters:  03/18/19 252 lb (114.3 kg)  07/30/18 251 lb 14.4 oz (114.3 kg)  05/14/18 250 lb 9.6 oz (113.7 kg)   BMI Readings from Last 3 Encounters:  03/18/19 36.16 kg/m  07/30/18 37.20 kg/m  05/14/18 35.96 kg/m     Lipids:  Lab Results  Component Value Date   CHOL 153 03/19/2019   CHOL 149 07/30/2018   CHOL 142 05/14/2018   Lab Results  Component Value Date   HDL 37 (L) 03/19/2019   HDL 34 (L) 07/30/2018   HDL 34 (L) 05/14/2018   Lab Results  Component Value Date   LDLCALC 91 03/19/2019   LDLCALC 90 07/30/2018   [place] 90 05/14/2018   Lab Results  Component Value Date  TRIG 124 03/19/2019   TRIG 146 07/30/2018   TRIG 91 05/14/2018   Lab Results  Component Value Date   CHOLHDL 4.1 03/19/2019   CHOLHDL 4.4 07/30/2018   CHOLHDL 4.2 05/14/2018   No results found for: LDLDIRECT  Glucose:  Glucose, Bld  Date Value Ref Range Status  03/19/2019 101 (H) 70 - 99 mg/dL Final  07/30/2018 100 (H) 65 - 99 mg/dL Final    Comment:    .            Fasting reference interval . For someone without known diabetes, a glucose value between 100 and 125 mg/dL is consistent with prediabetes and should be confirmed with a follow-up test. .   05/14/2018 105 (H) 70 - 99 mg/dL Final      Office Visit from 07/30/2018 in [facility]  AUDIT-C Score  0     ***  Married STD testing and prevention (HIV/chl/gon/syphilis): Screening completed 07/2018 including RPR, HIV, GC/chlam were neg Hep C:  Lab Results  Component Value Date   HEPCAB NON-REACTIVE 07/30/2018    Skin cancer: Has had several moles removed by dermatology in the past. No new concerning lesions, declines referral at this time. Discussed monitoring for atypical lesions, following ABCDE's  Colorectal cancer: Denies family or personal history of colorectal cancer, no changes in BM's - no blood in stool, dark and tarry stool, mucus in stool, or constipation/diarrhea. Does have hemorrhoids.  Last colonoscopy 08/15/15 and polyp removed was benign, f/u rec'ed in 5 years  *** Prostate cancer:  Neg FH, Has had elevated PSA in the past - was monitored by [facility] urology for about a year as noted above.Following PSA's,  + h/o ED with change from prn cialis to daily cialis not felt to make a big difference and stopped , *** Lab Results  Component Value Date   PSA 2.8 07/30/2018    IPSS Questionnaire (AUA-7): Over the past month.   1)  How often have you had a sensation of not emptying your bladder completely after you finish urinating?  {Rating:19227}  2)  How often have you had to urinate again less than two hours after you finished urinating? {Rating:19227}  3)  How often have you found you stopped and started again several times when you urinated?  {Rating:19227}  4) How difficult have you found it to postpone urination?  {Rating:19227}  5) How often have you had a weak urinary stream?  {Rating:19227}  6) How often have you had to push or strain to begin urination?  {Rating:19227}  7) How many times did you most typically get up to urinate from the time you went to bed until the time you got up in the morning?  {Rating:19228}  Total score:  0-7 mildly symptomatic   8-19 moderately symptomatic   20-35 severely symptomatic   Score  -   (last year was 3)  Lung cancer:  Former smoker - smoked 25 years x1ppd. Quit in 2009. Low Dose CT Chest recommended if Age 59-80 years, 30 pack-year currently smoking OR have quit w/in 15years. Patient does not qualify.   AAA: ***  - The USPSTF recommends one-time  screening with ultrasonography in men ages 61 to 49 years who have ever smoked. Noted could check this year -  ECG:  ***  No concerning sx's   Advanced Care Planning: A voluntary discussion about advance care planning including the explanation and discussion of advance directives.  Discussed health care proxy and Living  will, and the patient was able to identify a health care proxy ashis wife - Jakwon Hacke ***.  Patient does not  {DOES_DOES [id number] have a living will at present time. If patient does have living will, I have requested they bring this to the clinic to be scanned in to their chart.  Patient Active Problem List   Diagnosis Date Noted  . Mixed hyperlipidemia 03/18/2019  . Prediabetes 08/02/2018  . Chronic left shoulder pain 01/04/2018  . Chronic seasonal allergic rhinitis due to pollen 09/19/2016  . Hemorrhoids 07/15/2016  . 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 Elevated liver enzymes 10/08/2015  . Elevated prostate specific antigen (PSA) 07/11/2015  . Screening for gout 07/11/2015  . Superficial thrombophlebitis 06/23/2015  . Allergic rhinitis 12/12/2014  . Diastasis recti 12/27/2013  . Abnormal blood sugar 03/15/2010  . Benign essential HTN 03/15/2010    Past Surgical History:  Procedure Laterality Date  . COLONOSCOPY  2006  . COLONOSCOPY WITH PROPOFOL N/A 08/15/2015   Procedure: COLONOSCOPY WITH PROPOFOL;  Surgeon: Robert Bellow, MD;  Location: [facility] ENDOSCOPY;  Service: Endoscopy;  Laterality: N/A;  . HERNIA REPAIR  [id number]   umbilical  . VASECTOMY  [id number]    Family History  Problem Relation Age of Onset  . Diabetes Mother   . Hypertension Mother   . CAD Mother   . Hyperlipidemia Mother   . Heart disease Father   . CAD Father   . Diabetes Father     Social History   Socioeconomic History  . Marital status: Married    Spouse name: Junious Dresser   . Number of children: 3  . Years of education: Not on file  . Highest education level: Bachelor's  degree (e.g., BA, AB, BS)  Occupational History    Employer: [employer]  Tobacco Use  . Smoking status: Former Smoker    Packs/day: 1.00    Years: 24.00    Pack years: 24.00    Types: Cigarettes  . Smokeless tobacco: Never Used  Substance and Sexual Activity  . Alcohol use: Yes    Alcohol/week: 0.0 standard drinks    Comment: Occasionally  . Drug use: No  . Sexual activity: Not on file  Other Topics Concern  . Not on file  Social History Narrative   Married, wife and children live in [place], he works in [place] , retired from [employer] and also from [employer] Strain:   . Difficulty of Paying Living Expenses: Not on file  Food Insecurity:   . Worried About Charity fundraiser in the Last Year: Not on file  . Ran Out of Food in the Last Year: Not on file  Transportation Needs:   . Lack of Transportation (Medical): Not on file  . Lack of Transportation (Non-Medical): Not on file  Physical Activity:   . Days of Exercise per Week: Not on file  . Minutes of Exercise per Session: Not on file  Stress:   . Feeling of Stress : Not on file  Social Connections:   . Frequency of Communication with Friends and Family: Not on file  . Frequency of Social Gatherings with Friends and Family: Not on file  . Attends Religious Services: Not on file  . Active Member of Clubs or Organizations: Not on file  . Attends Archivist Meetings: Not on file  . Marital Status: Not on file  Intimate Partner Violence:   .  Fear of Current or Ex-Partner: Not on file  . Emotionally Abused: Not on file  . Physically Abused: Not on file  . Sexually Abused: Not on file     Current Outpatient Medications:  .  B Complex Vitamins (VITAMIN B COMPLEX PO), Take 1 tablet by mouth daily., Disp: , Rfl:  .  fluticasone (FLONASE) 50 MCG/ACT nasal spray, Place 2 sprays into both nostrils daily., Disp: 16 g, Rfl: 0 .   lisinopril-hydrochlorothiazide (ZESTORETIC) 20-25 MG tablet, Take 1 tablet by mouth daily., Disp: 90 tablet, Rfl: 3 .  loratadine (CLARITIN) 10 MG tablet, Take 1 tablet (10 mg total) by mouth daily., Disp: 90 tablet, Rfl: 3 .  Omega-3 Fatty Acids (FISH OIL) 1200 MG CAPS, Take 1 capsule by mouth 2 (two) times daily., Disp: , Rfl:  .  tadalafil (CIALIS) 5 MG tablet, Take 1 tablet (5 mg total) by mouth daily as needed for erectile dysfunction., Disp: 90 tablet, Rfl: 1  No Known Allergies   ROS: As noted above in HPI Constitutional: Negative for fever or weight change.  Respiratory: Negative for cough and shortness of breath.   Cardiovascular: Negative for chest pain or palpitations.  Gastrointestinal: Negative for abdominal pain, no bowel changes.  Musculoskeletal: Negative for gait problem or joint swelling.  Skin: Negative for rash.  Neurological: Negative for dizziness or headache.  No other specific complaints in a complete review of systems (except as listed in HPI above).    Objective  There were no vitals filed for this visit.  There is no height or weight on file to calculate BMI.  NAD HEENT - sclera anicteric, PERRL, EOMI, conj - non-inj'ed, No sinus tenderness, TM's and canals clear, pharynx clear Neck - supple, no adenopathy, no TM, no JVD, carotids 2+ and = without bruits bilat Car - RRR without m/g/r Pulm- CTA without wheeze or rales Abd - soft, NT, ND, BS+, no obvious HSM, no masses Back - no CVA tenderness, no spinal tenderness Skin- no new lesions of concern on exposed areas, denied otherwise Ext - no LE edema, no active joints GU - no swelling in inguinal/suprapubic region, NT  Normal descended testes bilaterally, no masses palpated, no hernias, no lesions, no discharge Rectal: not done, prostate exam deferred (without concerning sx's and after discussion on current recommendations for prostate CA screening including PSA tests) Neuro - affect was not flat,  appropriate with conversation  CN's II-XII intact  Grossly non-focal with good strength on testing, sensation intact to LT in distal extremities, DTR's 2+ and = patella, Romberg neg, no pronator drift, good balance on one foot, good finger to nose    No results found for this or any previous visit (from the past 2160 hour(s)).   PHQ2/9: Depression screen [facility] 2/9 03/18/2019 07/30/2018 05/14/2018 01/04/2018 05/30/2017  Decreased Interest 0 0 0 0 0  Down, Depressed, Hopeless 0 0 0 0 0  PHQ - 2 Score 0 0 0 0 0  Altered sleeping 0 - 0 0 -  Tired, decreased energy 0 - 0 0 -  Change in appetite 0 - 0 0 -  Feeling bad or failure about yourself  0 - 0 0 -  Trouble concentrating 0 - 0 0 -  Moving slowly or fidgety/restless 0 - 0 0 -  Suicidal thoughts 0 - 0 0 -  PHQ-9 Score 0 - 0 0 -  Difficult doing work/chores Not difficult at all - Not difficult at all Not difficult at all -   ***  Fall Risk: Fall Risk  03/18/2019 05/14/2018 01/04/2018 05/30/2017 01/09/2017  Falls in the past year? 0 0 No No No  Number falls in past yr: 0 - - - -  Injury with Fall? 0 - - - -   ***   Functional Status Survey:   ***   Assessment & Plan  1. General Health/Health Maintenance:   -Prostate cancer screening and PSA options (with potential risks and benefits of testing vs not testing) were discussed along with recent recs/guidelines. Have been following PSA's yearly. PSA ordered.  -USPSTF grade A and B recommendations reviewed with patient; age-appropriate recommendations, preventive care, screening tests, etc discussed and encouraged; healthy living encouraged; see AVS for any further patient education given to patient  -Discussed importance of regular physical activity weekly,   -Discussed importance of eating healthy diet:  eating lean meats and proteins, avoiding trans fats and saturated fats, avoid simple sugars and excessive carbs in diet, eating servings of fruit/vegetables daily and staying  hydrated including drink plenty of water and avoid sweet beverages.   -Reviewed Health Maintenance: *** Health Maintenance  Topic Date Due  . INFLUENZA VACCINE  01/15/2019  . PNA vac Low Risk Adult (1 of 2 - PCV13) 07/28/2019  . TETANUS/TDAP  05/31/2022  . COLONOSCOPY  08/14/2025  . Hepatitis C Screening  Completed  . HIV Screening  Completed   Inf vaccine -  Pneumocccal vaccine rec'ed - PPSV23 Next colonoscopy due in 08/2020

## 2019-08-12 ENCOUNTER — Encounter: Payer: Self-pay | Admitting: Internal Medicine

## 2019-08-12 ENCOUNTER — Encounter: Payer: No Typology Code available for payment source | Admitting: Internal Medicine

## 2019-08-12 ENCOUNTER — Ambulatory Visit (INDEPENDENT_AMBULATORY_CARE_PROVIDER_SITE_OTHER): Payer: No Typology Code available for payment source | Admitting: Internal Medicine

## 2019-08-12 ENCOUNTER — Other Ambulatory Visit: Payer: Self-pay

## 2019-08-12 VITALS — BP 124/70 | HR 71 | Temp 97.7°F | Resp 16 | Ht 70.0 in | Wt 258.1 lb

## 2019-08-12 DIAGNOSIS — R7303 Prediabetes: Secondary | ICD-10-CM | POA: Diagnosis not present

## 2019-08-12 DIAGNOSIS — G4733 Obstructive sleep apnea (adult) (pediatric): Secondary | ICD-10-CM | POA: Insufficient documentation

## 2019-08-12 DIAGNOSIS — I10 Essential (primary) hypertension: Secondary | ICD-10-CM

## 2019-08-12 DIAGNOSIS — Z23 Encounter for immunization: Secondary | ICD-10-CM

## 2019-08-12 DIAGNOSIS — Z125 Encounter for screening for malignant neoplasm of prostate: Secondary | ICD-10-CM | POA: Diagnosis not present

## 2019-08-12 DIAGNOSIS — Z136 Encounter for screening for cardiovascular disorders: Secondary | ICD-10-CM | POA: Diagnosis not present

## 2019-08-12 DIAGNOSIS — Z87898 Personal history of other specified conditions: Secondary | ICD-10-CM | POA: Insufficient documentation

## 2019-08-12 DIAGNOSIS — N529 Male erectile dysfunction, unspecified: Secondary | ICD-10-CM

## 2019-08-12 DIAGNOSIS — E782 Mixed hyperlipidemia: Secondary | ICD-10-CM

## 2019-08-12 DIAGNOSIS — Z131 Encounter for screening for diabetes mellitus: Secondary | ICD-10-CM | POA: Diagnosis not present

## 2019-08-12 DIAGNOSIS — J301 Allergic rhinitis due to pollen: Secondary | ICD-10-CM

## 2019-08-12 NOTE — Patient Instructions (Signed)

## 2019-08-12 NOTE — Progress Notes (Signed)
Name: Eric English. MRN: [id number]  DOB: 1956-01-26  Date:08/12/2019  Progress Note   HPI: Patient presents for annual CPE.  Patient is a 64 y.o. male whose last visit was with [name] by phone on 03/18/2019.  Pt is living/working in [place] during the week and comes home to this area on the weekends.   Problems recently managed include:   HTN - noted compliant with medication Zestoretic, denied chest pain, shortness of breath, palpitations, Does get some mild ankle edema near the end of the day - goes away with elevation, no increase recent past     BP Readings from Last 3 Encounters:  07/30/18 (!) 150/60  05/14/18 118/60  01/04/18 134/76   HLD - noted compliant with medications -  Lipitor 40mg  and Omega 3 capsule       Lab Results  Component Value Date   CHOL 153 03/19/2019   HDL 37 (L) 03/19/2019   LDLCALC 91 03/19/2019   TRIG 124 03/19/2019   CHOLHDL 4.1 03/19/2019   Prediabetes       Lab Results  Component Value Date   HGBA1C 6.1 (H) 03/19/2019   Elevated PSA/BPH - had seen [facility] in 2016, he was advised to follow up in one year, but lost to follow up. Had biopsies in past and all ok.  Denies  nocturia, hesitancy, urgency and not taking cialis daily as felt not make a huge difference, not use prn much either      Lab Results  Component Value Date   PSA1 3.6 12/25/2014   PSA 2.8 07/30/2018   Allergic Rhinitis - Not using flonase, taking loratadine which helps   Uses CPAP for sleep apnea  USPSTF grade A and B recommendations:  Diet: notes occasionally tries to watch and eat healthy when can, not adherent to a very strict specific diet  Exercise: mostly resistance training, a lot of walking and biking when can, limited with gym due to Covid in recent past    Office Visit from 08/12/2019 in [facility]  PHQ-9 Total Score  1    phq9 - neg   Depression screen [facility] 2/9 03/18/2019 07/30/2018 05/14/2018 01/04/2018 05/30/2017  Decreased  Interest 0 0 0 0 0  Down, Depressed, Hopeless 0 0 0 0 0  PHQ - 2 Score 0 0 0 0 0  Altered sleeping 0 - 0 0 -  Tired, decreased energy 0 - 0 0 -  Change in appetite 0 - 0 0 -  Feeling bad or failure about yourself  0 - 0 0 -  Trouble concentrating 0 - 0 0 -  Moving slowly or fidgety/restless 0 - 0 0 -  Suicidal thoughts 0 - 0 0 -  PHQ-9 Score 0 - 0 0 -  Difficult doing work/chores Not difficult at all - Not difficult at all Not difficult at all -    Obesity:     Wt Readings from Last 3 Encounters:  03/18/19 252 lb (114.3 kg)  07/30/18 251 lb 14.4 oz (114.3 kg)  05/14/18 250 lb 9.6 oz (113.7 kg)      BMI Readings from Last 3 Encounters:  03/18/19 36.16 kg/m  07/30/18 37.20 kg/m  05/14/18 35.96 kg/m    Recent Labs       Lab Results  Component Value Date   HDL 37 (L) 03/19/2019   HDL 34 (L) 07/30/2018   HDL 34 (L) 05/14/2018   Recent Labs  Lab Results  Component Value Date   LDLCALC 91 03/19/2019   LDLCALC 90 07/30/2018   LDLCALC 90 05/14/2018   Recent Labs       Lab Results  Component Value Date   TRIG 124 03/19/2019   TRIG 146 07/30/2018   TRIG 91 05/14/2018   Recent Labs       Lab Results  Component Value Date   CHOLHDL 4.1 03/19/2019   CHOLHDL 4.4 07/30/2018   CHOLHDL 4.2 05/14/2018   Recent Labs  No results found for: LDLDIRECT   Glucose:  Last Labs         Glucose, Bld  Date Value Ref Range Status  03/19/2019 101 (H) 70 - 99 mg/dL Final  07/30/2018 100 (H) 65 - 99 mg/dL Final    Comment:    .  Fasting reference interval  .  For someone without known diabetes, a glucose value  between 100 and 125 mg/dL is consistent with  prediabetes and should be confirmed with a  follow-up test.  .   05/14/2018 105 (H) 70 - 99 mg/dL Final    Office Visit from 07/30/2018 in [facility]  AUDIT-C Score 0     No regular alcohol use Tob - quit in 1999  Married  STD testing and prevention (HIV/chl/gon/syphilis): Screening  completed 07/2018 including RPR, HIV, GC/chlam were neg, no repeat needed per patient  Hep C:  Recent Labs       Lab Results  Component Value Date   HEPCAB NON-REACTIVE 07/30/2018   Skin cancer: Has had several moles removed by dermatology in the past. No new concerning lesions,  Discussed monitoring for atypical lesions, following ABCDE's   Colorectal cancer: Denies family or personal history of colorectal cancer, no changes in BM's - no blood in stool, dark and tarry stool, mucus in stool, or constipation/diarrhea. Does have hemorrhoids. Last colonoscopy 08/15/15 and polyp removed was benign, f/u rec'ed in 5 years   Prostate cancer: Neg FH, Has had elevated PSA in the past - was monitored by [facility] urology for about a year as noted above.Following PSA's,  + h/o ED with change from prn cialis to daily cialis not felt to make a big difference and stopped    Recent Labs       Lab Results  Component Value Date   PSA 2.8 07/30/2018  IPSS Questionnaire (AUA-7): Over the past month.   1)  How often have you had a sensation of not emptying your bladder completely after you finish urinating?  0  2)  How often have you had to urinate again less than two hours after you finished urinating? 2  3)  How often have you found you stopped and started again several times when you urinated?  1  4) How difficult have you found it to postpone urination?  0  5) How often have you had a weak urinary stream?  3  6) How often have you had to push or strain to begin urination?  0  7) How many times did you most typically get up to urinate from the time you went to bed until the time you got up in the morning?  0  Total score:  0-7 mildly symptomatic   8-19 moderately symptomatic   20-35 severely symptomatic  3 last year 6 this year  Lung cancer: Former smoker - smoked 25 years x1ppd. Quit in 1999. Low Dose CT Chest not recommended, Patient does not qualify.   AAA: Neg  FH, The USPSTF recommends one-time  screening with ultrasonography in men ages 59 to 23 years who have ever smoked. Noted could check this year and he wanted to pursue. Ordered.   ECG:  No concerning sx's   Advanced Care Planning: A voluntary discussion about advance care planning including the explanation and discussion of advance directives. Discussed health care proxy and Living will, and the patient was able to identify a health care proxy as his wife - Rajveer Hanlin. Patient does have a living will at present time.  I have requested he bring this to the clinic to be scanned in to their chart.       Patient Active Problem List   Diagnosis Date Noted  . Mixed hyperlipidemia 03/18/2019  . Prediabetes 08/02/2018  . Chronic left shoulder pain 01/04/2018  . Chronic seasonal allergic rhinitis due to pollen 09/19/2016  . Hemorrhoids 07/15/2016  . 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 Elevated liver enzymes 10/08/2015  . Elevated prostate specific antigen (PSA) 07/11/2015  . Screening for gout 07/11/2015  . Superficial thrombophlebitis 06/23/2015  . Allergic rhinitis 12/12/2014  . Diastasis recti 12/27/2013  . Abnormal blood sugar 03/15/2010  . Benign essential HTN 03/15/2010        Past Surgical History:  Procedure Laterality Date  . COLONOSCOPY  2006  . COLONOSCOPY WITH PROPOFOL N/A 08/15/2015   Procedure: COLONOSCOPY WITH PROPOFOL; Surgeon: Robert Bellow, MD; Location: [facility]; Service: Endoscopy; Laterality: N/A;  . HERNIA REPAIR  [id number]   umbilical  . VASECTOMY  [id number]        Family History  Problem Relation Age of Onset  . Diabetes Mother   . Hypertension Mother   . CAD Mother   . Hyperlipidemia Mother   . Heart disease Father   . CAD Father   . Diabetes Father    Social History        Socioeconomic History  . Marital status: Married    Spouse name: Junious Dresser   . Number of children: 3  . Years of education: Not on file  . Highest education level: Bachelor's degree (e.g., BA, AB, BS)   Occupational History    Employer: [employer]  Tobacco Use  . Smoking status: Former Smoker    Packs/day: 1.00    Years: 24.00    Pack years: 24.00    Types: Cigarettes  . Smokeless tobacco: Never Used  Substance and Sexual Activity  . Alcohol use: Yes    Alcohol/week: 0.0 standard drinks    Comment: Occasionally  . Drug use: No  . Sexual activity: Not on file  Other Topics Concern  . Not on file  Social History Narrative   Married, wife and children live in [place], he works in [place] , retired from [employer] and also from [employer] Strain:   . Difficulty of Paying Living Expenses: Not on file  Food Insecurity:   . Worried About Charity fundraiser in the Last Year: Not on file  . Ran Out of Food in the Last Year: Not on file  Transportation Needs:   . Lack of Transportation (Medical): Not on file  . Lack of Transportation (Non-Medical): Not on file  Physical Activity:   . Days of Exercise per Week: Not on file  . Minutes of Exercise per Session: Not on file  Stress:   . Feeling of Stress : Not on file  Social Connections:   . Frequency of Communication with Friends and Family: Not on file  . Frequency of Social Gatherings with Friends and Family: Not on file  . Attends Religious Services: Not on file  . Active Member of Clubs or Organizations: Not on file  . Attends Archivist Meetings: Not on file  . Marital Status: Not on file  Intimate Partner Violence:   . Fear of Current or Ex-Partner: Not on file  . Emotionally Abused: Not on file  . Physically Abused: Not on file  . Sexually Abused: Not on file    Current Outpatient Medications:  . B Complex Vitamins (VITAMIN B COMPLEX PO), Take 1 tablet by mouth daily., Disp: , Rfl:  . fluticasone (FLONASE) 50 MCG/ACT nasal spray, Place 2 sprays into both nostrils daily., Disp: 16 g, Rfl: 0  - Noted not taking today .  lisinopril-hydrochlorothiazide (ZESTORETIC) 20-25 MG tablet, Take 1 tablet by mouth daily., Disp: 90 tablet, Rfl: 3  . loratadine (CLARITIN) 10 MG tablet, Take 1 tablet (10 mg total) by mouth daily., Disp: 90 tablet, Rfl: 3  . Omega-3 Fatty Acids (FISH OIL) 1200 MG CAPS, Take 1 capsule by mouth 2 (two) times daily., Disp: , Rfl:  . tadalafil (CIALIS) 5 MG tablet, Take 1 tablet (5 mg total) by mouth daily as needed for erectile dysfunction., Disp: 90 tablet, Rfl: 1   All - No known allergies  ROS: As noted above in HPI  Constitutional: Negative for fever or significant weight change.  Respiratory: Negative for cough and shortness of breath.  Cardiovascular: Negative for chest pain or palpitations.  Gastrointestinal: Negative for abdominal pain, no bowel changes.  Musculoskeletal: Negative for gait problem or joint swelling. Does note occas knee discomfort, mainly after increase activities  Skin: Negative for rash.  Neurological: Negative for increased headache. No vision changes No other specific complaints in a complete review of systems (except as listed in HPI above).    Objective   BP 124/70   Pulse 71   Temp 97.7 F (36.5 C) (Temporal)   Resp 16   Ht 5\' 10"  (1.778 m)   Wt 258 lb 1.6 oz (117.1 kg)   SpO2 96%   BMI 37.03 kg/m    NAD, masked, very pleasant HEENT - sclera anicteric, + glasses, PERRL, EOMI, conj - non-inj'ed, TM's and canals clear, pharynx clear  Neck - supple, no adenopathy, no TM, no JVD, carotids 2+ and =   Car - RRR without m/g/r  Pulm- CTA without wheeze or rales  Abd - soft, obese,  NT, ND, BS+, no obvious HSM, no masses, + diastasis recti  Back - no CVA tenderness, no spinal tenderness  Skin- he denied new lesions of concern, scattered moles - back with no major concerns   Ext - trace LE edema bilat, no active joints, no calf tenderness bilat GU - no swelling in inguinal/suprapubic region, NT,  Normal descended testes bilaterally, no masses palpated, no  hernias, no lesions, no discharge  Prostate - no tenderness, fluctuance, nodules on palpation, mildly increased in size, 1+ Neuro - affect was not flat, appropriate with conversation  Grossly non-focal with good strength on testing, sensation intact to LT in distal extremities, DTR's 2+ and = patella, Romberg neg, no pronator drift, good balance on one foot, good finger to nose, good tandem walk  Fall Risk:  Fall Risk  03/18/2019 05/14/2018 01/04/2018 05/30/2017 01/09/2017  Falls in the past year? 0 0 No No No  Number  falls in past yr: 0 - - - -  Injury with Fall? 0 - - - -   Functional Status Survey: All neg  Assessment & Plan  1. General Health/Health Maintenance:  -Prostate cancer screening and PSA options (with potential risks and benefits of testing vs not testing) were discussed along with recent recs/guidelines. Have been following PSA's yearly. PSA ordered.  -USPSTF grade A and B recommendations reviewed with patient; age-appropriate recommendations, preventive care, screening tests, etc discussed and encouraged; healthy living encouraged; see AVS for any further patient education given to patient  -Discussed importance of regular physical activity weekly,  -Discussed importance of eating healthy diet:   -Reviewed Health Maintenance:      Health Maintenance  Topic Date Due  . INFLUENZA VACCINE  01/15/2019  . PNA vac Low Risk Adult (1 of 2 - PCV13) 07/28/2019  . TETANUS/TDAP  05/31/2022  . COLONOSCOPY  08/14/2025  . Hepatitis C Screening  Completed  . HIV Screening  Completed   Inf vaccine - had in Oct 2020 Pneumocccal vaccine rec'ed - PPSV23  Discussed shingrix - he wanted to wait presently, may be next year noted Next colonoscopy due in 08/2020   1. Encounter for abdominal aortic aneurysm (AAA) screening  - US AORTA DUPLEX LIMITED; Future  2. Screening for prostate cancer  - PSA  3. Screening for diabetes mellitus  - Hemoglobin A1c  4. Prediabetes  - Hemoglobin  A1c Will check A1C if drawing blood for PSA to assess  5. Benign essential HTN BP today good. Cont meds  6. Chronic seasonal allergic rhinitis due to pollen Cont loratidine  7. Mixed hyperlipidemia Cont statin, and follow-up for rechecks  8. Erectile dysfunction of organic origin Has a lot of cialis presently and can use prn  9. History of elevated PSA - PSA ordered and following.   10. Sleep apnea Cont the CPAP  11. Diastasis recti Cont with conservative management

## 2019-08-13 LAB — HEMOGLOBIN A1C
Hgb A1c MFr Bld: 6.3 % of total Hgb — ABNORMAL HIGH (ref <5.7)
Mean Plasma Glucose: 134 (calc)
eAG (mmol/L): 7.4 (calc)

## 2019-08-13 LAB — PSA: PSA: 2.5 ng/mL (ref <=4.0)

## 2019-08-13 NOTE — Progress Notes (Signed)
Eric English,  It was nice meeting you yesterday. The labs obtained were reviewed and showed that your PSA remains good at 2.5. The A1C was about the same at 6.3 (was 6.1 in October 2020, and 6.3 in February 2020). This remains consistent with prediabetes. I added some diet recommendations below to try to help prevent this from progressing to diabetes and the need for medicines to help control.   Stay safe, Dr. Roxan Hockey   A diet for diabetes and prediabetes is simply eating the healthiest foods in moderate amounts and sticking to regular mealtimes. Key elements are boosting intake of vegetables and fresh fruit, eating more low-fat dairy products and beans, choosing whole grains more often, and choosing fish, poultry and lean meats instead of fatty red meat or processed meat. Avoid less healthy carbohydrates, such as foods or drinks with added fats, sugars and sodium. Also avoid foods high in saturated fats and trans fats found in processed snacks and many baked goods. And in addition to helping manage diabetes, this diet can also help lower your risk of cardiovascular diseases and certain types of cancer. And with appropriate moderation of intake, can help with weight management goals.

## 2019-08-29 ENCOUNTER — Encounter: Payer: Self-pay | Admitting: Internal Medicine

## 2019-08-29 ENCOUNTER — Other Ambulatory Visit: Payer: Self-pay

## 2019-08-29 MED ORDER — ATORVASTATIN CALCIUM 40 MG PO TABS: 40.0000 mg | ORAL_TABLET | Freq: Every day | ORAL | 3 refills | Status: DC

## 2019-11-01 ENCOUNTER — Encounter: Payer: Self-pay | Admitting: Internal Medicine

## 2019-11-02 ENCOUNTER — Encounter: Payer: Self-pay | Admitting: Internal Medicine

## 2019-11-02 ENCOUNTER — Other Ambulatory Visit: Payer: Self-pay

## 2019-11-02 DIAGNOSIS — G4733 Obstructive sleep apnea (adult) (pediatric): Secondary | ICD-10-CM

## 2019-11-25 ENCOUNTER — Encounter (INDEPENDENT_AMBULATORY_CARE_PROVIDER_SITE_OTHER): Payer: Self-pay

## 2019-12-13 ENCOUNTER — Telehealth: Payer: Self-pay

## 2019-12-13 NOTE — Telephone Encounter (Signed)
Copied from [place] [id number]. Topic: General - Inquiry >> Dec 09, 2019 12:14 PM Greggory Keen D wrote: Reason for CRM: Pt called saying he was supposed to have asleep study tonight and the sleep study called him and told him his insurance denied payment for him to have one because there was not a medical necessity.  CB#  [phone]

## 2019-12-14 NOTE — Telephone Encounter (Signed)
Referral re submitted to sleep center in [place], [place]. Insurance preferred.

## 2020-02-13 ENCOUNTER — Encounter: Payer: Self-pay | Admitting: Internal Medicine

## 2020-02-23 NOTE — Progress Notes (Signed)
Patient ID: Eric Maxon., male    DOB: 1956/05/26, 64 y.o.   MRN: [id number]  PCP: Towanda Malkin, MD  Chief Complaint  Patient presents with  . Dizziness    when he bends over and stands up he gets very dizzy  . Shortness of Breath    when he exercises he feels he gets short of breath    Subjective:   Eric Knaak. is a 64 y.o. male, presents to clinic with CC of the following:  Chief Complaint  Patient presents with  . Dizziness    when he bends over and stands up he gets very dizzy  . Shortness of Breath    when he exercises he feels he gets short of breath    HPI:  Patient is a 64 year old male Last visit with me was in February, 2021 for an annual CPE Communication with labs after that visit was as follows:  The labs obtained were reviewed and showed that your PSA remains good at 2.5. The A1C was about the same at 6.3 (was 6.1 in October 2020, and 6.3 in February 2020). This remains consistent with prediabetes.  He sent a message with the following:  Might need to get the ticker checked out,  Challenges:  1. Bending over stand up, getting a little light-headed, 2. Ankle swelling throughout the day.   3. I do work at [employer] a lot, right arm/ hand gets a little tingly  4. outside working sweat a lot and get exhausted very quickly. Current weight 257lbs Height 70" 02/13/20 3:15PM BP140/67  Recommended a follow-up appointment,  was scheduled today.  Pt is living/working in [place] during the week and comes home to this area on the weekends. At a desk all day at work.   He notes in the recent past he has resumed more exercise, and when doing so, he thinks he gets a little more short of breath now than in the past, especially when first starting his exercise regimen.  This includes jumping rope, doing some activities with a wheeled type device, and then walking.  He denies any chest pains, no palpitations.  He notes his leg swelling that  often accumulates during the day has lessened some since more exercise.  He also notes that when he is working bent over for prolonged period of time, when he gets up, especially when he gets up quickly, he feels lightheaded/dizzy.  He notes this does not happen very frequently, but thinks more noticeable in the recent past.  He does drink 3 cups of coffee in the morning, and then some fluids later in the day, tries to avoid sodas.  He noted his right arm can get tingly at times, mainly after sitting and typing for a while, and then takes a break and does some movements and seems to improve.  Also after sleeping on that arm, and can fall asleep, likely from the compression.  No chest pains or pains down the left arm noted.  And no pains down the right arm.  He notes he has his blood pressures checked at work, and they have been in the borderline category, not high, although he could not remember the numbers to share with me today.  He states he keeps a chart at work, and he is often in the yellow bordering on the green.  Problems recently managed include:   HTN - Medication regimen-Zestoretic Take the medication regularly Keeps track of  his blood pressures at work as noted above. BP Readings from Last 3 Encounters:  02/27/20 110/60  08/12/19 124/70  07/30/18 (!) 150/60    Denies chest pain, short palpitations, shortness of breath at rest with some increased shortness of breath with exertion recently, notes some lightheadedness when he bends over and stands up as above noted, also some increased ankle swelling throughout the day but notes has been better since exercising more, denies marked headaches or vision changes Notes when outside working, he sweats a lot and often gets exhausted more quickly.  The weather has been very hot here recently.  HLD -  Medication regimen- Lipitor 40mg  and Omega 3 capsule at times Taking Lipitor medication regularly Lab Results  Component Value Date   CHOL  153 03/19/2019   HDL 37 (L) 03/19/2019   LDLCALC 91 03/19/2019   TRIG 124 03/19/2019   CHOLHDL 4.1 03/19/2019   Denies diffuse myalgias  Prediabetes  Lab Results  Component Value Date   HGBA1C 6.3 (H) 08/12/2019   HGBA1C 6.1 (H) 03/19/2019   HGBA1C 6.3 (H) 07/30/2018   Lab Results  Component Value Date   LDLCALC 91 03/19/2019   CREATININE 0.84 03/19/2019     Elevated PSA/BPH- had seen [facility] in 2016, he was advised to follow up in one year, but lost to follow up. Had biopsies in past and all ok.   Denies  nocturia, hesitancy, urgency and not taking cialis daily as felt not make a huge difference, still not use prn much either Lab Results  Component Value Date   PSA1 3.6 12/25/2014   PSA 2.5 08/12/2019   PSA 2.8 07/30/2018    Allergic rhinitis- uses Flonase/loratadine when needed for seasonal symptoms  Uses CPAP for sleep apnea  Obesity Diet: notes occasionally tries to watch and eat healthy when can, not adherent to a very strict specific diet, and notes she does eat fatty foods at times Exercise: doing jump rope, a lot of walking and gets out of breath when starts exercising, still not going to gym since Covid and notes may be out of shape as a result No significant weight change since February  Wt Readings from Last 3 Encounters:  02/27/20 257 lb 9.6 oz (116.8 kg)  08/12/19 258 lb 1.6 oz (117.1 kg)  03/18/19 252 lb (114.3 kg)    No regular alcohol use Tob - quit in 1999    Patient Active Problem List   Diagnosis Date Noted  . Class 2 severe obesity due to excess calories with serious comorbidity and body mass index (BMI) of 37.0 to 37.9 in adult ([place]) 02/27/2020  . History of elevated PSA 08/12/2019  . Obstructive sleep apnea syndrome 08/12/2019  . Mixed hyperlipidemia 03/18/2019  . Prediabetes 08/02/2018  . Chronic left shoulder pain 01/04/2018  . Chronic seasonal allergic rhinitis due to pollen 09/19/2016  . Hemorrhoids 07/15/2016  . Erectile  dysfunction of organic origin 10/08/2015  . Elevated liver enzymes 10/08/2015  . Screening for gout 07/11/2015  . Superficial thrombophlebitis 06/23/2015  . Diastasis recti 12/27/2013  . Benign essential HTN 03/15/2010      Current Outpatient Medications:  .  atorvastatin (LIPITOR) 40 MG tablet, Take 1 tablet (40 mg total) by mouth daily., Disp: 90 tablet, Rfl: 3 .  B Complex Vitamins (VITAMIN B COMPLEX PO), Take 1 tablet by mouth daily., Disp: , Rfl:  .  fluticasone (FLONASE) 50 MCG/ACT nasal spray, Place 2 sprays into both nostrils daily., Disp: 16 g, Rfl: 0 .  lisinopril-hydrochlorothiazide (ZESTORETIC) 20-25 MG tablet, Take 1 tablet by mouth daily., Disp: 90 tablet, Rfl: 3 .  loratadine (CLARITIN) 10 MG tablet, Take 1 tablet (10 mg total) by mouth daily., Disp: 90 tablet, Rfl: 3 .  Omega-3 Fatty Acids (FISH OIL) 1200 MG CAPS, Take 1 capsule by mouth 2 (two) times daily., Disp: , Rfl:  .  tadalafil (CIALIS) 5 MG tablet, Take 1 tablet (5 mg total) by mouth daily as needed for erectile dysfunction., Disp: 90 tablet, Rfl: 1   No Known Allergies   Past Surgical History:  Procedure Laterality Date  . COLONOSCOPY  2006  . COLONOSCOPY WITH PROPOFOL N/A 08/15/2015   Procedure: COLONOSCOPY WITH PROPOFOL;  Surgeon: Robert Bellow, MD;  Location: [facility] ENDOSCOPY;  Service: Endoscopy;  Laterality: N/A;  . HERNIA REPAIR  2694   umbilical  . VASECTOMY  8546     Family History  Problem Relation Age of Onset  . Diabetes Mother   . Hypertension Mother   . CAD Mother   . Hyperlipidemia Mother   . Heart disease Father   . CAD Father   . Diabetes Father      Social History   Tobacco Use  . Smoking status: Former Smoker    Packs/day: 1.00    Years: 24.00    Pack years: 24.00    Types: Cigarettes  . Smokeless tobacco: Never Used  Substance Use Topics  . Alcohol use: Yes    Alcohol/week: 0.0 standard drinks    Comment: Occasionally    With staff assistance, above reviewed  with the patient today.  ROS: As per HPI, denied any dark or black stools, no bleeding per rectum.  No history of thyroid disease.  Otherwise no specific complaints on a limited and focused system review   No results found for this or any previous visit (from the past 72 hour(s)).   PHQ2/9: Depression screen [facility] 2/9 02/27/2020 08/12/2019 03/18/2019 07/30/2018 05/14/2018  Decreased Interest 0 0 0 0 0  Down, Depressed, Hopeless 0 0 0 0 0  PHQ - 2 Score 0 0 0 0 0  Altered sleeping - 0 0 - 0  Tired, decreased energy - 1 0 - 0  Change in appetite - 0 0 - 0  Feeling bad or failure about yourself  - 0 0 - 0  Trouble concentrating - 0 0 - 0  Moving slowly or fidgety/restless - 0 0 - 0  Suicidal thoughts - 0 0 - 0  PHQ-9 Score - 1 0 - 0  Difficult doing work/chores - Not difficult at all Not difficult at all - Not difficult at all   PHQ-2/9 Result is neg  Fall Risk: Fall Risk  02/27/2020 08/12/2019 03/18/2019 05/14/2018 01/04/2018  Falls in the past year? 0 0 0 0 No  Number falls in past yr: 0 0 0 - -  Injury with Fall? 0 0 0 - -  Follow up Falls evaluation completed - - - -      Objective:   Vitals:   02/27/20 0738  BP: 110/60  Pulse: 77  Resp: 16  Temp: 97.9 F (36.6 C)  TempSrc: Oral  SpO2: 99%  Weight: 257 lb 9.6 oz (116.8 kg)  Height: 5\' 10"  (1.778 m)    Body mass index is 36.96 kg/m.  Physical Exam   NAD, masked, very pleasant HEENT - sclera anicteric, + glasses, PERRL, EOMI, conj - non-inj'ed, TM's and canals clear, pharynx clear  Neck - supple, no adenopathy, no  TM, no JVD, carotids 2+ and =   Car - RRR without m/g/r  Pulm- CTA without wheeze or rales  Abd - soft, obese,  NT, ND, BS+, no obvious HSM, no masses, + diastasis recti  Back - no CVA tenderness, no spinal tenderness  Skin- he denied new lesions of concern, scattered moles - back with no major concerns   Ext - trace LE edema bilat, no active joints, no calf tenderness bilat GU - no swelling in  inguinal/suprapubic region, NT,  Normal descended testes bilaterally, no masses palpated, no hernias, no lesions, no discharge  Prostate - no tenderness, fluctuance, nodules on palpation, mildly increased in size, 1+ Neuro - affect was not flat, appropriate with conversation  Grossly non-focal with good strength on testing, sensation intact to LT in distal extremities, DTR's 2+ and = patella, Romberg neg, no pronator drift, good balance on one foot, good finger to nose, good tandem walk  Results for orders placed or performed in visit on 08/12/19  PSA  Result Value Ref Range   PSA 2.5 < OR = 4.0 ng/mL  Hemoglobin A1c  Result Value Ref Range   Hgb A1c MFr Bld 6.3 (H) <5.7 % of total Hgb   Mean Plasma Glucose 134 (calc)   eAG (mmol/L) 7.4 (calc)   ECG-sinus bradycardia with a heart rate approximately 53, more pronounced Q-wave in lead III with a subtle Q-wave in lead aVF, and possible inferior infarct noted.  Discussed with patient these results.    Assessment & Plan:   1. Mixed hyperlipidemia On a statin presently, notes the fish oil is used intermittently. We will recheck a lipid panel today.  - COMPLETE METABOLIC PANEL WITH GFR - Lipid panel - Ambulatory referral to Cardiology  2. Benign essential HTN Blood pressure has been reasonably well controlled, and discussed potential orthostatic symptoms related to his blood pressure medication.  Also noted the importance of staying well-hydrated, with coffee not counting as hydration, and increasing fluids important when consuming caffeine.  He still has some lower extremity edema, especially as the day progresses, although thinks has improved some with his increased exercise recently. Felt best to hold off on changing his blood pressure medication and continue to monitor, especially as they have been borderline readings at work noted. Assess his response with increased hydration noted - COMPLETE METABOLIC PANEL WITH GFR - Ambulatory  referral to Cardiology  3. Prediabetes We will check an A1c again today as well as other labs. - COMPLETE METABOLIC PANEL WITH GFR - Hemoglobin A1c - Microalbumin / creatinine urine ratio - Ambulatory referral to Cardiology  4. Obstructive sleep apnea syndrome Is on CPAP.  5. Fatigue, unspecified type Notes he gets more fatigued with physical activities than he had in the past, although question if it is related to him being less active for a while with not going to the gym, and then trying to resume to level as he had been at previously.  (Deconditioned).  We will check some labs today, including a TSH and a blood count to ensure not anemic. - COMPLETE METABOLIC PANEL WITH GFR - CBC with Differential/Platelet - TSH  6. Class 2 severe obesity due to excess calories with serious comorbidity and body mass index (BMI) of 37.0 to 37.9 Weight has been stable since February  7. Dyspnea on exertion 8. Orthostatic dizziness 9. Abnormal ECG Noted his dyspnea on exertion could possibly be from being deconditioned and trying to get more active again, and encouraged he has  had no dyspnea at rest, and no chest pain concerns.  Reviewed his cardiac risk profile today.  Does have significant risk issues noted. Also feel his orthostatic symptoms may be related to hydration, especially in this warmer weather, and emphasized that importance today. Reviewed his EKG with him today, with the subtle change in the more pronounced Q waves in lead III and the subtle Q-wave in lead aVF. Discussed options with him, and he very much wanted to have a cardiology referral and possibly pursue a stress study to help further assess. He is aware not to be pushing his exercise regimen presently, with no increases recommended until seen by cardiology, and emphasized if he develops any symptoms in the interim, he needs to stop and follow-up. Also will check some labs today as ordered.  - COMPLETE METABOLIC PANEL WITH  GFR - CBC with Differential/Platelet - TSH - Ambulatory referral to Cardiology  Await lab results presently, cardiology input, and tentatively, schedule a follow-up in 6 months time, sooner as needed pending the above.     Towanda Malkin, MD 02/27/20 7:50 AM

## 2020-02-27 ENCOUNTER — Encounter: Payer: Self-pay | Admitting: Internal Medicine

## 2020-02-27 ENCOUNTER — Ambulatory Visit: Payer: Self-pay | Admitting: Internal Medicine

## 2020-02-27 ENCOUNTER — Other Ambulatory Visit: Payer: Self-pay

## 2020-02-27 ENCOUNTER — Ambulatory Visit (INDEPENDENT_AMBULATORY_CARE_PROVIDER_SITE_OTHER): Payer: No Typology Code available for payment source | Admitting: Internal Medicine

## 2020-02-27 VITALS — BP 110/60 | HR 77 | Temp 97.9°F | Resp 16 | Ht 70.0 in | Wt 257.6 lb

## 2020-02-27 DIAGNOSIS — R0609 Other forms of dyspnea: Secondary | ICD-10-CM

## 2020-02-27 DIAGNOSIS — G4733 Obstructive sleep apnea (adult) (pediatric): Secondary | ICD-10-CM

## 2020-02-27 DIAGNOSIS — R42 Dizziness and giddiness: Secondary | ICD-10-CM

## 2020-02-27 DIAGNOSIS — R0600 Dyspnea, unspecified: Secondary | ICD-10-CM

## 2020-02-27 DIAGNOSIS — R9431 Abnormal electrocardiogram [ECG] [EKG]: Secondary | ICD-10-CM | POA: Diagnosis not present

## 2020-02-27 DIAGNOSIS — I10 Essential (primary) hypertension: Secondary | ICD-10-CM | POA: Diagnosis not present

## 2020-02-27 DIAGNOSIS — E782 Mixed hyperlipidemia: Secondary | ICD-10-CM

## 2020-02-27 DIAGNOSIS — Z23 Encounter for immunization: Secondary | ICD-10-CM

## 2020-02-27 DIAGNOSIS — R7303 Prediabetes: Secondary | ICD-10-CM | POA: Diagnosis not present

## 2020-02-27 DIAGNOSIS — R5383 Other fatigue: Secondary | ICD-10-CM

## 2020-02-27 DIAGNOSIS — Z6837 Body mass index (BMI) 37.0-37.9, adult: Secondary | ICD-10-CM

## 2020-02-27 NOTE — Patient Instructions (Addendum)
Referral today was placed for cardiology  Please remember to stay well-hydrated throughout the day.

## 2020-02-28 LAB — TSH: TSH: 1.2 mIU/L (ref 0.40–4.50)

## 2020-02-28 LAB — CBC WITH DIFFERENTIAL/PLATELET
Absolute Monocytes: 695 cells/uL (ref 200–950)
Basophils Absolute: 23 cells/uL (ref 0–200)
Basophils Relative: 0.4 %
Eosinophils Absolute: 63 cells/uL (ref 15–500)
Eosinophils Relative: 1.1 %
HCT: 41.5 % (ref 38.5–50.0)
Hemoglobin: 13.9 g/dL (ref 13.2–17.1)
Lymphs Abs: 1835 cells/uL (ref 850–3900)
MCH: 29.8 pg (ref 27.0–33.0)
MCHC: 33.5 g/dL (ref 32.0–36.0)
MCV: 89.1 fL (ref 80.0–100.0)
MPV: 11.3 fL (ref 7.5–12.5)
Monocytes Relative: 12.2 %
Neutro Abs: 3084 cells/uL (ref 1500–7800)
Neutrophils Relative %: 54.1 %
Platelets: 245 10*3/uL (ref 140–400)
RBC: 4.66 10*6/uL (ref 4.20–5.80)
RDW: 13.1 % (ref 11.0–15.0)
Total Lymphocyte: 32.2 %
WBC: 5.7 10*3/uL (ref 3.8–10.8)

## 2020-02-28 LAB — COMPLETE METABOLIC PANEL WITH GFR
AG Ratio: 1.2 (calc) (ref 1.0–2.5)
ALT: 37 U/L (ref 9–46)
AST: 29 U/L (ref 10–35)
Albumin: 4.3 g/dL (ref 3.6–5.1)
Alkaline phosphatase (APISO): 55 U/L (ref 35–144)
BUN: 17 mg/dL (ref 7–25)
CO2: 33 mmol/L — ABNORMAL HIGH (ref 20–32)
Calcium: 10 mg/dL (ref 8.6–10.3)
Chloride: 101 mmol/L (ref 98–110)
Creat: 0.97 mg/dL (ref 0.70–1.25)
GFR, Est African American: 95 mL/min/{1.73_m2} (ref >=60)
GFR, Est Non African American: 82 mL/min/{1.73_m2} (ref >=60)
Globulin: 3.5 g/dL (calc) (ref 1.9–3.7)
Glucose, Bld: 96 mg/dL (ref 65–139)
Potassium: 3.7 mmol/L (ref 3.5–5.3)
Sodium: 139 mmol/L (ref 135–146)
Total Bilirubin: 0.7 mg/dL (ref 0.2–1.2)
Total Protein: 7.8 g/dL (ref 6.1–8.1)

## 2020-02-28 LAB — LIPID PANEL
Cholesterol: 82 mg/dL (ref <200)
HDL: 33 mg/dL — ABNORMAL LOW (ref >=40)
LDL Cholesterol (Calc): 35 mg/dL (calc)
Non-HDL Cholesterol (Calc): 49 mg/dL (calc) (ref <130)
Total CHOL/HDL Ratio: 2.5 (calc) (ref <5.0)
Triglycerides: 62 mg/dL (ref <150)

## 2020-02-28 LAB — HEMOGLOBIN A1C
Hgb A1c MFr Bld: 6.2 % of total Hgb — ABNORMAL HIGH (ref <5.7)
Mean Plasma Glucose: 131 (calc)
eAG (mmol/L): 7.3 (calc)

## 2020-02-28 LAB — MICROALBUMIN / CREATININE URINE RATIO
Creatinine, Urine: 164 mg/dL (ref 20–320)
Microalb Creat Ratio: 9 mcg/mg creat (ref <30)
Microalb, Ur: 1.5 mg/dL

## 2020-03-12 ENCOUNTER — Ambulatory Visit: Payer: Self-pay | Admitting: Internal Medicine

## 2020-03-23 ENCOUNTER — Other Ambulatory Visit: Payer: Self-pay

## 2020-03-23 ENCOUNTER — Encounter: Payer: Self-pay | Admitting: Cardiology

## 2020-03-23 ENCOUNTER — Ambulatory Visit (INDEPENDENT_AMBULATORY_CARE_PROVIDER_SITE_OTHER): Payer: PRIVATE HEALTH INSURANCE | Admitting: Cardiology

## 2020-03-23 VITALS — BP 140/74 | HR 60 | Ht 70.0 in | Wt 262.0 lb

## 2020-03-23 DIAGNOSIS — I10 Essential (primary) hypertension: Secondary | ICD-10-CM | POA: Diagnosis not present

## 2020-03-23 DIAGNOSIS — R0609 Other forms of dyspnea: Secondary | ICD-10-CM

## 2020-03-23 DIAGNOSIS — Z01812 Encounter for preprocedural laboratory examination: Secondary | ICD-10-CM

## 2020-03-23 DIAGNOSIS — E7800 Pure hypercholesterolemia, unspecified: Secondary | ICD-10-CM

## 2020-03-23 DIAGNOSIS — R0600 Dyspnea, unspecified: Secondary | ICD-10-CM

## 2020-03-23 DIAGNOSIS — I208 Other forms of angina pectoris: Secondary | ICD-10-CM

## 2020-03-23 MED ORDER — METOPROLOL TARTRATE 100 MG PO TABS: 100.0000 mg | ORAL_TABLET | Freq: Once | ORAL | 0 refills | Status: DC

## 2020-03-23 NOTE — Patient Instructions (Signed)
Medication Instructions:   See CTA pre procedure instructions.  *If you need a refill on your cardiac medications before your next appointment, please call your pharmacy*   Lab Work:  Your physician recommends that you return for lab work in: within 2 weeks of your CTA apt as soon as you get it scheduled.   Testing/Procedures:  1)  Your physician has requested that you have an echocardiogram. Echocardiography is a painless test that uses sound waves to create images of your heart. It provides your doctor with information about the size and shape of your heart and how well your heart's chambers and valves are working. This procedure takes approximately one hour. There are no restrictions for this procedure.   2) Your physician has requested that you have cardiac CT. Cardiac computed tomography (CT) is a painless test that uses an x-ray machine to take clear, detailed pictures of your heart.    Your cardiac CT will be scheduled at:  [facility] [address] [place], [place] [address] [phone]  Please arrive 15 mins early for check-in and test prep.   Please follow these instructions carefully (unless otherwise directed):  Hold all erectile dysfunction medications at least 3 days (72 hrs) prior to test   On the Night Before the Test: . Be sure to Drink plenty of water. . Do not consume any caffeinated/decaffeinated beverages or chocolate 12 hours prior to your test. . Do not take any antihistamines 12 hours prior to your test. . If the patient has contrast allergy: ? Patient will need a prescription for Prednisone and very clear instructions (as follows): 1. Prednisone 50 mg - take 13 hours prior to test 2. Take another Prednisone 50 mg 7 hours prior to test 3. Take another Prednisone 50 mg 1 hour prior to test 4. Take Benadryl 50 mg 1 hour prior to test . Patient must complete all four doses of above prophylactic  medications. . Patient will need a ride after test due to Benadryl.  On the Day of the Test: . Drink plenty of water. Do not drink any water within one hour of the test. . Do not eat any food 4 hours prior to the test. . You may take your regular medications prior to the test.  . TAKE metoprolol (Lopressor) two hours prior to test.  HOLD lisinopril-hydrochlorothiazide (ZESTORETIC) 20-25 MG tablet the morning of your test  HOLD your tadalafil (CIALIS) 5 MG tablet FOR 72 HOURS prior to test.        After the Test: . Drink plenty of water. . After receiving IV contrast, you may experience a mild flushed feeling. This is normal. . On occasion, you may experience a mild rash up to 24 hours after the test. This is not dangerous. If this occurs, you can take Benadryl 25 mg and increase your fluid intake. . If you experience trouble breathing, this can be serious. If it is severe call 911 IMMEDIATELY. If it is mild, please call our office. . If you take any of these medications: Glipizide/Metformin, Avandament, Glucavance, please do not take 48 hours after completing test unless otherwise instructed.   Once we have confirmed authorization from your insurance company, we will call you to set up a date and time for your test. Based on how quickly your insurance processes prior authorizations requests, please allow up to 4 weeks to be contacted for scheduling your Cardiac CT appointment. Be advised that routine Cardiac CT appointments could  be scheduled as many as 8 weeks after your provider has ordered it.  For non-scheduling related questions, please contact the cardiac imaging nurse navigator should you have any questions/concerns: Marchia Bond, Cardiac Imaging Nurse Navigator Burley Saver, Interim Cardiac Imaging Nurse [facility] Direct Office Dial: [phone]   For scheduling needs, including cancellations and rescheduling, please call Vivien Rota at [phone],  option 3.     Follow-Up: At [facility], you and your health needs are our priority.  As part of our continuing mission to provide you with exceptional heart care, we have created designated Provider Care Teams.  These Care Teams include your primary Cardiologist (physician) and Advanced Practice Providers (APPs -  Physician Assistants and Nurse Practitioners) who all work together to provide you with the care you need, when you need it.  We recommend signing up for the patient portal called "MyChart".  Sign up information is provided on this After Visit Summary.  MyChart is used to connect with patients for Virtual Visits (Telemedicine).  Patients are able to view lab/test results, encounter notes, upcoming appointments, etc.  Non-urgent messages can be sent to your provider as well.   To learn more about what you can do with MyChart, go to [email or web address].    Your next appointment:   Follow up after Echo and Myoview   The format for your next appointment:   In Person  Provider:   Kate Sable, MD   Other Instructions

## 2020-03-23 NOTE — Progress Notes (Signed)
Cardiology Office Note:    Date:  03/23/2020   ID:  Eric Maxon., DOB 17-Mar-1956, MRN [id number]  PCP:  Towanda Malkin, MD  [place] Cardiologist:  No primary care provider on file.  [facility] Electrophysiologist:  None   Referring MD: Lebron Conners D*   Chief Complaint  Patient presents with  . New Patient (Initial Visit)    Establish care with provider. Medications verbally reviewed with patient.    Eric Furuya. is a 64 y.o. male who is being seen today for the evaluation of dyspnea on exertion at the request of Towanda Malkin*.   History of Present Illness:    Eric Hollenbach. is a 64 y.o. male with a hx of hypertension, hyperlipidemia, former smoker x25 years who presents due to dyspnea on exertion. Patient states having shortness of breath with usual activity over the past several weeks. He exercises about 5 times a week, denies chest pain, but has noticed more out of breath with his usual activity which he attributes to age. He likes to engage in sporting activities but due to his age, physical/medical exam is usually recommended. He is also worried since his sister recently passed from a heart condition. His dad passed from heart failure in his 1s. Patient currently denies edema, overall feels okay. Takes all medications as prescribed.  Past Medical History:  Diagnosis Date  . Abnormal glucose   . Hemorrhoids   . Hypertension   . Prostate enlargement   . Sleep apnea   . Trigger finger     Past Surgical History:  Procedure Laterality Date  . COLONOSCOPY  2006  . COLONOSCOPY WITH PROPOFOL N/A 08/15/2015   Procedure: COLONOSCOPY WITH PROPOFOL;  Surgeon: Robert Bellow, MD;  Location: [facility] ENDOSCOPY;  Service: Endoscopy;  Laterality: N/A;  . HERNIA REPAIR  7846   umbilical  . VASECTOMY  9629    Current Medications: Current Meds  Medication Sig  . atorvastatin (LIPITOR) 40 MG tablet Take 1 tablet (40 mg total) by  mouth daily.  . B Complex Vitamins (VITAMIN B COMPLEX PO) Take 1 tablet by mouth daily.  . fluticasone (FLONASE) 50 MCG/ACT nasal spray Place 2 sprays into both nostrils daily.  Marland Kitchen lisinopril-hydrochlorothiazide (ZESTORETIC) 20-25 MG tablet Take 1 tablet by mouth daily.  Marland Kitchen loratadine (CLARITIN) 10 MG tablet Take 1 tablet (10 mg total) by mouth daily.  . Omega-3 Fatty Acids (FISH OIL) 1200 MG CAPS Take 1 capsule by mouth 2 (two) times daily.  . tadalafil (CIALIS) 5 MG tablet Take 1 tablet (5 mg total) by mouth daily as needed for erectile dysfunction.     Allergies:   Patient has no known allergies.   Social History   Socioeconomic History  . Marital status: Married    Spouse name: Junious Dresser   . Number of children: 3  . Years of education: Not on file  . Highest education level: Bachelor's degree (e.g., BA, AB, BS)  Occupational History    Employer: [employer]  Tobacco Use  . Smoking status: Former Smoker    Packs/day: 1.00    Years: 24.00    Pack years: 24.00    Types: Cigarettes  . Smokeless tobacco: Never Used  Substance and Sexual Activity  . Alcohol use: Yes    Alcohol/week: 0.0 standard drinks    Comment: Occasionally  . Drug use: No  . Sexual activity: Not on file  Other Topics Concern  . Not on file  Social History Narrative   Married, wife and children live in [place], he works in [place] , retired from [employer] and also from [employer] Strain:   . Difficulty of Paying Living Expenses: Not on file  Food Insecurity:   . Worried About Charity fundraiser in the Last Year: Not on file  . Ran Out of Food in the Last Year: Not on file  Transportation Needs:   . Lack of Transportation (Medical): Not on file  . Lack of Transportation (Non-Medical): Not on file  Physical Activity:   . Days of Exercise per Week: Not on file  . Minutes of Exercise per Session: Not on file  Stress:   . Feeling of Stress : Not  on file  Social Connections:   . Frequency of Communication with Friends and Family: Not on file  . Frequency of Social Gatherings with Friends and Family: Not on file  . Attends Religious Services: Not on file  . Active Member of Clubs or Organizations: Not on file  . Attends Archivist Meetings: Not on file  . Marital Status: Not on file     Family History: The patient's family history includes CAD in his father and mother; Diabetes in his father and mother; Heart disease in his father; Hyperlipidemia in his mother; Hypertension in his mother.  ROS:   Please see the history of present illness.     All other systems reviewed and are negative.  EKGs/Labs/Other Studies Reviewed:    The following studies were reviewed today:   EKG:  EKG is  ordered today.  The ekg ordered today demonstrates normal sinus rhythm,  Recent Labs: 02/27/2020: ALT 37; BUN 17; Creat 0.97; Hemoglobin 13.9; Platelets 245; Potassium 3.7; Sodium 139; TSH 1.20  Recent Lipid Panel    Component Value Date/Time   CHOL 82 02/27/2020 0834   CHOL 183 12/25/2014 0844   TRIG 62 02/27/2020 0834   HDL 33 (L) 02/27/2020 0834   HDL 41 12/25/2014 0844   CHOLHDL 2.5 02/27/2020 0834   VLDL 25 03/19/2019 0903   LDLCALC 35 02/27/2020 0834     Risk Assessment/Calculations:      Physical Exam:    VS:  BP 140/74 (BP Location: Right Arm, Patient Position: Sitting, Cuff Size: Normal)   Pulse 60   Ht 5' 10"  (1.778 m)   Wt 262 lb (118.8 kg)   SpO2 96%   BMI 37.59 kg/m     Wt Readings from Last 3 Encounters:  03/23/20 262 lb (118.8 kg)  02/27/20 257 lb 9.6 oz (116.8 kg)  08/12/19 258 lb 1.6 oz (117.1 kg)     GEN:  Well nourished, well developed in no acute distress HEENT: Normal NECK: No JVD; No carotid bruits LYMPHATICS: No lymphadenopathy CARDIAC: RRR, no murmurs, rubs, gallops RESPIRATORY:  Clear to auscultation without rales, wheezing or rhonchi  ABDOMEN: Soft, non-tender,  non-distended MUSCULOSKELETAL:  No edema; No deformity  SKIN: Warm and dry NEUROLOGIC:  Alert and oriented x 3 PSYCHIATRIC:  Normal affect   ASSESSMENT:    1. Dyspnea on exertion   2. Primary hypertension   3. Pure hypercholesterolemia   4. Pre-procedure lab exam    PLAN:    In order of problems listed above:  1. Patient with dyspnea on exertion, this could be an anginal equivalent.  Has risk factors of hypertension, hyperlipidemia, age, former smoker.  Get echocardiogram to evaluate systolic and  diastolic function.  Get coronary CTA to evaluate any significant CAD. 2. History of hypertension, BP controlled.  Continue lisinopril/HCTZ. 3. History of hyperlipidemia, cholesterol levels well controlled.  Continue statin as prescribed.  Follow-up after echocardiogram and coronary CTA.   Medication Adjustments/Labs and Tests Ordered: Current medicines are reviewed at length with the patient today.  Concerns regarding medicines are outlined above.  Orders Placed This Encounter  Procedures  . CT CORONARY MORPH W/CTA COR W/SCORE W/CA W/CM &/OR WO/CM  . CT CORONARY FRACTIONAL FLOW RESERVE DATA PREP  . CT CORONARY FRACTIONAL FLOW RESERVE FLUID ANALYSIS  . Basic metabolic panel  . EKG 12-Lead  . ECHOCARDIOGRAM COMPLETE   Meds ordered this encounter  Medications  . metoprolol tartrate (LOPRESSOR) 100 MG tablet    Sig: Take 1 tablet (100 mg total) by mouth once for 1 dose. Take 2 hours prior to your CT scan.    Dispense:  1 tablet    Refill:  0    Patient Instructions  Medication Instructions:   See CTA pre procedure instructions.  *If you need a refill on your cardiac medications before your next appointment, please call your pharmacy*   Lab Work:  Your physician recommends that you return for lab work in: within 2 weeks of your CTA apt as soon as you get it scheduled.   Testing/Procedures:  1)  Your physician has requested that you have an echocardiogram.  Echocardiography is a painless test that uses sound waves to create images of your heart. It provides your doctor with information about the size and shape of your heart and how well your heart's chambers and valves are working. This procedure takes approximately one hour. There are no restrictions for this procedure.   2) Your physician has requested that you have cardiac CT. Cardiac computed tomography (CT) is a painless test that uses an x-ray machine to take clear, detailed pictures of your heart.    Your cardiac CT will be scheduled at:  [facility] [address] [place], [place] [address] [phone]  Please arrive 15 mins early for check-in and test prep.   Please follow these instructions carefully (unless otherwise directed):  Hold all erectile dysfunction medications at least 3 days (72 hrs) prior to test   On the Night Before the Test: . Be sure to Drink plenty of water. . Do not consume any caffeinated/decaffeinated beverages or chocolate 12 hours prior to your test. . Do not take any antihistamines 12 hours prior to your test. . If the patient has contrast allergy: ? Patient will need a prescription for Prednisone and very clear instructions (as follows): 1. Prednisone 50 mg - take 13 hours prior to test 2. Take another Prednisone 50 mg 7 hours prior to test 3. Take another Prednisone 50 mg 1 hour prior to test 4. Take Benadryl 50 mg 1 hour prior to test . Patient must complete all four doses of above prophylactic medications. . Patient will need a ride after test due to Benadryl.  On the Day of the Test: . Drink plenty of water. Do not drink any water within one hour of the test. . Do not eat any food 4 hours prior to the test. . You may take your regular medications prior to the test.  . TAKE metoprolol (Lopressor) two hours prior to test.  HOLD lisinopril-hydrochlorothiazide (ZESTORETIC) 20-25 MG tablet the morning  of your test  HOLD your tadalafil (CIALIS) 5 MG tablet FOR 72  HOURS prior to test.        After the Test: . Drink plenty of water. . After receiving IV contrast, you may experience a mild flushed feeling. This is normal. . On occasion, you may experience a mild rash up to 24 hours after the test. This is not dangerous. If this occurs, you can take Benadryl 25 mg and increase your fluid intake. . If you experience trouble breathing, this can be serious. If it is severe call 911 IMMEDIATELY. If it is mild, please call our office. . If you take any of these medications: Glipizide/Metformin, Avandament, Glucavance, please do not take 48 hours after completing test unless otherwise instructed.   Once we have confirmed authorization from your insurance company, we will call you to set up a date and time for your test. Based on how quickly your insurance processes prior authorizations requests, please allow up to 4 weeks to be contacted for scheduling your Cardiac CT appointment. Be advised that routine Cardiac CT appointments could be scheduled as many as 8 weeks after your provider has ordered it.  For non-scheduling related questions, please contact the cardiac imaging nurse navigator should you have any questions/concerns: Marchia Bond, Cardiac Imaging Nurse Navigator Burley Saver, Interim Cardiac Imaging Nurse [place] [facility] Direct Office Dial: [phone]   For scheduling needs, including cancellations and rescheduling, please call Vivien Rota at [phone], option 3.     Follow-Up: At [facility], you and your health needs are our priority.  As part of our continuing mission to provide you with exceptional heart care, we have created designated Provider Care Teams.  These Care Teams include your primary Cardiologist (physician) and Advanced Practice Providers (APPs -  Physician Assistants and Nurse Practitioners) who all work together to provide you with the  care you need, when you need it.  We recommend signing up for the patient portal called "MyChart".  Sign up information is provided on this After Visit Summary.  MyChart is used to connect with patients for Virtual Visits (Telemedicine).  Patients are able to view lab/test results, encounter notes, upcoming appointments, etc.  Non-urgent messages can be sent to your provider as well.   To learn more about what you can do with MyChart, go to [email or web address].    Your next appointment:   Follow up after Echo and Myoview   The format for your next appointment:   In Person  Provider:   Kate Sable, MD   Other Instructions      Signed, Kate Sable, MD  03/23/2020 5:25 PM    [place]

## 2020-04-12 ENCOUNTER — Telehealth: Payer: Self-pay

## 2020-04-12 NOTE — Telephone Encounter (Signed)
error 

## 2020-04-13 ENCOUNTER — Other Ambulatory Visit: Payer: PRIVATE HEALTH INSURANCE

## 2020-05-04 ENCOUNTER — Other Ambulatory Visit: Payer: PRIVATE HEALTH INSURANCE

## 2020-05-04 ENCOUNTER — Ambulatory Visit: Payer: PRIVATE HEALTH INSURANCE | Admitting: Cardiology

## 2020-05-25 ENCOUNTER — Ambulatory Visit: Payer: PRIVATE HEALTH INSURANCE | Admitting: Cardiology

## 2020-05-29 NOTE — Addendum Note (Signed)
Addended by: Kavin Leech on: 05/29/2020 02:42 PM   Modules accepted: Orders

## 2020-05-29 NOTE — Addendum Note (Signed)
Addended by: Kavin Leech on: 05/29/2020 01:35 PM   Modules accepted: Orders

## 2020-06-06 ENCOUNTER — Telehealth: Payer: Self-pay

## 2020-06-06 ENCOUNTER — Other Ambulatory Visit: Payer: Self-pay

## 2020-06-06 DIAGNOSIS — I10 Essential (primary) hypertension: Secondary | ICD-10-CM

## 2020-06-06 MED ORDER — LISINOPRIL-HYDROCHLOROTHIAZIDE 20-25 MG PO TABS: 1.0000 | ORAL_TABLET | Freq: Every day | ORAL | 1 refills | Status: DC

## 2020-06-06 NOTE — Telephone Encounter (Signed)
Pt is completely out of lisinopril and is requesting a refill to be sent to [facility]. Pt does have a 46m fu appt scheduled

## 2020-07-02 ENCOUNTER — Other Ambulatory Visit: Payer: PRIVATE HEALTH INSURANCE

## 2020-07-09 ENCOUNTER — Other Ambulatory Visit: Payer: PRIVATE HEALTH INSURANCE

## 2020-07-13 ENCOUNTER — Ambulatory Visit: Payer: PRIVATE HEALTH INSURANCE | Admitting: Cardiology

## 2020-07-20 ENCOUNTER — Ambulatory Visit (INDEPENDENT_AMBULATORY_CARE_PROVIDER_SITE_OTHER): Payer: BLUE CROSS/BLUE SHIELD

## 2020-07-20 ENCOUNTER — Other Ambulatory Visit: Payer: Self-pay

## 2020-07-20 DIAGNOSIS — R0609 Other forms of dyspnea: Secondary | ICD-10-CM

## 2020-07-20 DIAGNOSIS — R0600 Dyspnea, unspecified: Secondary | ICD-10-CM

## 2020-07-20 LAB — ECHOCARDIOGRAM COMPLETE
AR max vel: 3.72 cm2
AV Area VTI: 3.88 cm2
AV Area mean vel: 3.34 cm2
AV Mean grad: 5 mmHg
AV Peak grad: 8.6 mmHg
Ao pk vel: 1.47 m/s
Area-P 1/2: 4.12 cm2
Calc EF: 59.7 %
S' Lateral: 2.7 cm
Single Plane A2C EF: 58 %
Single Plane A4C EF: 59.9 %

## 2020-07-23 ENCOUNTER — Telehealth: Payer: Self-pay

## 2020-07-23 NOTE — Telephone Encounter (Signed)
Spoke with patient and gave him his echo result note. Patient stated that he will have to cancel his CTA on 07/26/20 and his follow up with [place] on 07/27/20 as he is in [place] and wont be back by then. I sent a message to [place] to accommodate rescheduling the CTA and cancelled his follow up with [place]. Will reschedule after his CTA is rescheduled.

## 2020-07-24 ENCOUNTER — Telehealth: Payer: Self-pay | Admitting: Cardiology

## 2020-07-24 NOTE — Telephone Encounter (Signed)
  Patient Consent for Virtual Visit         Eric English. has provided verbal consent on 07/24/2020 for a virtual visit (video or telephone).   CONSENT FOR VIRTUAL VISIT FOR:  Eric English.  By participating in this virtual visit I agree to the following:  I hereby voluntarily request, consent and authorize [facility] and its employed or contracted physicians, physician assistants, nurse practitioners or other licensed health care professionals (the Practitioner), to provide me with telemedicine health care services (the "Services") as deemed necessary by the treating Practitioner. I acknowledge and consent to receive the Services by the Practitioner via telemedicine. I understand that the telemedicine visit will involve communicating with the Practitioner through live audiovisual communication technology and the disclosure of certain medical information by electronic transmission. I acknowledge that I have been given the opportunity to request an in-person assessment or other available alternative prior to the telemedicine visit and am voluntarily participating in the telemedicine visit.  I understand that I have the right to withhold or withdraw my consent to the use of telemedicine in the course of my care at any time, without affecting my right to future care or treatment, and that the Practitioner or I may terminate the telemedicine visit at any time. I understand that I have the right to inspect all information obtained and/or recorded in the course of the telemedicine visit and may receive copies of available information for a reasonable fee.  I understand that some of the potential risks of receiving the Services via telemedicine include:  Marland Kitchen Delay or interruption in medical evaluation due to technological equipment failure or disruption; . Information transmitted may not be sufficient (e.g. poor resolution of images) to allow for appropriate medical decision making by the  Practitioner; and/or  . In rare instances, security protocols could fail, causing a breach of personal health information.  Furthermore, I acknowledge that it is my responsibility to provide information about my medical history, conditions and care that is complete and accurate to the best of my ability. I acknowledge that Practitioner's advice, recommendations, and/or decision may be based on factors not within their control, such as incomplete or inaccurate data provided by me or distortions of diagnostic images or specimens that may result from electronic transmissions. I understand that the practice of medicine is not an exact science and that Practitioner makes no warranties or guarantees regarding treatment outcomes. I acknowledge that a copy of this consent can be made available to me via my patient portal ([facility]), or I can request a printed copy by calling the office of [facility].    I understand that my insurance will be billed for this visit.   I have read or had this consent read to me. . I understand the contents of this consent, which adequately explains the benefits and risks of the Services being provided via telemedicine.  . I have been provided ample opportunity to ask questions regarding this consent and the Services and have had my questions answered to my satisfaction. . I give my informed consent for the services to be provided through the use of telemedicine in my medical care

## 2020-07-26 ENCOUNTER — Ambulatory Visit: Admission: RE | Admit: 2020-07-26 | Payer: BLUE CROSS/BLUE SHIELD | Source: Ambulatory Visit

## 2020-07-27 ENCOUNTER — Ambulatory Visit: Payer: PRIVATE HEALTH INSURANCE | Admitting: Cardiology

## 2020-08-02 ENCOUNTER — Telehealth (HOSPITAL_COMMUNITY): Payer: Self-pay | Admitting: Emergency Medicine

## 2020-08-02 NOTE — Telephone Encounter (Signed)
Reaching out to patient to offer assistance regarding upcoming cardiac imaging study; pt verbalizes understanding of appt date/time, parking situation and where to check in, pre-test NPO status and medications ordered, and verified current allergies; name and call back number provided for further questions should they arise Eric Bond RN Navigator Cardiac Imaging [name] [phone] office [phone] cell  Pt instructed to take 50mg  metoprolol tartrate as past ECGs were 53, 60 bpm [name]

## 2020-08-06 ENCOUNTER — Encounter (HOSPITAL_COMMUNITY): Payer: Self-pay

## 2020-08-06 ENCOUNTER — Other Ambulatory Visit: Payer: Self-pay

## 2020-08-06 ENCOUNTER — Ambulatory Visit (HOSPITAL_COMMUNITY)
Admission: RE | Admit: 2020-08-06 | Discharge: 2020-08-06 | Disposition: A | Discharge status: Home / Self Care | Payer: BLUE CROSS/BLUE SHIELD | Source: Ambulatory Visit | Attending: Cardiology | Admitting: Cardiology

## 2020-08-06 ENCOUNTER — Encounter: Payer: BLUE CROSS/BLUE SHIELD | Admitting: *Deleted

## 2020-08-06 DIAGNOSIS — Z006 Encounter for examination for normal comparison and control in clinical research program: Secondary | ICD-10-CM

## 2020-08-06 DIAGNOSIS — I208 Other forms of angina pectoris: Secondary | ICD-10-CM | POA: Diagnosis not present

## 2020-08-06 LAB — POCT I-STAT CREATININE: Creatinine, Ser: 1 mg/dL (ref 0.61–1.24)

## 2020-08-06 MED ORDER — NITROGLYCERIN 0.4 MG SL SUBL
SUBLINGUAL_TABLET | SUBLINGUAL | Status: AC
  Filled 2020-08-06: qty 2

## 2020-08-06 MED ORDER — NITROGLYCERIN 0.4 MG SL SUBL
0.8000 mg | SUBLINGUAL_TABLET | Freq: Once | SUBLINGUAL | Status: AC | PRN
  Administered 2020-08-06: 0.8 mg via SUBLINGUAL

## 2020-08-06 MED ORDER — IOHEXOL 350 MG/ML SOLN
80.0000 mL | Freq: Once | INTRAVENOUS | Status: AC | PRN
  Administered 2020-08-06: 80 mL via INTRAVENOUS

## 2020-08-06 NOTE — Research (Signed)
Subject Name: Eric English.  Subject met inclusion and exclusion criteria.  The informed consent form, study requirements and expectations were reviewed with the subject and questions and concerns were addressed prior to the signing of the consent form.  The subject verbalized understanding of the trial requirements.  The subject agreed to participate in the IDENTIFY trial and signed the informed consent at 0742 on 08/06/20  The informed consent was obtained prior to performance of any protocol-specific procedures for the subject.  A copy of the signed informed consent was given to the subject and a copy was placed in the subject's medical record.   [name]

## 2020-08-10 ENCOUNTER — Telehealth (INDEPENDENT_AMBULATORY_CARE_PROVIDER_SITE_OTHER): Payer: BLUE CROSS/BLUE SHIELD | Admitting: Cardiology

## 2020-08-10 ENCOUNTER — Other Ambulatory Visit: Payer: Self-pay

## 2020-08-10 ENCOUNTER — Telehealth: Payer: BLUE CROSS/BLUE SHIELD | Admitting: Cardiology

## 2020-08-10 ENCOUNTER — Encounter: Payer: Self-pay | Admitting: Cardiology

## 2020-08-10 VITALS — Ht 70.5 in | Wt 260.0 lb

## 2020-08-10 DIAGNOSIS — I10 Essential (primary) hypertension: Secondary | ICD-10-CM

## 2020-08-10 DIAGNOSIS — E7800 Pure hypercholesterolemia, unspecified: Secondary | ICD-10-CM | POA: Diagnosis not present

## 2020-08-10 DIAGNOSIS — R079 Chest pain, unspecified: Secondary | ICD-10-CM | POA: Diagnosis not present

## 2020-08-10 NOTE — Progress Notes (Signed)
Virtual Visit via Video Note   This visit type was conducted due to national recommendations for restrictions regarding the COVID-19 Pandemic (e.g. social distancing) in an effort to limit this patient's exposure and mitigate transmission in our community.  Due to his co-morbid illnesses, this patient is at least at moderate risk for complications without adequate follow up.  This format is felt to be most appropriate for this patient at this time.  All issues noted in this document were discussed and addressed.  A limited physical exam was performed with this format.  Please refer to the patient's chart for his consent to telehealth for [facility].      Date:  08/10/2020   ID:  Eric Maxon., DOB [birth year], MRN [id number]  Patient Location: Home Provider Location: Office/Clinic  PCP:  Towanda Malkin, MD (Inactive)  Cardiologist:  Kate Sable, MD  Electrophysiologist:  None   Evaluation Performed:  Follow-Up Visit  Chief Complaint: Follow-up visit, chest pain  History of Present Illness:    Eric Rodelo. is a 65 y.o. male with hypertension, hyperlipidemia, former smoker x25 years who presents for follow-up.  Patient previously seen due to shortness of breath on exertion.  Symptoms seem to be an anginal equivalent .  Due to risk factors, echocardiogram and coronary CTA was ordered to evaluate presence of cardiac disease. He now attributes his symptoms to anxiety, has felt better since our last visit, denies further episodes of dyspnea on exertion. He takes his medications as prescribed. Does not check his blood pressure often. States having a machine at work which he occasionally uses.  The patient does not have symptoms concerning for COVID-19 infection (fever, chills, cough, or new shortness of breath).    Past Medical History:  Diagnosis Date  . Abnormal glucose   . Hemorrhoids   . Hypertension   . Prostate enlargement   . Sleep apnea   . Trigger  finger    Past Surgical History:  Procedure Laterality Date  . COLONOSCOPY  2006  . COLONOSCOPY WITH PROPOFOL N/A 08/15/2015   Procedure: COLONOSCOPY WITH PROPOFOL;  Surgeon: Robert Bellow, MD;  Location: [facility] ENDOSCOPY;  Service: Endoscopy;  Laterality: N/A;  . HERNIA REPAIR  4163   umbilical  . VASECTOMY  8453     Current Meds  Medication Sig  . atorvastatin (LIPITOR) 40 MG tablet Take 1 tablet (40 mg total) by mouth daily.  . B Complex Vitamins (VITAMIN B COMPLEX PO) Take 1 tablet by mouth daily.  . fluticasone (FLONASE) 50 MCG/ACT nasal spray Place 2 sprays into both nostrils daily.  Marland Kitchen lisinopril-hydrochlorothiazide (ZESTORETIC) 20-25 MG tablet Take 1 tablet by mouth daily.  Marland Kitchen loratadine (CLARITIN) 10 MG tablet Take 1 tablet (10 mg total) by mouth daily.  . Omega-3 Fatty Acids (FISH OIL) 1200 MG CAPS Take 1 capsule by mouth 2 (two) times daily.  . tadalafil (CIALIS) 5 MG tablet Take 1 tablet (5 mg total) by mouth daily as needed for erectile dysfunction.     Allergies:   Patient has no known allergies.   Social History   Tobacco Use  . Smoking status: Former Smoker    Packs/day: 1.00    Years: 24.00    Pack years: 24.00    Types: Cigarettes  . Smokeless tobacco: Never Used  Substance Use Topics  . Alcohol use: Yes    Alcohol/week: 0.0 standard drinks    Comment: Occasionally  . Drug use: No  Family Hx: The patient's family history includes CAD in his father and mother; Diabetes in his father and mother; Heart disease in his father; Hyperlipidemia in his mother; Hypertension in his mother.  ROS:   Please see the history of present illness.     All other systems reviewed and are negative.   Prior CV studies:   The following studies were reviewed today:    Labs/Other Tests and Data Reviewed:    EKG:  No ECG reviewed.  Recent Labs: 02/27/2020: ALT 37; BUN 17; Hemoglobin 13.9; Platelets 245; Potassium 3.7; Sodium 139; TSH 1.20 08/06/2020: Creatinine, Ser  1.00   Recent Lipid Panel Lab Results  Component Value Date/Time   CHOL 82 02/27/2020 08:34 AM   CHOL 183 12/25/2014 08:44 AM   TRIG 62 02/27/2020 08:34 AM   HDL 33 (L) 02/27/2020 08:34 AM   HDL 41 12/25/2014 08:44 AM   CHOLHDL 2.5 02/27/2020 08:34 AM   LDLCALC 35 02/27/2020 08:34 AM    Wt Readings from Last 3 Encounters:  08/10/20 260 lb (117.9 kg)  03/23/20 262 lb (118.8 kg)  02/27/20 257 lb 9.6 oz (116.8 kg)     Objective:    Vital Signs:  Ht 5' 10.5" (1.791 m)   Wt 260 lb (117.9 kg)   BMI 36.78 kg/m    VITAL SIGNS:  reviewed GEN:  no acute distress EYES:  sclerae anicteric, EOMI - Extraocular Movements Intact  ASSESSMENT & PLAN:    1. Chest pain, risk factors hypertension hyperlipidemia, former smoker.  Echocardiogram showed normal systolic function, grade 2 diastolic dysfunction.  Coronary CTA showed a calcium score of 6.1, calcified plaque in the mid LAD causing minimal nonobstructive stenosis.  Patient made aware of results and reassured. 2. Hypertension, .  Continue lisinopril HCTZ. 3. Hyperlipidemia, continue statin.  COVID-19 Education: The signs and symptoms of COVID-19 were discussed with the patient and how to seek care for testing (follow up with PCP or arrange E-visit).  The importance of social distancing was discussed today.  Time:   Today, I have spent 35 minutes with the patient with telehealth technology discussing the above problems.     Medication Adjustments/Labs and Tests Ordered: Current medicines are reviewed at length with the patient today.  Concerns regarding medicines are outlined above.   Tests Ordered: No orders of the defined types were placed in this encounter.   Medication Changes: No orders of the defined types were placed in this encounter.   Follow Up:  In Person in 6 month(s)  Signed, Kate Sable, MD  08/10/2020 12:29 PM    [place]

## 2020-08-10 NOTE — Patient Instructions (Signed)
Medication Instructions:  Your physician recommends that you continue on your current medications as directed. Please refer to the Current Medication list given to you today.  *If you need a refill on your cardiac medications before your next appointment, please call your pharmacy*   Lab Work: None ordered If you have labs (blood work) drawn today and your tests are completely normal, you will receive your results only by: Marland Kitchen MyChart Message (if you have MyChart) OR . A paper copy in the mail If you have any lab test that is abnormal or we need to change your treatment, we will call you to review the results.   Testing/Procedures:  none ordered   Follow-Up: At [facility], you and your health needs are our priority.  As part of our continuing mission to provide you with exceptional heart care, we have created designated Provider Care Teams.  These Care Teams include your primary Cardiologist (physician) and Advanced Practice Providers (APPs -  Physician Assistants and Nurse Practitioners) who all work together to provide you with the care you need, when you need it.  We recommend signing up for the patient portal called "MyChart".  Sign up information is provided on this After Visit Summary.  MyChart is used to connect with patients for Virtual Visits (Telemedicine).  Patients are able to view lab/test results, encounter notes, upcoming appointments, etc.  Non-urgent messages can be sent to your provider as well.   To learn more about what you can do with MyChart, go to [email or web address].    Your next appointment:   6 month(s)  The format for your next appointment:   In Person  Provider:   Kate Sable, MD   Other Instructions

## 2020-08-23 ENCOUNTER — Encounter: Payer: Self-pay | Admitting: Family Medicine

## 2020-08-27 ENCOUNTER — Ambulatory Visit: Payer: No Typology Code available for payment source | Admitting: Family Medicine

## 2020-08-27 ENCOUNTER — Ambulatory Visit: Payer: No Typology Code available for payment source | Admitting: Internal Medicine

## 2020-09-10 ENCOUNTER — Encounter: Payer: Self-pay | Admitting: Internal Medicine

## 2020-09-21 ENCOUNTER — Ambulatory Visit (INDEPENDENT_AMBULATORY_CARE_PROVIDER_SITE_OTHER): Payer: No Typology Code available for payment source | Admitting: Family Medicine

## 2020-09-21 ENCOUNTER — Encounter: Payer: Self-pay | Admitting: Family Medicine

## 2020-09-21 ENCOUNTER — Other Ambulatory Visit: Payer: Self-pay

## 2020-09-21 VITALS — BP 134/60 | HR 78 | Temp 98.0°F | Resp 16 | Ht 70.0 in | Wt 271.0 lb

## 2020-09-21 DIAGNOSIS — Z23 Encounter for immunization: Secondary | ICD-10-CM | POA: Diagnosis not present

## 2020-09-21 DIAGNOSIS — Z6837 Body mass index (BMI) 37.0-37.9, adult: Secondary | ICD-10-CM

## 2020-09-21 DIAGNOSIS — G4733 Obstructive sleep apnea (adult) (pediatric): Secondary | ICD-10-CM

## 2020-09-21 DIAGNOSIS — I10 Essential (primary) hypertension: Secondary | ICD-10-CM | POA: Diagnosis not present

## 2020-09-21 DIAGNOSIS — M2390 Unspecified internal derangement of unspecified knee: Secondary | ICD-10-CM | POA: Insufficient documentation

## 2020-09-21 DIAGNOSIS — R7303 Prediabetes: Secondary | ICD-10-CM | POA: Diagnosis not present

## 2020-09-21 DIAGNOSIS — M6208 Separation of muscle (nontraumatic), other site: Secondary | ICD-10-CM

## 2020-09-21 DIAGNOSIS — E785 Hyperlipidemia, unspecified: Secondary | ICD-10-CM

## 2020-09-21 MED ORDER — LISINOPRIL-HYDROCHLOROTHIAZIDE 20-25 MG PO TABS
1.0000 | ORAL_TABLET | Freq: Every day | ORAL | 1 refills | Status: DC
Start: 2020-09-21 — End: 2021-05-31

## 2020-09-21 MED ORDER — ATORVASTATIN CALCIUM 40 MG PO TABS: 40.0000 mg | ORAL_TABLET | Freq: Every day | ORAL | 3 refills | Status: DC

## 2020-09-21 NOTE — Progress Notes (Signed)
Name: Eric English.   MRN: [id number]    DOB: 04/10/1956   Date:09/21/2020       Progress Note  Subjective  Chief Complaint  Chief Complaint  Patient presents with  . Hyperlipidemia  . Hypertension    Follow up, medication refills    HPI  HTN: he is compliant with medication, denies chest pain or palpitation. Reviewed his last labs done 02/2021. He was having SOB after his regular runs and work outs, he was seen by cardiologist and had a negative cardiac score. He states symptoms are stable now   Hyperglycemia: hgbA1C as high as 6.4 % in the past but staying lower since. Reviewed a low carb diet. Continue physical activity.   Elevated PSA: he followed up by [facility] back in 2016, he was advised to follow up in one year, but lost to follow up. He was advised in 2019 to return for CPE with me but did not happen either. Last level of PSA back in 2016 was back to normal   From Urologist at [facility]: 01/2015  Elevated PSA, resolved; this was presumably related to an infection.  Will check a PSA today. Follow-up with me in 12 months to repeat PSA.  If PSA rises again, I will recommend MRI prior to another biopsy.   Morbid obesity: BMI above 35 with co-morbidities such as pre-diabetes, HTN and dyslipidemia. Explained importance of portion control   Diathesis recti: he is not interested in having surgery at this time   Hyperlipidemia: taking atorvastatin, denies side effects of medication, last level was good at 35   Patient Active Problem List   Diagnosis Date Noted  . Internal derangement of knee 09/21/2020  . Class 2 severe obesity due to excess calories with serious comorbidity and body mass index (BMI) of 37.0 to 37.9 in adult ([place]) 02/27/2020  . Dyspnea on exertion 02/27/2020  . Orthostatic dizziness 02/27/2020  . History of elevated PSA 08/12/2019  . Obstructive sleep apnea syndrome 08/12/2019  . Mixed hyperlipidemia 03/18/2019  . Prediabetes 08/02/2018  . Chronic left  shoulder pain 01/04/2018  . Chronic seasonal allergic rhinitis due to pollen 09/19/2016  . Hemorrhoids 07/15/2016  . 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 Elevated liver enzymes 10/08/2015  . Screening for gout 07/11/2015  . Superficial thrombophlebitis 06/23/2015  . Diastasis recti 12/27/2013  . Benign essential HTN 03/15/2010    Past Surgical History:  Procedure Laterality Date  . COLONOSCOPY  2006  . COLONOSCOPY WITH PROPOFOL N/A 08/15/2015   Procedure: COLONOSCOPY WITH PROPOFOL;  Surgeon: Robert Bellow, MD;  Location: [facility] ENDOSCOPY;  Service: Endoscopy;  Laterality: N/A;  . HERNIA REPAIR  0102   umbilical  . VASECTOMY  [id number]    Family History  Problem Relation Age of Onset  . Diabetes Mother   . Hypertension Mother   . CAD Mother   . Hyperlipidemia Mother   . Heart disease Father   . CAD Father   . Diabetes Father     Social History   Tobacco Use  . Smoking status: Former Smoker    Packs/day: 1.00    Years: 24.00    Pack years: 24.00    Types: Cigarettes  . Smokeless tobacco: Never Used  Substance Use Topics  . Alcohol use: Yes    Alcohol/week: 0.0 standard drinks    Comment: Occasionally     Current Outpatient Medications:  .  B Complex Vitamins (VITAMIN B COMPLEX PO), Take 1 tablet by mouth  daily., Disp: , Rfl:  .  fluticasone (FLONASE) 50 MCG/ACT nasal spray, Place 2 sprays into both nostrils daily., Disp: 16 g, Rfl: 0 .  loratadine (CLARITIN) 10 MG tablet, Take 1 tablet (10 mg total) by mouth daily., Disp: 90 tablet, Rfl: 3 .  Omega-3 Fatty Acids (FISH OIL) 1200 MG CAPS, Take 1 capsule by mouth 2 (two) times daily., Disp: , Rfl:  .  tadalafil (CIALIS) 5 MG tablet, Take 1 tablet (5 mg total) by mouth daily as needed for erectile dysfunction., Disp: 90 tablet, Rfl: 1 .  atorvastatin (LIPITOR) 40 MG tablet, Take 1 tablet (40 mg total) by mouth daily., Disp: 90 tablet, Rfl: 3 .  lisinopril-hydrochlorothiazide (ZESTORETIC) 20-25 MG tablet,  Take 1 tablet by mouth daily., Disp: 90 tablet, Rfl: 1  No Known Allergies  I personally reviewed active problem list, medication list, allergies, family history, social history, health maintenance with the patient/caregiver today.   ROS  Constitutional: Negative for fever or weight change.  Respiratory: Negative for cough but has  shortness of breath at the end of activity - seen by cardiologist .   Cardiovascular: Negative for chest pain or palpitations.  Gastrointestinal: Negative for abdominal pain, no bowel changes.  Musculoskeletal: Negative for gait problem or joint swelling.  Skin: Negative for rash.  Neurological: Negative for dizziness or headache.  No other specific complaints in a complete review of systems (except as listed in HPI above).  Objective  Vitals:   09/21/20 1007  BP: 134/60  Pulse: 78  Resp: 16  Temp: 98 F (36.7 C)  TempSrc: Oral  SpO2: 98%  Weight: 271 lb (122.9 kg)  Height: 5\' 10"  (1.778 m)    Body mass index is 38.88 kg/m.  Physical Exam  Constitutional: Patient appears well-developed and well-nourished. Obese No distress.  HEENT: head atraumatic, normocephalic, pupils equal and reactive to light, , neck supple Cardiovascular: Normal rate, regular rhythm and normal heart sounds.  No murmur heard. No BLE edema. Pulmonary/Chest: Effort normal and breath sounds normal. No respiratory distress. Abdominal: Soft.  There is no tenderness. Psychiatric: Patient has a normal mood and affect. behavior is normal. Judgment and thought content normal.  Recent Results (from the past 2160 hour(s))  ECHOCARDIOGRAM COMPLETE     Status: None   Collection Time: 07/20/20  8:03 AM  Result Value Ref Range   AR max vel 3.72 cm2   AV Peak grad 8.6 mmHg   Ao pk vel 1.47 m/s   S' Lateral 2.70 cm   Area-P 1/2 4.12 cm2   AV Area VTI 3.88 cm2   AV Mean grad 5.0 mmHg   Single Plane A4C EF 59.9 %   Single Plane A2C EF 58.0 %   Calc EF 59.7 %   AV Area mean vel  3.34 cm2  I-STAT creatinine     Status: None   Collection Time: 08/06/20  8:24 AM  Result Value Ref Range   Creatinine, Ser 1.00 0.61 - 1.24 mg/dL      PHQ2/9: Depression screen [facility] 2/9 09/21/2020 02/27/2020 08/12/2019 03/18/2019 07/30/2018  Decreased Interest 0 0 0 0 0  Down, Depressed, Hopeless 0 0 0 0 0  PHQ - 2 Score 0 0 0 0 0  Altered sleeping - - 0 0 -  Tired, decreased energy - - 1 0 -  Change in appetite - - 0 0 -  Feeling bad or failure about yourself  - - 0 0 -  Trouble concentrating - - 0 0 -  Moving slowly or fidgety/restless - - 0 0 -  Suicidal thoughts - - 0 0 -  PHQ-9 Score - - 1 0 -  Difficult doing work/chores - - Not difficult at all Not difficult at all -    phq 9 is negative   Fall Risk: Fall Risk  09/21/2020 02/27/2020 08/12/2019 03/18/2019 05/14/2018  Falls in the past year? 0 0 0 0 0  Number falls in past yr: 0 0 0 0 -  Injury with Fall? 0 0 0 0 -  Follow up Falls evaluation completed Falls evaluation completed - - -     Functional Status Survey: Is the patient deaf or have difficulty hearing?: No Does the patient have difficulty seeing, even when wearing glasses/contacts?: No Does the patient have difficulty concentrating, remembering, or making decisions?: No Does the patient have difficulty walking or climbing stairs?: No Does the patient have difficulty dressing or bathing?: No Does the patient have difficulty doing errands alone such as visiting a doctor's office or shopping?: No    Assessment & Plan  1. Benign essential HTN  - lisinopril-hydrochlorothiazide (ZESTORETIC) 20-25 MG tablet; Take 1 tablet by mouth daily.  Dispense: 90 tablet; Refill: 1  2. Prediabetes  Discussed low carb diet   3. Obstructive sleep apnea syndrome  Continue CPAP   4. Class 2 severe obesity due to excess calories with serious comorbidity and body mass index (BMI) of 37.0 to 37.9 in adult [facility])  Discussed with the patient the risk posed by an increased BMI.  Discussed importance of portion control, calorie counting and at least 150 minutes of physical activity weekly. Avoid sweet beverages and drink more water. Eat at least 6 servings of fruit and vegetables daily   5. Dyslipidemia  - atorvastatin (LIPITOR) 40 MG tablet; Take 1 tablet (40 mg total) by mouth daily.  Dispense: 90 tablet; Refill: 3  6. Diastasis of rectus abdominis

## 2020-11-21 ENCOUNTER — Telehealth: Payer: Self-pay

## 2020-11-21 DIAGNOSIS — Z006 Encounter for examination for normal comparison and control in clinical research program: Secondary | ICD-10-CM

## 2020-11-21 NOTE — Telephone Encounter (Signed)
I called patient for his 90-day Identify Study follow up phone call. Patient is doing well with no cardiac symptoms at this time. I reminded patient I would call him in February for his 1 year follow-up.

## 2020-11-23 ENCOUNTER — Ambulatory Visit: Payer: No Typology Code available for payment source

## 2020-12-03 ENCOUNTER — Other Ambulatory Visit: Payer: Self-pay

## 2020-12-03 ENCOUNTER — Ambulatory Visit (INDEPENDENT_AMBULATORY_CARE_PROVIDER_SITE_OTHER): Payer: Medicare Other | Admitting: Emergency Medicine

## 2020-12-03 DIAGNOSIS — Z23 Encounter for immunization: Secondary | ICD-10-CM

## 2020-12-03 NOTE — Progress Notes (Signed)
r 

## 2021-02-06 ENCOUNTER — Telehealth: Payer: Self-pay | Admitting: Cardiology

## 2021-02-06 NOTE — Telephone Encounter (Signed)
-----   Message from Kavin Leech, RN sent at 02/05/2021  5:05 PM EDT ----- Regarding: recall missed Please schedule this patient, somehow his recall was missed and his follow up should have been in August.  Thanks,  Coleen Keota RN   \

## 2021-02-06 NOTE — Telephone Encounter (Signed)
Called patient to schedule Call picked up and dropped

## 2021-02-17 ENCOUNTER — Ambulatory Visit (INDEPENDENT_AMBULATORY_CARE_PROVIDER_SITE_OTHER): Payer: Medicare Other

## 2021-02-17 ENCOUNTER — Other Ambulatory Visit: Payer: Self-pay

## 2021-02-17 ENCOUNTER — Ambulatory Visit
Admission: EM | Admit: 2021-02-17 | Discharge: 2021-02-17 | Disposition: A | Discharge status: Home / Self Care | Payer: Medicare Other | Attending: Physician Assistant | Admitting: Physician Assistant

## 2021-02-17 DIAGNOSIS — W19XXXA Unspecified fall, initial encounter: Secondary | ICD-10-CM

## 2021-02-17 DIAGNOSIS — M79644 Pain in right finger(s): Secondary | ICD-10-CM

## 2021-02-17 DIAGNOSIS — S6991XA Unspecified injury of right wrist, hand and finger(s), initial encounter: Secondary | ICD-10-CM

## 2021-02-17 DIAGNOSIS — M6530 Trigger finger, unspecified finger: Secondary | ICD-10-CM | POA: Diagnosis not present

## 2021-02-17 NOTE — Discharge Instructions (Addendum)
Follow up with ortho when you get back home

## 2021-02-17 NOTE — ED Triage Notes (Signed)
Pt here with complaint for right thumb pain for 3 weeks, fell caught himself with hand. Unable to fully bend it since.

## 2021-02-17 NOTE — ED Provider Notes (Signed)
[facility]    CSN: [id number] Arrival date & time: 02/17/21  1500      History   Chief Complaint Chief Complaint  Patient presents with   Finger Injury    HPI Sulaiman Fintel. is a 65 y.o. male who presents with R thumb pain x 3 weeks. He fell and caught himself with his hand and injured his thumb. Since then he can fully bend it and has a click. He tripped on a hole while walking and fell onto his R palm. Pain is located on volar proximal thumb region.     Past Medical History:  Diagnosis Date   Abnormal glucose    Hemorrhoids    Hypertension    Prostate enlargement    Sleep apnea    Trigger finger     Patient Active Problem List   Diagnosis Date Noted   Internal derangement of knee 09/21/2020   Class 2 severe obesity due to excess calories with serious comorbidity and body mass index (BMI) of 37.0 to 37.9 in adult [facility]) 02/27/2020   Dyspnea on exertion 02/27/2020   Orthostatic dizziness 02/27/2020   History of elevated PSA 08/12/2019   Obstructive sleep apnea syndrome 08/12/2019   Mixed hyperlipidemia 03/18/2019   Prediabetes 08/02/2018   Chronic left shoulder pain 01/04/2018   Chronic seasonal allergic rhinitis due to pollen 09/19/2016   Hemorrhoids 07/15/2016   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   Elevated liver enzymes 10/08/2015   Screening for gout 07/11/2015   Superficial thrombophlebitis 06/23/2015   Diastasis recti 12/27/2013   Benign essential HTN 03/15/2010    Past Surgical History:  Procedure Laterality Date   COLONOSCOPY  2006   COLONOSCOPY WITH PROPOFOL N/A 08/15/2015   Procedure: COLONOSCOPY WITH PROPOFOL;  Surgeon: Robert Bellow, MD;  Location: [facility] ENDOSCOPY;  Service: Endoscopy;  Laterality: N/A;   HERNIA REPAIR  [id number]   umbilical   VASECTOMY  [id number]       Home Medications    Prior to Admission medications   Medication Sig Start Date End Date Taking? Authorizing Provider  atorvastatin (LIPITOR) 40 MG  tablet Take 1 tablet (40 mg total) by mouth daily. 09/21/20  Yes Sowles, Drue Stager, MD  B Complex Vitamins (VITAMIN B COMPLEX PO) Take 1 tablet by mouth daily.   Yes [provider]  fluticasone (FLONASE) 50 MCG/ACT nasal spray Place 2 sprays into both nostrils daily. 03/18/19  Yes Hubbard Hartshorn, FNP  lisinopril-hydrochlorothiazide (ZESTORETIC) 20-25 MG tablet Take 1 tablet by mouth daily. 09/21/20  Yes Sowles, Drue Stager, MD  loratadine (CLARITIN) 10 MG tablet Take 1 tablet (10 mg total) by mouth daily. 03/18/19  Yes Hubbard Hartshorn, FNP  Omega-3 Fatty Acids (FISH OIL) 1200 MG CAPS Take 1 capsule by mouth 2 (two) times daily.   Yes [provider]    Family History Family History  Problem Relation Age of Onset   Diabetes Mother    Hypertension Mother    CAD Mother    Hyperlipidemia Mother    Heart disease Father    CAD Father    Diabetes Father     Social History Social History   Tobacco Use   Smoking status: Former    Packs/day: 1.00    Years: 24.00    Pack years: 24.00    Types: Cigarettes   Smokeless tobacco: Never  Substance Use Topics   Alcohol use: Yes    Alcohol/week: 0.0 standard drinks    Comment: Occasionally  Drug use: No     Allergies   Patient has no known allergies.   Review of Systems Review of Systems + R thumb pain and decreased ROM, also has a trigger finger on R middle finger. Denies paresthesia.   Physical Exam Triage Vital Signs ED Triage Vitals  Enc Vitals Group     BP --      Pulse Rate 02/17/21 1527 63     Resp 02/17/21 1527 18     Temp 02/17/21 1527 98.4 F (36.9 C)     Temp Source 02/17/21 1527 Oral     SpO2 02/17/21 1527 97 %     Weight 02/17/21 1525 270 lb (122.5 kg)     Height 02/17/21 1525 '5\' 10"'$  (1.778 m)     Head Circumference --      Peak Flow --      Pain Score 02/17/21 1525 8     Pain Loc --      Pain Edu? --      Excl. in [place]? --    No data found.  Updated Vital Signs Pulse 63   Temp 98.4 F (36.9 C)  (Oral)   Resp 18   Ht '5\' 10"'$  (1.778 m)   Wt 270 lb (122.5 kg)   SpO2 97%   BMI 38.74 kg/m   Visual Acuity Right Eye Distance:   Left Eye Distance:   Bilateral Distance:    Right Eye Near:   Left Eye Near:    Bilateral Near:     Physical Exam Vitals and nursing note reviewed.  Constitutional:      General: He is not in acute distress.    Appearance: He is obese. He is not toxic-appearing.  HENT:     Head: Normocephalic.     Right Ear: External ear normal.     Left Ear: External ear normal.  Eyes:     General: No scleral icterus.    Conjunctiva/sclera: Conjunctivae normal.  Cardiovascular:     Pulses: Normal pulses.  Pulmonary:     Effort: Pulmonary effort is normal.  Musculoskeletal:     Cervical back: Neck supple.     Comments: R THUMB- with bony prominence on proximal volar joint region which is tender and not present on the other side. He can flex the distal joint,but cant pitch well. Thumb strength is normal. Has some laxity of the proximal joint region. Also has locking with flexion and extension of distal thumb.   Skin:    General: Skin is warm and dry.     Capillary Refill: Capillary refill takes less than 2 seconds.     Findings: No rash.  Neurological:     Mental Status: He is alert and oriented to person, place, and time.     Gait: Gait normal.  Psychiatric:        Mood and Affect: Mood normal.        Behavior: Behavior normal.        Thought Content: Thought content normal.        Judgment: Judgment normal.     UC Treatments / Results  Labs (all labs ordered are listed, but only abnormal results are displayed) Labs Reviewed - No data to display  EKG   Radiology DG Hand Complete Right  Result Date: 02/17/2021 CLINICAL DATA:  Hand pain. Patient reports right thumb pain for 3 weeks. Fall catching himself with hand. EXAM: RIGHT HAND - COMPLETE 3+ VIEW COMPARISON:  None. FINDINGS: There is no evidence  of fracture or dislocation. Mild osteoarthritis  throughout the digits as well as the thumb carpal metacarpal joint. Soft tissues are unremarkable. IMPRESSION: 1. No acute fracture or dislocation. 2. Mild osteoarthritis. Electronically Signed   By: Keith Rake M.D.   On: 02/17/2021 15:48    Procedures Procedures (including critical care time)  Medications Ordered in UC Medications - No data to display  Initial Impression / Assessment and Plan / UC Course  I have reviewed the triage vital signs and the nursing notes. Pertinent imaging results that were available during my care of the patient were reviewed by me and considered in my medical decision making (see chart for details). R Thumb strain with possibly ligament injury and has trigger finger Needs to FU with ortho when he goes back to [place], where he works.     Final Clinical Impressions(s) / UC Diagnoses   Final diagnoses:  Injury of right thumb, initial encounter  Trigger finger, acquired     Discharge Instructions      Follow up with ortho when you get back home      ED Prescriptions   None    PDMP not reviewed this encounter.   Shelby Mattocks, [place] 02/17/21 [phone]

## 2021-03-11 DIAGNOSIS — M65331 Trigger finger, right middle finger: Secondary | ICD-10-CM | POA: Diagnosis not present

## 2021-03-11 DIAGNOSIS — M65311 Trigger thumb, right thumb: Secondary | ICD-10-CM | POA: Diagnosis not present

## 2021-03-22 ENCOUNTER — Ambulatory Visit: Payer: No Typology Code available for payment source | Admitting: Family Medicine

## 2021-03-29 ENCOUNTER — Encounter: Payer: Self-pay | Admitting: Cardiology

## 2021-03-29 ENCOUNTER — Other Ambulatory Visit: Payer: Self-pay

## 2021-03-29 ENCOUNTER — Ambulatory Visit (INDEPENDENT_AMBULATORY_CARE_PROVIDER_SITE_OTHER): Payer: Medicare Other | Admitting: Cardiology

## 2021-03-29 VITALS — BP 140/64 | HR 86 | Ht 70.5 in | Wt 268.0 lb

## 2021-03-29 DIAGNOSIS — Z6837 Body mass index (BMI) 37.0-37.9, adult: Secondary | ICD-10-CM

## 2021-03-29 DIAGNOSIS — I2584 Coronary atherosclerosis due to calcified coronary lesion: Secondary | ICD-10-CM | POA: Diagnosis not present

## 2021-03-29 DIAGNOSIS — I10 Essential (primary) hypertension: Secondary | ICD-10-CM

## 2021-03-29 DIAGNOSIS — I2510 Atherosclerotic heart disease of native coronary artery without angina pectoris: Secondary | ICD-10-CM

## 2021-03-29 DIAGNOSIS — Z20822 Contact with and (suspected) exposure to covid-19: Secondary | ICD-10-CM | POA: Diagnosis not present

## 2021-03-29 DIAGNOSIS — E7800 Pure hypercholesterolemia, unspecified: Secondary | ICD-10-CM

## 2021-03-29 NOTE — Patient Instructions (Signed)

## 2021-03-29 NOTE — Progress Notes (Signed)
Cardiology Office Note:    Date:  03/29/2021   ID:  Eric Maxon., DOB Oct 11, 1955, MRN [id number]  PCP:  Steele Sizer, MD   [facility] Providers Cardiologist:  Kate Sable, MD     Referring MD: Steele Sizer, MD   Chief Complaint  Patient presents with   Other    6 month follow up -- Meds reviewed verbally with patient.     History of Present Illness:    Eric Ellithorpe. is a 65 y.o. male with a hx of hypertension, hyperlipidemia, former smoker who presents for follow-up.  Previously seen for shortness of breath on exertion.  Echocardiogram showed preserved ejection fraction, coronary CTA showed minimal nonobstructive CAD.  Symptoms of dyspnea on attributed to anxiety, resolved after last visit.  Denies chest pain, states having lower extremity edema which worsens as the day progresses.  He used to work on more frequently in the gym, stopped doing that after he lifted weights and injured his left shoulder.  He has been eating healthier and swelling is improving with weight loss.  Takes all medications as prescribed.  Prior notes Echocardiogram 07/7780 normal systolic function, grade 2 diastolic dysfunction. Coronary CTA 07/2020 calcium score 6.1, minimal nonobstructive mid LAD disease.  Past Medical History:  Diagnosis Date   Abnormal glucose    Hemorrhoids    Hypertension    Prostate enlargement    Sleep apnea    Trigger finger     Past Surgical History:  Procedure Laterality Date   COLONOSCOPY  2006   COLONOSCOPY WITH PROPOFOL N/A 08/15/2015   Procedure: COLONOSCOPY WITH PROPOFOL;  Surgeon: Robert Bellow, MD;  Location: [facility] ENDOSCOPY;  Service: Endoscopy;  Laterality: N/A;   HERNIA REPAIR  4235   umbilical   VASECTOMY  3614    Current Medications: Current Meds  Medication Sig   atorvastatin (LIPITOR) 40 MG tablet Take 1 tablet (40 mg total) by mouth daily.   B Complex Vitamins (VITAMIN B COMPLEX PO) Take 1 tablet by mouth daily.    fluticasone (FLONASE) 50 MCG/ACT nasal spray Place 2 sprays into both nostrils daily.   lisinopril-hydrochlorothiazide (ZESTORETIC) 20-25 MG tablet Take 1 tablet by mouth daily.   loratadine (CLARITIN) 10 MG tablet Take 1 tablet (10 mg total) by mouth daily.   Omega-3 Fatty Acids (FISH OIL) 1200 MG CAPS Take 1 capsule by mouth 2 (two) times daily.     Allergies:   Patient has no known allergies.   Social History   Socioeconomic History   Marital status: Married    Spouse name: Junious Dresser    Number of children: 3   Years of education: Not on file   Highest education level: Bachelor's degree (e.g., BA, AB, BS)  Occupational History    Employer: [employer]  Tobacco Use   Smoking status: Former    Packs/day: 1.00    Years: 24.00    Pack years: 24.00    Types: Cigarettes   Smokeless tobacco: Never  Substance and Sexual Activity   Alcohol use: Yes    Alcohol/week: 0.0 standard drinks    Comment: Occasionally   Drug use: No   Sexual activity: Not on file  Other Topics Concern   Not on file  Social History Narrative   Married, wife and children live in [place], he works in [place] , retired from [employer] and also from [employer] Strain: Not on file  Food Insecurity: Not on file  Transportation Needs: Not on file  Physical Activity: Not on file  Stress: Not on file  Social Connections: Not on file     Family History: The patient's family history includes CAD in his father and mother; Diabetes in his father and mother; Heart disease in his father; Hyperlipidemia in his mother; Hypertension in his mother.  ROS:   Please see the history of present illness.     All other systems reviewed and are negative.  EKGs/Labs/Other Studies Reviewed:    The following studies were reviewed today:   EKG:  EKG is  ordered today.  The ekg ordered today demonstrates normal sinus rhythm, possible old inferior infarct.  Recent  Labs: 08/06/2020: Creatinine, Ser 1.00  Recent Lipid Panel    Component Value Date/Time   CHOL 82 02/27/2020 0834   CHOL 183 12/25/2014 0844   TRIG 62 02/27/2020 0834   HDL 33 (L) 02/27/2020 0834   HDL 41 12/25/2014 0844   CHOLHDL 2.5 02/27/2020 0834   VLDL 25 03/19/2019 0903   LDLCALC 35 02/27/2020 0834     Risk Assessment/Calculations:          Physical Exam:    VS:  BP 140/64 (BP Location: Left Arm, Patient Position: Sitting, Cuff Size: Large)   Pulse 86   Ht 5' 10.5" (1.791 m)   Wt 268 lb (121.6 kg)   SpO2 93%   BMI 37.91 kg/m     Wt Readings from Last 3 Encounters:  03/29/21 268 lb (121.6 kg)  02/17/21 270 lb (122.5 kg)  09/21/20 271 lb (122.9 kg)     GEN:  Well nourished, well developed in no acute distress HEENT: Normal NECK: No JVD; No carotid bruits LYMPHATICS: No lymphadenopathy CARDIAC: RRR, no murmurs, rubs, gallops RESPIRATORY:  Clear to auscultation without rales, wheezing or rhonchi  ABDOMEN: Soft, non-tender, non-distended MUSCULOSKELETAL:  No edema; No deformity  SKIN: Warm and dry NEUROLOGIC:  Alert and oriented x 3 PSYCHIATRIC:  Normal affect   ASSESSMENT:    1. Primary hypertension   2. Pure hypercholesterolemia   3. Coronary artery calcification   4. BMI 37.0-37.9, adult    PLAN:    In order of problems listed above:  Hypertension, BP controlled.  Continue HCTZ, lisinopril. Hyperlipidemia, last LDL and total cholesterol controlled.  Continue Lipitor. Coronary artery calcification, calcium score 6.1.  Continue Lipitor. Obesity, continued weight loss and exercise advised.  Low-calorie diet advised.  Follow-up yearly    Medication Adjustments/Labs and Tests Ordered: Current medicines are reviewed at length with the patient today.  Concerns regarding medicines are outlined above.  Orders Placed This Encounter  Procedures   EKG 12-Lead   No orders of the defined types were placed in this encounter.   Patient Instructions   Medication Instructions:  Your physician recommends that you continue on your current medications as directed. Please refer to the Current Medication list given to you today.  *If you need a refill on your cardiac medications before your next appointment, please call your pharmacy*   Lab Work: None ordered If you have labs (blood work) drawn today and your tests are completely normal, you will receive your results only by: [place] (if you have MyChart) OR A paper copy in the mail If you have any lab test that is abnormal or we need to change your treatment, we will call you to review the results.   Testing/Procedures: None ordered   Follow-Up: At [facility], you  and your health needs are our priority.  As part of our continuing mission to provide you with exceptional heart care, we have created designated Provider Care Teams.  These Care Teams include your primary Cardiologist (physician) and Advanced Practice Providers (APPs -  Physician Assistants and Nurse Practitioners) who all work together to provide you with the care you need, when you need it.  We recommend signing up for the patient portal called "MyChart".  Sign up information is provided on this After Visit Summary.  MyChart is used to connect with patients for Virtual Visits (Telemedicine).  Patients are able to view lab/test results, encounter notes, upcoming appointments, etc.  Non-urgent messages can be sent to your provider as well.   To learn more about what you can do with MyChart, go to [email or web address].    Your next appointment:   1 year(s)  The format for your next appointment:   In Person  Provider:   Kate Sable, MD   Other Instructions    Signed, Kate Sable, MD  03/29/2021 4:59 PM    [place]

## 2021-04-05 ENCOUNTER — Ambulatory Visit: Payer: Medicare Other | Admitting: Family Medicine

## 2021-05-30 ENCOUNTER — Other Ambulatory Visit: Payer: Self-pay

## 2021-05-30 ENCOUNTER — Telehealth: Payer: Self-pay

## 2021-05-30 DIAGNOSIS — I10 Essential (primary) hypertension: Secondary | ICD-10-CM

## 2021-05-30 NOTE — Telephone Encounter (Signed)
Pt had to resch due to weather in [place] and has already rescheduled for Jan but is asking for a 30 day supply of Lisiniprel to go to [facility] on file in [employer].

## 2021-05-31 ENCOUNTER — Other Ambulatory Visit: Payer: Self-pay | Admitting: Family Medicine

## 2021-05-31 ENCOUNTER — Ambulatory Visit: Payer: Medicare Other | Admitting: Family Medicine

## 2021-05-31 DIAGNOSIS — I10 Essential (primary) hypertension: Secondary | ICD-10-CM

## 2021-05-31 MED ORDER — LISINOPRIL-HYDROCHLOROTHIAZIDE 20-25 MG PO TABS: 1.0000 | ORAL_TABLET | Freq: Every day | ORAL | 0 refills | Status: DC

## 2021-07-01 ENCOUNTER — Encounter: Payer: Self-pay | Admitting: Nurse Practitioner

## 2021-07-01 ENCOUNTER — Ambulatory Visit (INDEPENDENT_AMBULATORY_CARE_PROVIDER_SITE_OTHER): Payer: Medicare Other | Admitting: Nurse Practitioner

## 2021-07-01 ENCOUNTER — Other Ambulatory Visit: Payer: Self-pay

## 2021-07-01 VITALS — BP 136/68 | HR 76 | Temp 98.3°F | Resp 16 | Ht 70.0 in | Wt 278.3 lb

## 2021-07-01 DIAGNOSIS — Z6839 Body mass index (BMI) 39.0-39.9, adult: Secondary | ICD-10-CM | POA: Diagnosis not present

## 2021-07-01 DIAGNOSIS — J301 Allergic rhinitis due to pollen: Secondary | ICD-10-CM

## 2021-07-01 DIAGNOSIS — Z79899 Other long term (current) drug therapy: Secondary | ICD-10-CM | POA: Diagnosis not present

## 2021-07-01 DIAGNOSIS — Z131 Encounter for screening for diabetes mellitus: Secondary | ICD-10-CM

## 2021-07-01 DIAGNOSIS — E785 Hyperlipidemia, unspecified: Secondary | ICD-10-CM

## 2021-07-01 DIAGNOSIS — R7303 Prediabetes: Secondary | ICD-10-CM | POA: Diagnosis not present

## 2021-07-01 DIAGNOSIS — I10 Essential (primary) hypertension: Secondary | ICD-10-CM

## 2021-07-01 MED ORDER — FLUTICASONE PROPIONATE 50 MCG/ACT NA SUSP: 2.0000 | Freq: Every day | NASAL | 1 refills | Status: DC

## 2021-07-01 MED ORDER — ATORVASTATIN CALCIUM 40 MG PO TABS: 40.0000 mg | ORAL_TABLET | Freq: Every day | ORAL | 3 refills | Status: DC

## 2021-07-01 MED ORDER — LISINOPRIL-HYDROCHLOROTHIAZIDE 20-25 MG PO TABS: 1.0000 | ORAL_TABLET | Freq: Every day | ORAL | 0 refills | Status: DC

## 2021-07-01 NOTE — Progress Notes (Signed)
BP 136/68    Pulse 76    Temp 98.3 F (36.8 C)    Resp 16    Ht 5\' 10"  (1.778 m)    Wt 278 lb 4.8 oz (126.2 kg)    SpO2 98%    BMI 39.93 kg/m    Subjective:    Patient ID: Eric Maxon., male    DOB: 02-25-1956, 66 y.o.   MRN: [id number]  HPI: Eric Popelka. is a 66 y.o. male, here alone  Chief Complaint  Patient presents with   Follow-up   Hypertension   Hyperlipidemia   HTN: He says he does not check his blood pressure at home.  He denies any chest pain, shortness of breath, headaches or blurred vision. He is currently taking lisinopril-hydrochlorothiazide 20-25 mg daily.  He says he takes his medication every day. His blood pressure today was 136/68.  Chronic seasonal allergies:  He says he uses over the counter loratadine to help with his allergies.  He says he does need his Flonase refilled. Will send in refill.   dyslipidemia: He is currently taking atorvastatin 40 mg daily.  He says he takes it everyday.  He denies any myalgia.  His last LDL was 35 on 02/26/2021. We will get labs today.    Prediabetes: His last A1C was 6.2 on 02/27/2020.  Will get labs today. He denies any polyuria, polydipsia or polyphagia.  He says he exercises but does not do well on his diet. He says he usually resorts to fast food due to convenience.   Obesity: His current weight is 278 lbs with a BMI of 39.93.  He says that he tries to park far away where he is going to get in physical activity because he works a Designer, [employer].  He says that he mostly resorts to fast food due to convenience but will occasionally cook healthier options.   Relevant past medical, surgical, family and social history reviewed and updated as indicated. Interim medical history since our last visit reviewed. Allergies and medications reviewed and updated.  Review of Systems  Constitutional: Negative for fever or weight change.  Respiratory: Negative for cough and shortness of breath.   Cardiovascular: Negative for chest  pain or palpitations.  Gastrointestinal: Negative for abdominal pain, no bowel changes.  Musculoskeletal: Negative for gait problem or joint swelling.  Skin: Negative for rash.  Neurological: Negative for dizziness or headache.  No other specific complaints in a complete review of systems (except as listed in HPI above).      Objective:    BP 136/68    Pulse 76    Temp 98.3 F (36.8 C)    Resp 16    Ht 5\' 10"  (1.778 m)    Wt 278 lb 4.8 oz (126.2 kg)    SpO2 98%    BMI 39.93 kg/m   Wt Readings from Last 3 Encounters:  07/01/21 278 lb 4.8 oz (126.2 kg)  03/29/21 268 lb (121.6 kg)  02/17/21 270 lb (122.5 kg)    Physical Exam  Constitutional: Patient appears well-developed and well-nourished. Obese  No distress.  HEENT: head atraumatic, normocephalic, pupils equal and reactive to light,  neck supple Cardiovascular: Normal rate, regular rhythm and normal heart sounds.  No murmur heard. No BLE edema. Pulmonary/Chest: Effort normal and breath sounds normal. No respiratory distress. Abdominal: Soft.  There is no tenderness. Psychiatric: Patient has a normal mood and affect. behavior is normal. Judgment and thought content normal.  Results for orders placed or performed during the hospital encounter of 08/06/20  I-STAT creatinine  Result Value Ref Range   Creatinine, Ser 1.00 0.61 - 1.24 mg/dL      Assessment & Plan:   1. Benign essential HTN  - lisinopril-hydrochlorothiazide (ZESTORETIC) 20-25 MG tablet; Take 1 tablet by mouth daily.  Dispense: 30 tablet; Refill: 0 - CBC with Differential/Platelet - Comprehensive Metabolic Panel (CMET)  2. Dyslipidemia  - atorvastatin (LIPITOR) 40 MG tablet; Take 1 tablet (40 mg total) by mouth daily.  Dispense: 90 tablet; Refill: 3 - Lipid panel  3. Chronic seasonal allergic rhinitis due to pollen  - fluticasone (FLONASE) 50 MCG/ACT nasal spray; Place 2 sprays into both nostrils daily.  Dispense: 16 g; Refill: 1  4. Prediabetes  -  Hemoglobin A1c - Comprehensive Metabolic Panel (CMET)  5. Class 2 severe obesity due to excess calories with serious comorbidity and body mass index (BMI) of 39.0 to 39.9 in adult [facility])  - Comprehensive Metabolic Panel (CMET)  6. Medication management  - CBC with Differential/Platelet - Hemoglobin A1c - Lipid panel - Comprehensive Metabolic Panel (CMET)  7. Screening for diabetes mellitus  - Hemoglobin A1c   Follow up plan: Return in about 6 months (around 12/29/2021) for follow up.

## 2021-07-02 LAB — CBC WITH DIFFERENTIAL/PLATELET
Basophils Absolute: 0 10*3/uL (ref 0.0–0.2)
Basos: 0 %
EOS (ABSOLUTE): 0.1 10*3/uL (ref 0.0–0.4)
Eos: 1 %
Hematocrit: 39.4 % (ref 37.5–51.0)
Hemoglobin: 13.5 g/dL (ref 13.0–17.7)
Immature Grans (Abs): 0 10*3/uL (ref 0.0–0.1)
Immature Granulocytes: 1 %
Lymphocytes Absolute: 1.5 10*3/uL (ref 0.7–3.1)
Lymphs: 23 %
MCH: 29.6 pg (ref 26.6–33.0)
MCHC: 34.3 g/dL (ref 31.5–35.7)
MCV: 86 fL (ref 79–97)
Monocytes Absolute: 0.8 10*3/uL (ref 0.1–0.9)
Monocytes: 12 %
Neutrophils Absolute: 4 10*3/uL (ref 1.4–7.0)
Neutrophils: 63 %
Platelets: 251 10*3/uL (ref 150–450)
RBC: 4.56 x10E6/uL (ref 4.14–5.80)
RDW: 13 % (ref 11.6–15.4)
WBC: 6.4 10*3/uL (ref 3.4–10.8)

## 2021-07-02 LAB — COMPREHENSIVE METABOLIC PANEL
ALT: 42 IU/L (ref 0–44)
AST: 24 IU/L (ref 0–40)
Albumin/Globulin Ratio: 1.2 (ref 1.2–2.2)
Albumin: 4.2 g/dL (ref 3.8–4.8)
Alkaline Phosphatase: 79 IU/L (ref 44–121)
BUN/Creatinine Ratio: 13 (ref 10–24)
BUN: 14 mg/dL (ref 8–27)
Bilirubin Total: 0.6 mg/dL (ref 0.0–1.2)
CO2: 25 mmol/L (ref 20–29)
Calcium: 9.8 mg/dL (ref 8.6–10.2)
Chloride: 98 mmol/L (ref 96–106)
Creatinine, Ser: 1.07 mg/dL (ref 0.76–1.27)
Globulin, Total: 3.6 g/dL (ref 1.5–4.5)
Glucose: 102 mg/dL — ABNORMAL HIGH (ref 70–99)
Potassium: 3.7 mmol/L (ref 3.5–5.2)
Sodium: 137 mmol/L (ref 134–144)
Total Protein: 7.8 g/dL (ref 6.0–8.5)
eGFR: 77 mL/min/{1.73_m2} (ref >59)

## 2021-07-02 LAB — LIPID PANEL
Chol/HDL Ratio: 2.7 ratio (ref 0.0–5.0)
Cholesterol, Total: 90 mg/dL — ABNORMAL LOW (ref 100–199)
HDL: 33 mg/dL — ABNORMAL LOW (ref >39)
LDL Chol Calc (NIH): 38 mg/dL (ref 0–99)
Triglycerides: 102 mg/dL (ref 0–149)
VLDL Cholesterol Cal: 19 mg/dL (ref 5–40)

## 2021-07-02 LAB — HEMOGLOBIN A1C
Est. average glucose Bld gHb Est-mCnc: 137 mg/dL
Hgb A1c MFr Bld: 6.4 % — ABNORMAL HIGH (ref 4.8–5.6)

## 2021-07-24 ENCOUNTER — Encounter: Payer: Self-pay | Admitting: Nurse Practitioner

## 2021-08-14 ENCOUNTER — Other Ambulatory Visit: Payer: Self-pay | Admitting: Nurse Practitioner

## 2021-08-14 DIAGNOSIS — I10 Essential (primary) hypertension: Secondary | ICD-10-CM

## 2021-08-14 MED ORDER — LISINOPRIL-HYDROCHLOROTHIAZIDE 20-25 MG PO TABS: 1.0000 | ORAL_TABLET | Freq: Every day | ORAL | 3 refills | Status: DC

## 2021-08-18 ENCOUNTER — Other Ambulatory Visit: Payer: Self-pay | Admitting: Family Medicine

## 2021-08-18 DIAGNOSIS — E785 Hyperlipidemia, unspecified: Secondary | ICD-10-CM

## 2021-10-13 ENCOUNTER — Other Ambulatory Visit: Payer: Self-pay | Admitting: Family Medicine

## 2021-10-13 DIAGNOSIS — E785 Hyperlipidemia, unspecified: Secondary | ICD-10-CM

## 2021-12-20 NOTE — Progress Notes (Deleted)
Name: Eric English.   MRN: [id number]    DOB: 09-09-1955   Date:12/20/2021       Progress Note  Subjective  Chief Complaint  Follow Up  HPI  HTN: he is compliant with medication, denies chest pain or palpitation. Reviewed his last labs done 02/2021. He was having SOB after his regular runs and work outs, he was seen by cardiologist and had a negative cardiac score. He states symptoms are stable now    Hyperglycemia: hgbA1C as high as 6.4 % in the past but staying lower since. Reviewed a low carb diet. Continue physical activity.    Elevated PSA: he followed up by [facility] back in 2016, he was advised to follow up in one year, but lost to follow up. He was advised in 2019 to return for CPE with me but did not happen either. Last level of PSA back in 2016 was back to normal   From Urologist at [facility]: 01/2015  Elevated PSA, resolved; this was presumably related to an infection.  Will check a PSA today. Follow-up with me in 12 months to repeat PSA.  If PSA rises again, I will recommend MRI prior to another biopsy.    Morbid obesity: BMI above 35 with co-morbidities such as pre-diabetes, HTN and dyslipidemia. Explained importance of portion control   Diathesis recti: he is not interested in having surgery at this time   Hyperlipidemia: taking atorvastatin, denies side effects of medication, last level was good at 35   Patient Active Problem List   Diagnosis Date Noted   Internal derangement of knee 09/21/2020   Class 2 severe obesity due to excess calories with serious comorbidity and body mass index (BMI) of 37.0 to 37.9 in adult [facility]) 02/27/2020   Dyspnea on exertion 02/27/2020   Orthostatic dizziness 02/27/2020   History of elevated PSA 08/12/2019   Obstructive sleep apnea syndrome 08/12/2019   Mixed hyperlipidemia 03/18/2019   Prediabetes 08/02/2018   Chronic left shoulder pain 01/04/2018   Chronic seasonal allergic rhinitis due to pollen 09/19/2016   Hemorrhoids 07/15/2016    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   Elevated liver enzymes 10/08/2015   Screening for gout 07/11/2015   Superficial thrombophlebitis 06/23/2015   Diastasis recti 12/27/2013   Benign essential HTN 03/15/2010    Past Surgical History:  Procedure Laterality Date   COLONOSCOPY  2006   COLONOSCOPY WITH PROPOFOL N/A 08/15/2015   Procedure: COLONOSCOPY WITH PROPOFOL;  Surgeon: Robert Bellow, MD;  Location: [facility] ENDOSCOPY;  Service: Endoscopy;  Laterality: N/A;   HERNIA REPAIR  8416   umbilical   VASECTOMY  6063    Family History  Problem Relation Age of Onset   Diabetes Mother    Hypertension Mother    CAD Mother    Hyperlipidemia Mother    Heart disease Father    CAD Father    Diabetes Father     Social History   Tobacco Use   Smoking status: Former    Packs/day: 1.00    Years: 24.00    Total pack years: 24.00    Types: Cigarettes   Smokeless tobacco: Never  Substance Use Topics   Alcohol use: Yes    Alcohol/week: 0.0 standard drinks of alcohol    Comment: Occasionally     Current Outpatient Medications:    atorvastatin (LIPITOR) 40 MG tablet, Take 1 tablet by mouth once daily, Disp: 90 tablet, Rfl: 0   B Complex Vitamins (VITAMIN B COMPLEX PO), Take  1 tablet by mouth daily., Disp: , Rfl:    fluticasone (FLONASE) 50 MCG/ACT nasal spray, Place 2 sprays into both nostrils daily., Disp: 16 g, Rfl: 1   lisinopril-hydrochlorothiazide (ZESTORETIC) 20-25 MG tablet, Take 1 tablet by mouth daily., Disp: 90 tablet, Rfl: 3   loratadine (CLARITIN) 10 MG tablet, Take 1 tablet (10 mg total) by mouth daily., Disp: 90 tablet, Rfl: 3   Omega-3 Fatty Acids (FISH OIL) 1200 MG CAPS, Take 1 capsule by mouth 2 (two) times daily., Disp: , Rfl:   No Known Allergies  I personally reviewed active problem list, medication list, allergies, family history, social history, health maintenance with the patient/caregiver today.   ROS  ***  Objective  There were no vitals  filed for this visit.  There is no height or weight on file to calculate BMI.  Physical Exam ***  No results found for this or any previous visit (from the past 2160 hour(s)).   PHQ2/9:    07/01/2021    8:36 AM 09/21/2020   10:06 AM 02/27/2020    7:39 AM 08/12/2019   11:04 AM 03/18/2019    9:58 AM  Depression screen PHQ 2/9  Decreased Interest 1 0 0 0 0  Down, Depressed, Hopeless 1 0 0 0 0  PHQ - 2 Score 2 0 0 0 0  Altered sleeping 0   0 0  Tired, decreased energy 0   1 0  Change in appetite 0   0 0  Feeling bad or failure about yourself  0   0 0  Trouble concentrating 0   0 0  Moving slowly or fidgety/restless 0   0 0  Suicidal thoughts 0   0 0  PHQ-9 Score 2   1 0  Difficult doing work/chores Not difficult at all   Not difficult at all Not difficult at all    phq 9 is {gen pos [id number]}   Fall Risk:    07/01/2021    8:34 AM 09/21/2020   10:06 AM 02/27/2020    7:39 AM 08/12/2019   10:55 AM 03/18/2019    9:58 AM  Fall Risk   Falls in the past year? 0 0 0 0 0  Number falls in past yr: 0 0 0 0 0  Injury with Fall? 0 0 0 0 0  Follow up  Falls evaluation completed Falls evaluation completed        Functional Status Survey:      Assessment & Plan  *** There are no diagnoses linked to this encounter.

## 2021-12-30 ENCOUNTER — Ambulatory Visit: Payer: Medicare Other | Admitting: Family Medicine

## 2022-01-31 ENCOUNTER — Ambulatory Visit: Payer: Medicare Other | Admitting: Family Medicine

## 2022-03-03 ENCOUNTER — Ambulatory Visit (INDEPENDENT_AMBULATORY_CARE_PROVIDER_SITE_OTHER): Payer: BLUE CROSS/BLUE SHIELD | Admitting: Family Medicine

## 2022-03-03 ENCOUNTER — Encounter: Payer: Self-pay | Admitting: Family Medicine

## 2022-03-03 VITALS — BP 124/70 | HR 87 | Resp 16 | Ht 70.0 in | Wt 263.0 lb

## 2022-03-03 DIAGNOSIS — J301 Allergic rhinitis due to pollen: Secondary | ICD-10-CM

## 2022-03-03 DIAGNOSIS — J302 Other seasonal allergic rhinitis: Secondary | ICD-10-CM | POA: Diagnosis not present

## 2022-03-03 DIAGNOSIS — Z23 Encounter for immunization: Secondary | ICD-10-CM | POA: Diagnosis not present

## 2022-03-03 DIAGNOSIS — I10 Essential (primary) hypertension: Secondary | ICD-10-CM

## 2022-03-03 DIAGNOSIS — R972 Elevated prostate specific antigen [PSA]: Secondary | ICD-10-CM

## 2022-03-03 DIAGNOSIS — R7303 Prediabetes: Secondary | ICD-10-CM | POA: Diagnosis not present

## 2022-03-03 DIAGNOSIS — G4733 Obstructive sleep apnea (adult) (pediatric): Secondary | ICD-10-CM

## 2022-03-03 DIAGNOSIS — G8929 Other chronic pain: Secondary | ICD-10-CM

## 2022-03-03 DIAGNOSIS — M25561 Pain in right knee: Secondary | ICD-10-CM | POA: Diagnosis not present

## 2022-03-03 DIAGNOSIS — J3089 Other allergic rhinitis: Secondary | ICD-10-CM | POA: Diagnosis not present

## 2022-03-03 DIAGNOSIS — E785 Hyperlipidemia, unspecified: Secondary | ICD-10-CM | POA: Diagnosis not present

## 2022-03-03 DIAGNOSIS — M25562 Pain in left knee: Secondary | ICD-10-CM

## 2022-03-03 DIAGNOSIS — Z125 Encounter for screening for malignant neoplasm of prostate: Secondary | ICD-10-CM | POA: Diagnosis not present

## 2022-03-03 MED ORDER — FLUTICASONE PROPIONATE 50 MCG/ACT NA SUSP: 2.0000 | Freq: Every day | NASAL | 1 refills | Status: DC

## 2022-03-03 MED ORDER — MELOXICAM 15 MG PO TABS: 15.0000 mg | ORAL_TABLET | Freq: Every day | ORAL | 0 refills | Status: DC

## 2022-03-03 MED ORDER — ATORVASTATIN CALCIUM 40 MG PO TABS: 40.0000 mg | ORAL_TABLET | Freq: Every day | ORAL | 1 refills | Status: DC

## 2022-03-03 NOTE — Progress Notes (Signed)
Name: Eric English.   MRN: [id number]    DOB: 04-30-1956   Date:03/03/2022       Progress Note  Subjective  Chief Complaint  Follow Up  HPI  HTN: he is compliant with medication, denies chest pain or palpitation. BP is at goal. He has intermittent lower extremity edema.    Hyperglycemia: hgbA1C as high as 6.4 % , discussed importance of life style modification. No polyphagia, polydipsia or polyuria   Perennial Allergic Rhinitis: he has a long history of allergies. He states in the early 2000's he had to start medication. He uses nasal steroids and anti-histamines.   OSA: he wears his CPAP every night , wakes up feeling rested , no headaches when he wakes up    Elevated PSA: he followed up by [facility] back in 2016, he was advised to follow up in one year, but lost to follow up. He was advised in 2019 to return for CPE with me but did not happen either. Last level of PSA back in 2021 and it was stable 2.5. Discussed need to repeat level.   From Urologist at [facility]: 01/2015  Elevated PSA, resolved; this was presumably related to an infection.  Will check a PSA today. Follow-up with me in 12 months to repeat PSA.  If PSA rises again, I will recommend MRI prior to another biopsy.    Morbid obesity: BMI above 35 with co-morbidities such as pre-diabetes, HTN and dyslipidemia. 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 He states pain is getting worse 9/10 worse when squatting. Discussed Tylenol and topical voltaren , advised to see Ortho. He lives in [place] and needs to be seen there He brought disability forms for the [place]. Explained even though a chronic pain we have not discussed this problem previously  Trigger finger on right hand: middle finger, going on for years, he does not want to have  surgery because he does not be off work, advised to discuss it with ortho also.   Diathesis recti: he is not interested in having surgery at this time , stable and chronic   Hyperlipidemia: taking atorvastatin, denies side effects of medication, last level was good at 35 , doing well   Left shoulder tendinitis : seen by [name] back in 2019 due to injury while in [place] in 2018. He still has pain on left shoulder but normal function, able to lift and work without restriction.   Patient Active Problem List   Diagnosis Date Noted   Internal derangement of knee 09/21/2020   Class 2 severe obesity due to excess calories with serious comorbidity and body mass index (BMI) of 37.0 to 37.9 in adult [facility]) 02/27/2020   Dyspnea on exertion 02/27/2020   Orthostatic dizziness 02/27/2020   History of elevated PSA 08/12/2019   Obstructive sleep apnea syndrome 08/12/2019   Mixed hyperlipidemia 03/18/2019   Prediabetes 08/02/2018   Chronic left shoulder pain 01/04/2018   Chronic seasonal allergic rhinitis due to pollen 09/19/2016   Hemorrhoids 07/15/2016   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   Elevated liver enzymes 10/08/2015   Screening for gout 07/11/2015   Superficial thrombophlebitis 06/23/2015   Diastasis recti 12/27/2013   Benign essential HTN 03/15/2010    Past Surgical History:  Procedure Laterality Date   COLONOSCOPY  2006   COLONOSCOPY WITH PROPOFOL N/A 08/15/2015   Procedure: COLONOSCOPY WITH PROPOFOL;  Surgeon: Robert Bellow, MD;  Location: [facility] ENDOSCOPY;  Service: Endoscopy;  Laterality: N/A;   HERNIA REPAIR  [id number]   umbilical   VASECTOMY  [id number]    Family History  Problem Relation Age of Onset   Diabetes Mother    Hypertension Mother    CAD Mother    Hyperlipidemia Mother    Heart disease Father    CAD Father    Diabetes Father     Social History   Tobacco Use   Smoking status: Former    Packs/day: 1.00    Years: 24.00    Total pack years:  24.00    Types: Cigarettes   Smokeless tobacco: Never  Substance Use Topics   Alcohol use: Yes    Alcohol/week: 0.0 standard drinks of alcohol    Comment: Occasionally     Current Outpatient Medications:    atorvastatin (LIPITOR) 40 MG tablet, Take 1 tablet by mouth once daily, Disp: 90 tablet, Rfl: 0   B Complex Vitamins (VITAMIN B COMPLEX PO), Take 1 tablet by mouth daily., Disp: , Rfl:    fluticasone (FLONASE) 50 MCG/ACT nasal spray, Place 2 sprays into both nostrils daily., Disp: 16 g, Rfl: 1   lisinopril-hydrochlorothiazide (ZESTORETIC) 20-25 MG tablet, Take 1 tablet by mouth daily., Disp: 90 tablet, Rfl: 3   loratadine (CLARITIN) 10 MG tablet, Take 1 tablet (10 mg total) by mouth daily., Disp: 90 tablet, Rfl: 3   Omega-3 Fatty Acids (FISH OIL) 1200 MG CAPS, Take 1 capsule by mouth 2 (two) times daily., Disp: , Rfl:   No Known Allergies  I personally reviewed active problem list, medication list, allergies, family history, social history, health maintenance with the patient/caregiver today.   ROS  Ten systems reviewed and is negative except as mentioned in HPI   Objective  Vitals:   03/03/22 0847  BP: 132/70  Pulse: 87  Resp: 16  SpO2: 95%  Weight: 263 lb (119.3 kg)  Height: '5\' 10"'$  (1.778 m)    Body mass index is 37.74 kg/m.  Physical Exam  Constitutional: Patient appears well-developed and well-nourished. Obese  No distress.  HEENT: head atraumatic, normocephalic, pupils equal and reactive to light, neck supple Cardiovascular: Normal rate, regular rhythm and normal heart sounds.  No murmur heard. No BLE edema. Pulmonary/Chest: Effort normal and breath sounds normal. No respiratory distress. Abdominal: Soft.  There is no tenderness. Muscular Skeletal: left knee effusion, crepitus with extension of left knee normal rom of left shoulder  Psychiatric: Patient has a normal mood and affect. behavior is normal. Judgment and thought content normal.    PHQ2/9:     03/03/2022    8:47 AM 07/01/2021    8:36 AM 09/21/2020   10:06 AM 02/27/2020    7:39 AM 08/12/2019   11:04 AM  Depression screen PHQ 2/9  Decreased Interest 1 1 0 0 0  Down, Depressed, Hopeless 0 1 0 0 0  PHQ - 2 Score 1 2 0 0 0  Altered sleeping 0 0   0  Tired, decreased energy 0 0   1  Change in appetite 0 0   0  Feeling bad or failure about yourself  0 0   0  Trouble concentrating 0 0   0  Moving slowly or fidgety/restless 0 0   0  Suicidal thoughts 0 0   0  PHQ-9 Score 1 2   1  Difficult doing work/chores  Not difficult at all   Not difficult at all    phq 9 is negative   Fall Risk:    03/03/2022    8:46 AM 07/01/2021    8:34 AM 09/21/2020   10:06 AM 02/27/2020    7:39 AM 08/12/2019   10:55 AM  Fall Risk   Falls in the past year? 0 0 0 0 0  Number falls in past yr: 0 0 0 0 0  Injury with Fall? 0 0 0 0 0  Risk for fall due to : No Fall Risks      Follow up Falls prevention discussed  Falls evaluation completed Falls evaluation completed       Functional Status Survey: Is the patient deaf or have difficulty hearing?: No Does the patient have difficulty seeing, even when wearing glasses/contacts?: No Does the patient have difficulty concentrating, remembering, or making decisions?: No Does the patient have difficulty walking or climbing stairs?: No Does the patient have difficulty dressing or bathing?: No Does the patient have difficulty doing errands alone such as visiting a doctor's office or shopping?: No    Assessment & Plan  1. Benign essential HTN  At goal   2. Morbid obesity ([place])  Discussed with the patient the risk posed by an increased BMI. Discussed importance of portion control, calorie counting and at least 150 minutes of physical activity weekly. Avoid sweet beverages and drink more water. Eat at least 6 servings of fruit and vegetables daily    3. Dyslipidemia  - atorvastatin (LIPITOR) 40 MG tablet; Take 1 tablet (40 mg total) by mouth daily.   Dispense: 90 tablet; Refill: 1  4. Perennial allergic rhinitis with seasonal variation   5. Need for immunization against influenza  - Flu Vaccine QUAD High Dose(Fluad)  6. Obstructive sleep apnea syndrome   7. Prediabetes  - Hemoglobin A1c  8. Chronic pain of both knees  - Ambulatory referral to Orthopedic Surgery  9. Chronic seasonal allergic rhinitis due to pollen  - fluticasone (FLONASE) 50 MCG/ACT nasal spray; Place 2 sprays into both nostrils daily.  Dispense: 16 g; Refill: 1  10. Elevated PSA  - PSA  11. Prostate cancer screening  - Hemoglobin A1c

## 2022-03-04 LAB — HEMOGLOBIN A1C
Hgb A1c MFr Bld: 6.4 % of total Hgb — ABNORMAL HIGH (ref <5.7)
Mean Plasma Glucose: 137 mg/dL
eAG (mmol/L): 7.6 mmol/L

## 2022-03-04 LAB — PSA: PSA: 2.63 ng/mL (ref <=4.00)

## 2022-03-11 DIAGNOSIS — S8392XA Sprain of unspecified site of left knee, initial encounter: Secondary | ICD-10-CM | POA: Diagnosis not present

## 2022-03-26 ENCOUNTER — Other Ambulatory Visit: Payer: Self-pay | Admitting: Family Medicine

## 2022-03-26 DIAGNOSIS — G8929 Other chronic pain: Secondary | ICD-10-CM

## 2022-04-01 ENCOUNTER — Ambulatory Visit: Payer: Medicare Other | Admitting: Family Medicine

## 2022-04-07 ENCOUNTER — Encounter: Payer: Self-pay | Admitting: Family Medicine

## 2022-04-07 ENCOUNTER — Other Ambulatory Visit: Payer: Self-pay | Admitting: Family Medicine

## 2022-04-07 DIAGNOSIS — G8929 Other chronic pain: Secondary | ICD-10-CM

## 2022-04-07 MED ORDER — MELOXICAM 15 MG PO TABS: 15.0000 mg | ORAL_TABLET | Freq: Every day | ORAL | 0 refills | Status: DC

## 2022-04-18 ENCOUNTER — Other Ambulatory Visit: Payer: Self-pay | Admitting: Family Medicine

## 2022-04-18 DIAGNOSIS — E785 Hyperlipidemia, unspecified: Secondary | ICD-10-CM

## 2022-04-21 ENCOUNTER — Ambulatory Visit: Payer: Medicare Other | Admitting: Internal Medicine

## 2022-05-02 ENCOUNTER — Ambulatory Visit: Payer: Medicare Other | Admitting: Family Medicine

## 2022-05-13 DIAGNOSIS — M2242 Chondromalacia patellae, left knee: Secondary | ICD-10-CM | POA: Diagnosis not present

## 2022-05-13 DIAGNOSIS — S8392XA Sprain of unspecified site of left knee, initial encounter: Secondary | ICD-10-CM | POA: Diagnosis not present

## 2022-05-13 DIAGNOSIS — Z6838 Body mass index (BMI) 38.0-38.9, adult: Secondary | ICD-10-CM | POA: Diagnosis not present

## 2022-05-13 DIAGNOSIS — M1612 Unilateral primary osteoarthritis, left hip: Secondary | ICD-10-CM | POA: Diagnosis not present

## 2022-05-23 ENCOUNTER — Ambulatory Visit: Payer: Medicare Other | Admitting: Family Medicine

## 2022-07-20 ENCOUNTER — Other Ambulatory Visit: Payer: Self-pay | Admitting: Family Medicine

## 2022-07-20 DIAGNOSIS — E785 Hyperlipidemia, unspecified: Secondary | ICD-10-CM

## 2022-07-21 MED ORDER — ATORVASTATIN CALCIUM 40 MG PO TABS: 40.0000 mg | ORAL_TABLET | Freq: Every day | ORAL | 0 refills | Status: DC

## 2022-07-25 ENCOUNTER — Ambulatory Visit: Payer: BLUE CROSS/BLUE SHIELD | Admitting: Cardiology

## 2022-07-30 ENCOUNTER — Encounter: Payer: Self-pay | Admitting: Family Medicine

## 2022-08-08 ENCOUNTER — Ambulatory Visit: Payer: Medicare Other

## 2022-08-15 ENCOUNTER — Ambulatory Visit: Payer: BLUE CROSS/BLUE SHIELD | Admitting: Cardiology

## 2022-08-25 ENCOUNTER — Encounter: Payer: Self-pay | Admitting: Family Medicine

## 2022-08-29 ENCOUNTER — Other Ambulatory Visit: Payer: Self-pay | Admitting: Emergency Medicine

## 2022-08-29 DIAGNOSIS — I10 Essential (primary) hypertension: Secondary | ICD-10-CM

## 2022-08-29 MED ORDER — LISINOPRIL-HYDROCHLOROTHIAZIDE 20-25 MG PO TABS: 1.0000 | ORAL_TABLET | Freq: Every day | ORAL | 0 refills | Status: DC

## 2022-08-29 NOTE — Telephone Encounter (Signed)
Appointment next week

## 2022-09-04 NOTE — Progress Notes (Signed)
Name: Eric English.   MRN: [id number]    DOB: 1955/06/27   Date:09/05/2022       Progress Note  Subjective  Chief Complaint  Medication Refill  HPI  HTN: he is compliant with medication, denies chest pain or palpitation. BP is at goal. He has intermittent lower extremity edema, now is worse on the left side due to knee problems No chest pain or palpitation    Hyperglycemia: hgbA1C as high as 6.4 % , discussed importance of life style modification. No polyphagia, polydipsia or polyuria . We will recheck labs , he is willing to take Metformin   Perennial Allergic Rhinitis: he has a long history of allergies. He states in the early 2000's he had to start medication. Worse this time of the year, only loratadine 10 mg , explained this time of the year may take it twice daily   OSA: he wears his CPAP every night , PAP Pressure: 5-20 cwp Ramp Pressure:, he is  wakes up feeling rested . Under the care of pulmonologist    Elevated PSA: he followed up by [facility] back in 2016, he was advised to follow up in one year, but lost to follow up. PSA done in 2023 was stable.  Morbid obesity: BMI above 35 with co-morbidities such as pre-diabetes, HTN and dyslipidemia.  His weight is up, he is mostly sitting at work. 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 He saw Ortho at [facility], cannot see notes. He was advised to strength his quad muscles and may need PT , he has an effusion on left knee, pain is a little better now   Diathesis recti: he is not interested in having surgery at this time , stable and chronic   Hyperlipidemia: taking atorvastatin, denies side effects of medication, last level was good at 35 , we will recheck labs today    Patient Active Problem List   Diagnosis Date Noted    Dyslipidemia 09/05/2022   Chronic pain of left knee 09/05/2022   Perennial allergic rhinitis with seasonal variation 03/03/2022   Internal derangement of knee 09/21/2020   Morbid obesity ([place]) 02/27/2020   History of elevated PSA 08/12/2019   OSA on CPAP 08/12/2019   Mixed hyperlipidemia 03/18/2019   Prediabetes 08/02/2018   Chronic left shoulder pain 01/04/2018   Chronic seasonal allergic rhinitis due to pollen 09/19/2016   Hemorrhoids 07/15/2016   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   Elevated liver enzymes 10/08/2015   Elevated PSA 07/11/2015   Superficial thrombophlebitis 06/23/2015   Diastasis recti 12/27/2013   Benign essential HTN 03/15/2010    Past Surgical History:  Procedure Laterality Date   COLONOSCOPY  2006   COLONOSCOPY WITH PROPOFOL N/A 08/15/2015   Procedure: COLONOSCOPY WITH PROPOFOL;  Surgeon: Robert Bellow, MD;  Location: [facility] ENDOSCOPY;  Service: Endoscopy;  Laterality: N/A;   HERNIA REPAIR  [id number]   umbilical   VASECTOMY  [id number]    Family History  Problem Relation Age of Onset   Diabetes Mother    Hypertension Mother    CAD Mother    Hyperlipidemia Mother    Heart disease Father    CAD Father    Diabetes Father     Social History   Tobacco Use  Smoking status: Former    Packs/day: 1.00    Years: 24.00    Additional pack years: 0.00    Total pack years: 24.00    Types: Cigarettes   Smokeless tobacco: Never  Substance Use Topics   Alcohol use: Yes    Alcohol/week: 0.0 standard drinks of alcohol    Comment: Occasionally     Current Outpatient Medications:    acetaminophen (TYLENOL) 500 MG tablet, Take 1 tablet (500 mg total) by mouth every 6 (six) hours as needed., Disp: 100 tablet, Rfl: 0   B Complex Vitamins (VITAMIN B COMPLEX PO), Take 1 tablet by mouth daily., Disp: , Rfl:    loratadine (CLARITIN) 10 MG tablet, Take 1 tablet (10 mg total) by mouth daily., Disp: 90 tablet, Rfl: 3   metFORMIN (GLUCOPHAGE-XR) 500 MG 24 hr  tablet, Take 1 tablet (500 mg total) by mouth daily with breakfast., Disp: 90 tablet, Rfl: 1   Omega-3 Fatty Acids (FISH OIL) 1200 MG CAPS, Take 1 capsule by mouth 2 (two) times daily., Disp: , Rfl:    atorvastatin (LIPITOR) 40 MG tablet, Take 1 tablet (40 mg total) by mouth daily., Disp: 90 tablet, Rfl: 1   lisinopril-hydrochlorothiazide (ZESTORETIC) 20-25 MG tablet, Take 1 tablet by mouth daily., Disp: 90 tablet, Rfl: 1  No Known Allergies  I personally reviewed active problem list, medication list, allergies, family history, social history, health maintenance with the patient/caregiver today.   ROS  Ten systems reviewed and is negative except as mentioned in HPI   Objective  Vitals:   09/05/22 0932  BP: 126/76  Pulse: 75  Resp: 16  Temp: 97.7 F (36.5 C)  TempSrc: Oral  SpO2: 97%  Weight: 270 lb (122.5 kg)  Height: 5\' 11"  (1.803 m)    Body mass index is 37.66 kg/m.  Physical Exam  Constitutional: Patient appears well-developed and well-nourished. Obese  No distress.  HEENT: head atraumatic, normocephalic, pupils equal and reactive to light, neck supple Cardiovascular: Normal rate, regular rhythm and normal heart sounds.  No murmur heard. No BLE edema. Pulmonary/Chest: Effort normal and breath sounds normal. No respiratory distress. Abdominal: Soft.  There is no tenderness. Muscular skeletal: effusion of left knee Psychiatric: Patient has a normal mood and affect. behavior is normal. Judgment and thought content normal.   PHQ2/9:    09/05/2022    9:42 AM 03/03/2022    8:47 AM 07/01/2021    8:36 AM 09/21/2020   10:06 AM 02/27/2020    7:39 AM  Depression screen PHQ 2/9  Decreased Interest 0 1 1 0 0  Down, Depressed, Hopeless 0 0 1 0 0  PHQ - 2 Score 0 1 2 0 0  Altered sleeping 0 0 0    Tired, decreased energy 0 0 0    Change in appetite 0 0 0    Feeling bad or failure about yourself  0 0 0    Trouble concentrating 0 0 0    Moving slowly or fidgety/restless 0 0 0     Suicidal thoughts 0 0 0    PHQ-9 Score 0 1 2    Difficult doing work/chores   Not difficult at all      phq 9 is negative   Fall Risk:    09/05/2022    9:42 AM 03/03/2022    8:46 AM 07/01/2021    8:34 AM 09/21/2020   10:06 AM 02/27/2020    7:39 AM  Fall Risk   Falls in the past year?  0 0 0 0 0  Number falls in past yr:  0 0 0 0  Injury with Fall?  0 0 0 0  Risk for fall due to : No Fall Risks No Fall Risks     Follow up Falls prevention discussed;Education provided;Falls evaluation completed Falls prevention discussed  Falls evaluation completed Falls evaluation completed    Assessment & Plan  1. Benign essential HTN  - CBC with Differential/Platelet - COMPLETE METABOLIC PANEL WITH GFR - lisinopril-hydrochlorothiazide (ZESTORETIC) 20-25 MG tablet; Take 1 tablet by mouth daily.  Dispense: 90 tablet; Refill: 1  2. Morbid obesity ([place])  Discussed with the patient the risk posed by an increased BMI. Discussed importance of portion control, calorie counting and at least 150 minutes of physical activity weekly. Avoid sweet beverages and drink more water. Eat at least 6 servings of fruit and vegetables daily    3. OSA on CPAP  Compliant   4. Perennial allergic rhinitis with seasonal variation  May increase loratadine twice daily   5. Prediabetes  - Hemoglobin A1c  6. Dyslipidemia  - Lipid panel - atorvastatin (LIPITOR) 40 MG tablet; Take 1 tablet (40 mg total) by mouth daily.  Dispense: 90 tablet; Refill: 1  7. Elevated PSA   8. Chronic pain of left knee  - acetaminophen (TYLENOL) 500 MG tablet; Take 1 tablet (500 mg total) by mouth every 6 (six) hours as needed.  Dispense: 100 tablet; Refill: 0

## 2022-09-05 ENCOUNTER — Encounter: Payer: Self-pay | Admitting: Family Medicine

## 2022-09-05 ENCOUNTER — Ambulatory Visit (INDEPENDENT_AMBULATORY_CARE_PROVIDER_SITE_OTHER): Payer: BLUE CROSS/BLUE SHIELD | Admitting: Family Medicine

## 2022-09-05 DIAGNOSIS — I10 Essential (primary) hypertension: Secondary | ICD-10-CM | POA: Diagnosis not present

## 2022-09-05 DIAGNOSIS — J3089 Other allergic rhinitis: Secondary | ICD-10-CM | POA: Diagnosis not present

## 2022-09-05 DIAGNOSIS — R972 Elevated prostate specific antigen [PSA]: Secondary | ICD-10-CM

## 2022-09-05 DIAGNOSIS — E785 Hyperlipidemia, unspecified: Secondary | ICD-10-CM

## 2022-09-05 DIAGNOSIS — J302 Other seasonal allergic rhinitis: Secondary | ICD-10-CM

## 2022-09-05 DIAGNOSIS — G8929 Other chronic pain: Secondary | ICD-10-CM

## 2022-09-05 DIAGNOSIS — M25562 Pain in left knee: Secondary | ICD-10-CM

## 2022-09-05 DIAGNOSIS — R7303 Prediabetes: Secondary | ICD-10-CM

## 2022-09-05 DIAGNOSIS — G4733 Obstructive sleep apnea (adult) (pediatric): Secondary | ICD-10-CM

## 2022-09-05 MED ORDER — METFORMIN HCL ER 500 MG PO TB24: 500.0000 mg | ORAL_TABLET | Freq: Every day | ORAL | 1 refills | Status: DC

## 2022-09-05 MED ORDER — ACETAMINOPHEN 500 MG PO TABS: 500.0000 mg | ORAL_TABLET | Freq: Four times a day (QID) | ORAL | 0 refills | Status: AC | PRN

## 2022-09-05 MED ORDER — LISINOPRIL-HYDROCHLOROTHIAZIDE 20-25 MG PO TABS: 1.0000 | ORAL_TABLET | Freq: Every day | ORAL | 1 refills | Status: DC

## 2022-09-05 MED ORDER — ATORVASTATIN CALCIUM 40 MG PO TABS: 40.0000 mg | ORAL_TABLET | Freq: Every day | ORAL | 1 refills | Status: DC

## 2022-09-06 LAB — LIPID PANEL
Cholesterol: 84 mg/dL (ref <200)
HDL: 37 mg/dL — ABNORMAL LOW (ref >=40)
LDL Cholesterol (Calc): 33 mg/dL (calc)
Non-HDL Cholesterol (Calc): 47 mg/dL (calc) (ref <130)
Total CHOL/HDL Ratio: 2.3 (calc) (ref <5.0)
Triglycerides: 67 mg/dL (ref <150)

## 2022-09-06 LAB — CBC WITH DIFFERENTIAL/PLATELET
Absolute Monocytes: 731 cells/uL (ref 200–950)
Basophils Absolute: 19 cells/uL (ref 0–200)
Basophils Relative: 0.3 %
Eosinophils Absolute: 50 cells/uL (ref 15–500)
Eosinophils Relative: 0.8 %
HCT: 40 % (ref 38.5–50.0)
Hemoglobin: 13.3 g/dL (ref 13.2–17.1)
Lymphs Abs: 1915 cells/uL (ref 850–3900)
MCH: 29.5 pg (ref 27.0–33.0)
MCHC: 33.3 g/dL (ref 32.0–36.0)
MCV: 88.7 fL (ref 80.0–100.0)
MPV: 11.5 fL (ref 7.5–12.5)
Monocytes Relative: 11.6 %
Neutro Abs: 3585 cells/uL (ref 1500–7800)
Neutrophils Relative %: 56.9 %
Platelets: 232 10*3/uL (ref 140–400)
RBC: 4.51 10*6/uL (ref 4.20–5.80)
RDW: 13.1 % (ref 11.0–15.0)
Total Lymphocyte: 30.4 %
WBC: 6.3 10*3/uL (ref 3.8–10.8)

## 2022-09-06 LAB — COMPLETE METABOLIC PANEL WITH GFR
AG Ratio: 1 (calc) (ref 1.0–2.5)
ALT: 50 U/L — ABNORMAL HIGH (ref 9–46)
AST: 29 U/L (ref 10–35)
Albumin: 4.1 g/dL (ref 3.6–5.1)
Alkaline phosphatase (APISO): 68 U/L (ref 35–144)
BUN: 14 mg/dL (ref 7–25)
CO2: 28 mmol/L (ref 20–32)
Calcium: 10 mg/dL (ref 8.6–10.3)
Chloride: 103 mmol/L (ref 98–110)
Creat: 0.94 mg/dL (ref 0.70–1.35)
Globulin: 4 g/dL (calc) — ABNORMAL HIGH (ref 1.9–3.7)
Glucose, Bld: 99 mg/dL (ref 65–99)
Potassium: 4.1 mmol/L (ref 3.5–5.3)
Sodium: 139 mmol/L (ref 135–146)
Total Bilirubin: 0.3 mg/dL (ref 0.2–1.2)
Total Protein: 8.1 g/dL (ref 6.1–8.1)
eGFR: 88 mL/min/{1.73_m2} (ref >=60)

## 2022-09-06 LAB — HEMOGLOBIN A1C
Hgb A1c MFr Bld: 6.6 % of total Hgb — ABNORMAL HIGH (ref <5.7)
Mean Plasma Glucose: 143 mg/dL
eAG (mmol/L): 7.9 mmol/L

## 2022-09-08 ENCOUNTER — Encounter: Payer: Self-pay | Admitting: Family Medicine

## 2022-09-11 ENCOUNTER — Telehealth: Payer: Self-pay

## 2022-09-11 ENCOUNTER — Ambulatory Visit: Payer: Medicare Other

## 2022-09-11 NOTE — Telephone Encounter (Signed)
09/11/2022 01:00 PM EDT by Roger Shelter, LPN  Eric English, Eric English. (Self) [phone] (Mobile) Remove  Left Message - called pt to do AWV:pt personal msg presents but mailbox was full (unable to leave a msg).  09/11/2022 01:07 PM EDT by Roger Shelter, LPN  Eric English, Eric English (EC) [phone] (Mobile) Remove  Completed - i spoke with pt's wife whi says pt forgot appt and asked for me to r/s it to a friday afternoon.Marland Kitchenappt r/s'd to Friday 10/03/22@1 :30pm

## 2022-10-03 ENCOUNTER — Ambulatory Visit (INDEPENDENT_AMBULATORY_CARE_PROVIDER_SITE_OTHER): Payer: BLUE CROSS/BLUE SHIELD

## 2022-10-03 VITALS — Ht 71.0 in | Wt 270.0 lb

## 2022-10-03 DIAGNOSIS — Z0000 Encounter for general adult medical examination without abnormal findings: Secondary | ICD-10-CM | POA: Diagnosis not present

## 2022-10-03 NOTE — Patient Instructions (Signed)
Eric English , Thank you for taking time to come for your Medicare Wellness Visit. I appreciate your ongoing commitment to your health goals. Please review the following plan we discussed and let me know if I can assist you in the future.   These are the goals we discussed:  Goals   None     This is a list of the screening recommended for you and due dates:  Health Maintenance  Topic Date Due   Complete foot exam   Never done   Eye exam for diabetics  Never done   Yearly kidney health urinalysis for diabetes  02/26/2021   Flu Shot  01/15/2023   Hemoglobin A1C  03/08/2023   Yearly kidney function blood test for diabetes  09/05/2023   Medicare Annual Wellness Visit  10/03/2023   Colon Cancer Screening  08/14/2025   Pneumonia Vaccine  Completed   Hepatitis C Screening: USPSTF Recommendation to screen - Ages 18-79 yo.  Completed   Zoster (Shingles) Vaccine  Completed   HPV Vaccine  Aged Out   DTaP/Tdap/Td vaccine  Discontinued   COVID-19 Vaccine  Discontinued    Advanced directives: yes  Conditions/risks identified: none  Next appointment: Follow up in one year for your annual wellness visit. 10/09/2023 @ 1:30pm telephone  Preventive Care 65 Years and Older, Male  Preventive care refers to lifestyle choices and visits with your health care provider that can promote health and wellness. What does preventive care include? A yearly physical exam. This is also called an annual well check. Dental exams once or twice a year. Routine eye exams. Ask your health care provider how often you should have your eyes checked. Personal lifestyle choices, including: Daily care of your teeth and gums. Regular physical activity. Eating a healthy diet. Avoiding tobacco and drug use. Limiting alcohol use. Practicing safe sex. Taking low doses of aspirin every day. Taking vitamin and mineral supplements as recommended by your health care provider. What happens during an annual well check? The  services and screenings done by your health care provider during your annual well check will depend on your age, overall health, lifestyle risk factors, and family history of disease. Counseling  Your health care provider may ask you questions about your: Alcohol use. Tobacco use. Drug use. Emotional well-being. Home and relationship well-being. Sexual activity. Eating habits. History of falls. Memory and ability to understand (cognition). Work and work Astronomer. Screening  You may have the following tests or measurements: Height, weight, and BMI. Blood pressure. Lipid and cholesterol levels. These may be checked every 5 years, or more frequently if you are over 21 years old. Skin check. Lung cancer screening. You may have this screening every year starting at age 82 if you have a 30-pack-year history of smoking and currently smoke or have quit within the past 15 years. Fecal occult blood test (FOBT) of the stool. You may have this test every year starting at age 63. Flexible sigmoidoscopy or colonoscopy. You may have a sigmoidoscopy every 5 years or a colonoscopy every 10 years starting at age 8. Prostate cancer screening. Recommendations will vary depending on your family history and other risks. Hepatitis C blood test. Hepatitis B blood test. Sexually transmitted disease (STD) testing. Diabetes screening. This is done by checking your blood sugar (glucose) after you have not eaten for a while (fasting). You may have this done every 1-3 years. Abdominal aortic aneurysm (AAA) screening. You may need this if you are a current or former smoker.  Osteoporosis. You may be screened starting at age 28 if you are at high risk. Talk with your health care provider about your test results, treatment options, and if necessary, the need for more tests. Vaccines  Your health care provider may recommend certain vaccines, such as: Influenza vaccine. This is recommended every year. Tetanus,  diphtheria, and acellular pertussis (Tdap, Td) vaccine. You may need a Td booster every 10 years. Zoster vaccine. You may need this after age 58. Pneumococcal 13-valent conjugate (PCV13) vaccine. One dose is recommended after age 42. Pneumococcal polysaccharide (PPSV23) vaccine. One dose is recommended after age 34. Talk to your health care provider about which screenings and vaccines you need and how often you need them. This information is not intended to replace advice given to you by your health care provider. Make sure you discuss any questions you have with your health care provider. Document Released: 06/29/2015 Document Revised: 02/20/2016 Document Reviewed: 04/03/2015 Elsevier Interactive Patient Education  2017 [employer].  Fall Prevention in the Home Falls can cause injuries. They can happen to people of all ages. There are many things you can do to make your home safe and to help prevent falls. What can I do on the outside of my home? Regularly fix the edges of walkways and driveways and fix any cracks. Remove anything that might make you trip as you walk through a door, such as a raised step or threshold. Trim any bushes or trees on the path to your home. Use bright outdoor lighting. Clear any walking paths of anything that might make someone trip, such as rocks or tools. Regularly check to see if handrails are loose or broken. Make sure that both sides of any steps have handrails. Any raised decks and porches should have guardrails on the edges. Have any leaves, snow, or ice cleared regularly. Use sand or salt on walking paths during winter. Clean up any spills in your garage right away. This includes oil or grease spills. What can I do in the bathroom? Use night lights. Install grab bars by the toilet and in the tub and shower. Do not use towel bars as grab bars. Use non-skid mats or decals in the tub or shower. If you need to sit down in the shower, use a plastic,  non-slip stool. Keep the floor dry. Clean up any water that spills on the floor as soon as it happens. Remove soap buildup in the tub or shower regularly. Attach bath mats securely with double-sided non-slip rug tape. Do not have throw rugs and other things on the floor that can make you trip. What can I do in the bedroom? Use night lights. Make sure that you have a light by your bed that is easy to reach. Do not use any sheets or blankets that are too big for your bed. They should not hang down onto the floor. Have a firm chair that has side arms. You can use this for support while you get dressed. Do not have throw rugs and other things on the floor that can make you trip. What can I do in the kitchen? Clean up any spills right away. Avoid walking on wet floors. Keep items that you use a lot in easy-to-reach places. If you need to reach something above you, use a strong step stool that has a grab bar. Keep electrical cords out of the way. Do not use floor polish or wax that makes floors slippery. If you must use wax, use non-skid floor  wax. Do not have throw rugs and other things on the floor that can make you trip. What can I do with my stairs? Do not leave any items on the stairs. Make sure that there are handrails on both sides of the stairs and use them. Fix handrails that are broken or loose. Make sure that handrails are as long as the stairways. Check any carpeting to make sure that it is firmly attached to the stairs. Fix any carpet that is loose or worn. Avoid having throw rugs at the top or bottom of the stairs. If you do have throw rugs, attach them to the floor with carpet tape. Make sure that you have a light switch at the top of the stairs and the bottom of the stairs. If you do not have them, ask someone to add them for you. What else can I do to help prevent falls? Wear shoes that: Do not have high heels. Have rubber bottoms. Are comfortable and fit you well. Are closed  at the toe. Do not wear sandals. If you use a stepladder: Make sure that it is fully opened. Do not climb a closed stepladder. Make sure that both sides of the stepladder are locked into place. Ask someone to hold it for you, if possible. Clearly mark and make sure that you can see: Any grab bars or handrails. First and last steps. Where the edge of each step is. Use tools that help you move around (mobility aids) if they are needed. These include: Canes. Walkers. Scooters. Crutches. Turn on the lights when you go into a dark area. Replace any light bulbs as soon as they burn out. Set up your furniture so you have a clear path. Avoid moving your furniture around. If any of your floors are uneven, fix them. If there are any pets around you, be aware of where they are. Review your medicines with your doctor. Some medicines can make you feel dizzy. This can increase your chance of falling. Ask your doctor what other things that you can do to help prevent falls. This information is not intended to replace advice given to you by your health care provider. Make sure you discuss any questions you have with your health care provider. Document Released: 03/29/2009 Document Revised: 11/08/2015 Document Reviewed: 07/07/2014 Elsevier Interactive Patient Education  2017 [employer].

## 2022-10-03 NOTE — Progress Notes (Signed)
I connected with  Otelia Limes. on 10/03/22 by a audio enabled telemedicine application and verified that I am speaking with the correct person using two identifiers.  Patient Location: Home  Provider Location: Office/Clinic  I discussed the limitations of evaluation and management by telemedicine. The patient expressed understanding and agreed to proceed.  Subjective:   Eric English. is a 67 y.o. male who presents for Medicare Annual/Subsequent preventive examination.  Review of Systems    Cardiac Risk Factors include: advanced age (>27men, >34 women);diabetes mellitus;dyslipidemia;hypertension;male gender;obesity (BMI >30kg/m2)    Objective:    Today's Vitals   10/03/22 1331  Weight: 270 lb (122.5 kg)  Height: 5\' 11"  (1.803 m)  PainSc: 4    Body mass index is 37.66 kg/m.     10/03/2022    1:36 PM 02/17/2021    3:26 PM 01/09/2017   10:58 AM 09/19/2016    8:49 AM 07/15/2016   10:07 AM 06/18/2016    2:27 PM 12/04/2015   10:50 AM  Advanced Directives  Does Patient Have a Medical Advance Directive? Yes No No No No No No  Type of Advance Directive Healthcare Power of Attorney        Would patient like information on creating a medical advance directive?       No - patient declined information    Current Medications (verified) Outpatient Encounter Medications as of 10/03/2022  Medication Sig   acetaminophen (TYLENOL) 500 MG tablet Take 1 tablet (500 mg total) by mouth every 6 (six) hours as needed.   atorvastatin (LIPITOR) 40 MG tablet Take 1 tablet (40 mg total) by mouth daily.   B Complex Vitamins (VITAMIN B COMPLEX PO) Take 1 tablet by mouth daily.   lisinopril-hydrochlorothiazide (ZESTORETIC) 20-25 MG tablet Take 1 tablet by mouth daily.   loratadine (CLARITIN) 10 MG tablet Take 1 tablet (10 mg total) by mouth daily.   metFORMIN (GLUCOPHAGE-XR) 500 MG 24 hr tablet Take 1 tablet (500 mg total) by mouth daily with breakfast.   Omega-3 Fatty Acids (FISH OIL) 1200 MG CAPS  Take 1 capsule by mouth 2 (two) times daily.   No facility-administered encounter medications on file as of 10/03/2022.    Allergies (verified) Patient has no known allergies.   History: Past Medical History:  Diagnosis Date   Abnormal glucose    Allergy    pollen   Arthritis    hands / knees   Hemorrhoids    Hypertension    Prostate enlargement    Sleep apnea    Trigger finger    Past Surgical History:  Procedure Laterality Date   COLONOSCOPY  06/16/2004   COLONOSCOPY WITH PROPOFOL N/A 08/15/2015   Procedure: COLONOSCOPY WITH PROPOFOL;  Surgeon: Earline Mayotte, MD;  Location: [facility] ENDOSCOPY;  Service: Endoscopy;  Laterality: N/A;   HERNIA REPAIR  06/17/2007   umbilical   VASECTOMY  06/16/1980   Family History  Problem Relation Age of Onset   Diabetes Mother    Hypertension Mother    CAD Mother    Hyperlipidemia Mother    Heart disease Father    CAD Father    Diabetes Father    Alcohol abuse Father    Hypertension Sister    Scleroderma Sister    Alcohol abuse Brother    Social History   Socioeconomic History   Marital status: Married    Spouse name: Francena Hanly    Number of children: 3   Years of education: Not on file  Highest education level: Bachelor's degree (e.g., BA, AB, BS)  Occupational History    Employer: [employer]  Tobacco Use   Smoking status: Former    Packs/day: 1.00    Years: 24.00    Additional pack years: 0.00    Total pack years: 24.00    Types: Cigarettes    Quit date: 09/14/1997    Years since quitting: 25.0   Smokeless tobacco: Never  Substance and Sexual Activity   Alcohol use: Yes    Alcohol/week: 2.0 - 3.0 standard drinks of alcohol    Types: 1 Glasses of wine, 1 - 2 Cans of beer per week    Comment: Occasionally   Drug use: No   Sexual activity: Yes    Birth control/protection: None  Other Topics Concern   Not on file  Social History Narrative   Married, wife and children live in [place], he works in [place] , retired  from [employer] and also from city government of [place]   Social Determinants of Health   Financial Resource Strain: Low Risk  (10/03/2022)   Overall Financial Resource Strain (CARDIA)    Difficulty of Paying Living Expenses: Not hard at all  Food Insecurity: No Food Insecurity (10/03/2022)   Hunger Vital Sign    Worried About Running Out of Food in the Last Year: Never true    Ran Out of Food in the Last Year: Never true  Transportation Needs: No Transportation Needs (10/03/2022)   PRAPARE - Administrator, Civil Service (Medical): No    Lack of Transportation (Non-Medical): No  Physical Activity: Sufficiently Active (10/03/2022)   Exercise Vital Sign    Days of Exercise per Week: 7 days    Minutes of Exercise per Session: 60 min  Stress: No Stress Concern Present (10/03/2022)   [employer] of Occupational Health - Occupational Stress Questionnaire    Feeling of Stress : Not at all  Social Connections: Socially Integrated (10/03/2022)   Social Connection and Isolation Panel [NHANES]    Frequency of Communication with Friends and Family: More than three times a week    Frequency of Social Gatherings with Friends and Family: Once a week    Attends Religious Services: More than 4 times per year    Active Member of [employer] or Organizations: Yes    Attends Banker Meetings: Never    Marital Status: Married    Tobacco Counseling Counseling given: Not Answered   Clinical Intake:  Pre-visit preparation completed: Yes  Pain Score: 4      BMI - recorded: 37.66 Nutritional Status: BMI > 30  Obese Nutritional Risks: None Diabetes: Yes CBG done?: No Did pt. bring in CBG monitor from home?: No  How often do you need to have someone help you when you read instructions, pamphlets, or other written materials from your doctor or pharmacy?: 1 - Never  Diabetic?yes  Interpreter Needed?: No  Information entered by :: B.Sumedha Munnerlyn,LPN   Activities of Daily  Living    10/03/2022    1:42 PM 09/05/2022    9:42 AM  In your present state of health, do you have any difficulty performing the following activities:  Hearing? 0 0  Vision? 0 0  Difficulty concentrating or making decisions? 0 0  Walking or climbing stairs? 0 0  Dressing or bathing? 0 0  Doing errands, shopping? 0 0  Preparing Food and eating ? N   Using the Toilet? N   In the past six months,  have you accidently leaked urine? N   Do you have problems with loss of bowel control? N   Managing your Medications? N   Managing your Finances? N   Housekeeping or managing your Housekeeping? N     Patient Care Team: Alba Cory, MD as PCP - General (Family Medicine) Debbe Odea, MD as PCP - Cardiology (Cardiology) Dennison Mascot, MD (Family Medicine) Lemar Livings Merrily Pew, MD (General Surgery)  Indicate any recent Medical Services you may have received from other than [facility] providers in the past year (date may be approximate).     Assessment:   This is a routine wellness examination for Javione.  Hearing/Vision screen Hearing Screening - Comments:: Adequate hearing Vision Screening - Comments:: Adequate vision w/glasses Dr Senaida Ores  Dietary issues and exercise activities discussed: Current Exercise Habits: Home exercise routine;Structured exercise class, Type of exercise: exercise ball;strength training/weights;stretching;treadmill;walking;calisthenics, Time (Minutes): 60, Frequency (Times/Week): 7, Weekly Exercise (Minutes/Week): 420, Intensity: Mild, Exercise limited by: None identified   Goals Addressed   None    Depression Screen    10/03/2022    1:40 PM 09/05/2022    9:42 AM 03/03/2022    8:47 AM 07/01/2021    8:36 AM 09/21/2020   10:06 AM 02/27/2020    7:39 AM 08/12/2019   11:04 AM  PHQ 2/9 Scores  PHQ - 2 Score 0 0 1 2 0 0 0  PHQ- 9 Score 0 0 Fall Risk    10/03/2022    1:33 PM 09/05/2022    9:42 AM 03/03/2022    8:46 AM 07/01/2021    8:34 AM  09/21/2020   10:06 AM  Fall Risk   Falls in the past year? 0 0 0 0 0  Number falls in past yr: 0  0 0 0  Injury with Fall? 0  0 0 0  Risk for fall due to : No Fall Risks No Fall Risks No Fall Risks    Follow up Education provided;Falls prevention discussed Falls prevention discussed;Education provided;Falls evaluation completed Falls prevention discussed  Falls evaluation completed    FALL RISK PREVENTION PERTAINING TO THE HOME:  Any stairs in or around the home? Yes  If so, are there any without handrails? Yes  Home free of loose throw rugs in walkways, pet beds, electrical cords, etc? Yes  Adequate lighting in your home to reduce risk of falls? Yes   ASSISTIVE DEVICES UTILIZED TO PREVENT FALLS:  Life alert? No  Use of a cane, walker or w/c? No  Grab bars in the bathroom? Yes  Shower chair or bench in shower? Yes  Elevated toilet seat or a handicapped toilet? Yes     Cognitive Function:        10/03/2022    1:44 PM  6CIT Screen  What Year? 0 points  What month? 0 points  What time? 0 points  Count back from 20 0 points  Months in reverse 0 points  Repeat phrase 0 points  Total Score 0 points    Immunizations Immunization History  Administered Date(s) Administered   Fluad Quad(high Dose 65+) 02/27/2020, 03/03/2022   Influenza,inj,Quad PF,6+ Mos 03/21/2014, 03/13/2015, 06/18/2016, 05/14/2018, 03/17/2019   Pneumococcal Conjugate-13 09/21/2020   Pneumococcal Polysaccharide-23 08/12/2019   Tdap 05/31/2012   Zoster Recombinat (Shingrix) 09/21/2020, 12/03/2020    TDAP status: Up to date  Flu Vaccine status: Up to date  Pneumococcal vaccine status: Up to date  Covid-19 vaccine status: Declined, Education has been provided  regarding the importance of this vaccine but patient still declined. Advised may receive this vaccine at local pharmacy or Health Dept.or vaccine clinic. Aware to provide a copy of the vaccination record if obtained from local pharmacy or Health  Dept. Verbalized acceptance and understanding.  Qualifies for Shingles Vaccine? Yes   Zostavax completed Yes   Shingrix Completed?: Yes  Screening Tests Health Maintenance  Topic Date Due   FOOT EXAM  Never done   OPHTHALMOLOGY EXAM  Never done   Diabetic kidney evaluation - Urine ACR  02/26/2021   INFLUENZA VACCINE  01/15/2023   HEMOGLOBIN A1C  03/08/2023   Diabetic kidney evaluation - eGFR measurement  09/05/2023   Medicare Annual Wellness (AWV)  10/03/2023   COLONOSCOPY (Pts 45-40yrs Insurance coverage will need to be confirmed)  08/14/2025   Pneumonia Vaccine 29+ Years old  Completed   Hepatitis C Screening  Completed   Zoster Vaccines- Shingrix  Completed   HPV VACCINES  Aged Out   DTaP/Tdap/Td  Discontinued   COVID-19 Vaccine  Discontinued    Health Maintenance  Health Maintenance Due  Topic Date Due   FOOT EXAM  Never done   OPHTHALMOLOGY EXAM  Never done   Diabetic kidney evaluation - Urine ACR  02/26/2021    Colorectal cancer screening: Type of screening: Colonoscopy. Completed yes. Repeat every 10 years  Lung Cancer Screening: (Low Dose CT Chest recommended if Age 71-80 years, 30 pack-year currently smoking OR have quit w/in 15years.) does not qualify.   Lung Cancer Screening Referral: no  Additional Screening:  Hepatitis C Screening: does not qualify; Completed yes  Vision Screening: Recommended annual ophthalmology exams for early detection of glaucoma and other disorders of the eye. Is the patient up to date with their annual eye exam?  Yes  Who is the provider or what is the name of the office in which the patient attends annual eye exams? Dr Senaida Ores If pt is not established with a provider, would they like to be referred to a provider to establish care? No .   Dental Screening: Recommended annual dental exams for proper oral hygiene  Community Resource Referral / Chronic Care Management: CRR required this visit?  No   CCM required this visit?   No      Plan:     I have personally reviewed and noted the following in the patient's chart:   Medical and social history Use of alcohol, tobacco or illicit drugs  Current medications and supplements including opioid prescriptions. Patient is not currently taking opioid prescriptions. Functional ability and status Nutritional status Physical activity Advanced directives List of other physicians Hospitalizations, surgeries, and ER visits in previous 12 months Vitals Screenings to include cognitive, depression, and falls Referrals and appointments  In addition, I have reviewed and discussed with patient certain preventive protocols, quality metrics, and best practice recommendations. A written personalized care plan for preventive services as well as general preventive health recommendations were provided to patient.     Sue Lush, LPN   1/61/0960   Nurse Notes: The patient states he is doing well and has no concerns or questions at this time.

## 2022-10-10 ENCOUNTER — Ambulatory Visit: Payer: BLUE CROSS/BLUE SHIELD | Attending: Cardiology | Admitting: Cardiology

## 2022-10-10 ENCOUNTER — Encounter: Payer: Self-pay | Admitting: Cardiology

## 2022-10-10 VITALS — BP 134/70 | HR 74 | Ht 71.0 in | Wt 269.2 lb

## 2022-10-10 DIAGNOSIS — Z6837 Body mass index (BMI) 37.0-37.9, adult: Secondary | ICD-10-CM

## 2022-10-10 DIAGNOSIS — I10 Essential (primary) hypertension: Secondary | ICD-10-CM | POA: Diagnosis not present

## 2022-10-10 DIAGNOSIS — E7800 Pure hypercholesterolemia, unspecified: Secondary | ICD-10-CM

## 2022-10-10 DIAGNOSIS — I2584 Coronary atherosclerosis due to calcified coronary lesion: Secondary | ICD-10-CM

## 2022-10-10 DIAGNOSIS — I2510 Atherosclerotic heart disease of native coronary artery without angina pectoris: Secondary | ICD-10-CM | POA: Diagnosis not present

## 2022-10-10 NOTE — Patient Instructions (Signed)
Medication Instructions:  - Your physician recommends that you continue on your current medications as directed. Please refer to the Current Medication list given to you today.  *If you need a refill on your cardiac medications before your next appointment, please call your pharmacy*   Lab Work: - none ordered  If you have labs (blood work) drawn today and your tests are completely normal, you will receive your results only by: MyChart Message (if you have MyChart) OR A paper copy in the mail If you have any lab test that is abnormal or we need to change your treatment, we will call you to review the results.   Testing/Procedures: - none ordered   Follow-Up: At [facility], you and your health needs are our priority.  As part of our continuing mission to provide you with exceptional heart care, we have created designated Provider Care Teams.  These Care Teams include your primary Cardiologist (physician) and Advanced Practice Providers (APPs -  Physician Assistants and Nurse Practitioners) who all work together to provide you with the care you need, when you need it.  We recommend signing up for the patient portal called "MyChart".  Sign up information is provided on this After Visit Summary.  MyChart is used to connect with patients for Virtual Visits (Telemedicine).  Patients are able to view lab/test results, encounter notes, upcoming appointments, etc.  Non-urgent messages can be sent to your provider as well.   To learn more about what you can do with MyChart, go to [email or web address].    Your next appointment:   As needed   Provider:   Debbe Odea, MD    Other Instructions N/a

## 2022-10-10 NOTE — Progress Notes (Signed)
Cardiology Office Note:    Date:  10/10/2022   ID:  Eric English., DOB 05-Oct-1955, MRN [id number]  PCP:  Alba Cory, MD   [facility] Providers Cardiologist:  Debbe Odea, MD     Referring MD: Alba Cory, MD   Chief Complaint  Patient presents with   Follow-up    Patient denies new or acute cardiac problems/concerns today.      History of Present Illness:    Eric English. is a 67 y.o. male with a hx of coronary artery calcification, hypertension, hyperlipidemia, former smoker who presents for follow-up.  Being seen due to coronary artery calcification and Hypertension.  Compliant with medications as prescribed, blood pressure is adequately controlled.  Cholesterol checked last month which was adequately controlled.  Has left hip pain for which she sees orthopedics.  Leg exercises advised.  Prior notes Echocardiogram 07/2020 normal systolic function, grade 2 diastolic dysfunction. Coronary CTA 07/2020 calcium score 6.1, minimal nonobstructive mid LAD disease.  Past Medical History:  Diagnosis Date   Abnormal glucose    Allergy    pollen   Arthritis    hands / knees   Hemorrhoids    Hypertension    Prostate enlargement    Sleep apnea    Trigger finger     Past Surgical History:  Procedure Laterality Date   COLONOSCOPY  06/16/2004   COLONOSCOPY WITH PROPOFOL N/A 08/15/2015   Procedure: COLONOSCOPY WITH PROPOFOL;  Surgeon: Earline Mayotte, MD;  Location: [facility] ENDOSCOPY;  Service: Endoscopy;  Laterality: N/A;   HERNIA REPAIR  06/17/2007   umbilical   VASECTOMY  06/16/1980    Current Medications: Current Meds  Medication Sig   acetaminophen (TYLENOL) 500 MG tablet Take 1 tablet (500 mg total) by mouth every 6 (six) hours as needed.   atorvastatin (LIPITOR) 40 MG tablet Take 1 tablet (40 mg total) by mouth daily.   B Complex Vitamins (VITAMIN B COMPLEX PO) Take 1 tablet by mouth daily.   lisinopril-hydrochlorothiazide (ZESTORETIC)  20-25 MG tablet Take 1 tablet by mouth daily.   loratadine (CLARITIN) 10 MG tablet Take 1 tablet (10 mg total) by mouth daily.   metFORMIN (GLUCOPHAGE-XR) 500 MG 24 hr tablet Take 1 tablet (500 mg total) by mouth daily with breakfast.   Omega-3 Fatty Acids (FISH OIL) 1200 MG CAPS Take 1 capsule by mouth 2 (two) times daily.     Allergies:   Patient has no known allergies.   Social History   Socioeconomic History   Marital status: Married    Spouse name: Francena Hanly    Number of children: 3   Years of education: Not on file   Highest education level: Bachelor's degree (e.g., BA, AB, BS)  Occupational History    Employer: [employer]  Tobacco Use   Smoking status: Former    Packs/day: 1.00    Years: 24.00    Additional pack years: 0.00    Total pack years: 24.00    Types: Cigarettes    Quit date: 09/14/1997    Years since quitting: 25.0   Smokeless tobacco: Never  Substance and Sexual Activity   Alcohol use: Yes    Alcohol/week: 2.0 - 3.0 standard drinks of alcohol    Types: 1 Glasses of wine, 1 - 2 Cans of beer per week    Comment: Occasionally   Drug use: No   Sexual activity: Yes    Birth control/protection: None  Other Topics Concern   Not on file  Social History Narrative   Married, wife and children live in [place], he works in [place] , 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 [place]   Social Determinants of Health   Financial Resource Strain: Low Risk  (10/03/2022)   Overall Financial Resource Strain (CARDIA)    Difficulty of Paying Living Expenses: Not hard at all  Food Insecurity: No Food Insecurity (10/03/2022)   Hunger Vital Sign    Worried About Running Out of Food in the Last Year: Never true    Ran Out of Food in the Last Year: Never true  Transportation Needs: No Transportation Needs (10/03/2022)   PRAPARE - Administrator, Civil Service (Medical): No    Lack of Transportation (Non-Medical): No  Physical Activity: Sufficiently Active  (10/03/2022)   Exercise Vital Sign    Days of Exercise per Week: 7 days    Minutes of Exercise per Session: 60 min  Stress: No Stress Concern Present (10/03/2022)   [employer] of Occupational Health - Occupational Stress Questionnaire    Feeling of Stress : Not at all  Social Connections: Socially Integrated (10/03/2022)   Social Connection and Isolation Panel [NHANES]    Frequency of Communication with Friends and Family: More than three times a week    Frequency of Social Gatherings with Friends and Family: Once a week    Attends Religious Services: More than 4 times per year    Active Member of [employer] or Organizations: Yes    Attends Banker Meetings: Never    Marital Status: Married     Family History: The patient's family history includes Alcohol abuse in his brother and father; CAD in his father and mother; Diabetes in his father and mother; Heart disease in his father; Hyperlipidemia in his mother; Hypertension in his mother and sister; Scleroderma in his sister.  ROS:   Please see the history of present illness.     All other systems reviewed and are negative.  EKGs/Labs/Other Studies Reviewed:    The following studies were reviewed today:   EKG:  EKG is  ordered today.  The ekg ordered today demonstrates normal sinus rhythm, possible old inferior infarct.  Recent Labs: 09/05/2022: ALT 50; BUN 14; Creat 0.94; Hemoglobin 13.3; Platelets 232; Potassium 4.1; Sodium 139  Recent Lipid Panel    Component Value Date/Time   CHOL 84 09/05/2022 1035   CHOL 90 (L) 07/01/2021 0925   TRIG 67 09/05/2022 1035   HDL 37 (L) 09/05/2022 1035   HDL 33 (L) 07/01/2021 0925   CHOLHDL 2.3 09/05/2022 1035   VLDL 25 03/19/2019 0903   LDLCALC 33 09/05/2022 1035     Risk Assessment/Calculations:          Physical Exam:    VS:  BP 134/70 (BP Location: Left Arm, Patient Position: Sitting, Cuff Size: Large)   Pulse 74   Ht 5\' 11"  (1.803 m)   Wt 269 lb 3.2 oz (122.1  kg)   SpO2 96%   BMI 37.55 kg/m     Wt Readings from Last 3 Encounters:  10/10/22 269 lb 3.2 oz (122.1 kg)  10/03/22 270 lb (122.5 kg)  09/05/22 270 lb (122.5 kg)     GEN:  Well nourished, well developed in no acute distress HEENT: Normal NECK: No JVD; No carotid bruits CARDIAC: RRR, no murmurs, rubs, gallops RESPIRATORY:  Clear to auscultation without rales, wheezing or rhonchi  ABDOMEN: Soft, non-tender, distended MUSCULOSKELETAL:  No edema; No deformity  SKIN:  Warm and dry NEUROLOGIC:  Alert and oriented x 3 PSYCHIATRIC:  Normal affect   ASSESSMENT:    1. Primary hypertension   2. Pure hypercholesterolemia   3. Coronary artery calcification   4. BMI 37.0-37.9, adult    PLAN:    In order of problems listed above:  Hypertension, BP controlled.  Continue HCTZ, lisinopril. Hyperlipidemia, cholesterol controlled.  Continue Lipitor 40 mg daily. Coronary artery calcification, calcium score 6.1, 46 percentile.  Continue Lipitor. Obesity, continued weight loss and exercise advised.  Low-calorie diet advised.  Follow-up as needed.   Medication Adjustments/Labs and Tests Ordered: Current medicines are reviewed at length with the patient today.  Concerns regarding medicines are outlined above.  Orders Placed This Encounter  Procedures   EKG 12-Lead   No orders of the defined types were placed in this encounter.    Patient Instructions  Medication Instructions:  - Your physician recommends that you continue on your current medications as directed. Please refer to the Current Medication list given to you today.  *If you need a refill on your cardiac medications before your next appointment, please call your pharmacy*   Lab Work: - none ordered  If you have labs (blood work) drawn today and your tests are completely normal, you will receive your results only by: MyChart Message (if you have MyChart) OR A paper copy in the mail If you have any lab test that is  abnormal or we need to change your treatment, we will call you to review the results.   Testing/Procedures: - none ordered   Follow-Up: At [facility], you and your health needs are our priority.  As part of our continuing mission to provide you with exceptional heart care, we have created designated Provider Care Teams.  These Care Teams include your primary Cardiologist (physician) and Advanced Practice Providers (APPs -  Physician Assistants and Nurse Practitioners) who all work together to provide you with the care you need, when you need it.  We recommend signing up for the patient portal called "MyChart".  Sign up information is provided on this After Visit Summary.  MyChart is used to connect with patients for Virtual Visits (Telemedicine).  Patients are able to view lab/test results, encounter notes, upcoming appointments, etc.  Non-urgent messages can be sent to your provider as well.   To learn more about what you can do with MyChart, go to [email or web address].    Your next appointment:   As needed   Provider:   Debbe Odea, MD    Other Instructions N/a    Signed, Debbe Odea, MD  10/10/2022 5:13 PM    [facility]

## 2022-11-18 DIAGNOSIS — M1712 Unilateral primary osteoarthritis, left knee: Secondary | ICD-10-CM | POA: Diagnosis not present

## 2022-11-25 DIAGNOSIS — M1712 Unilateral primary osteoarthritis, left knee: Secondary | ICD-10-CM | POA: Diagnosis not present

## 2022-11-25 IMAGING — CR DG HAND COMPLETE 3+V*R*
3 series · 3 of 3 positions shown · non-contrast
Comparison: None.

CLINICAL DATA: Hand pain. Patient reports right thumb pain for 3
weeks. Fall catching himself with hand.

EXAM:
RIGHT HAND - COMPLETE 3+ VIEW

[hand ap]
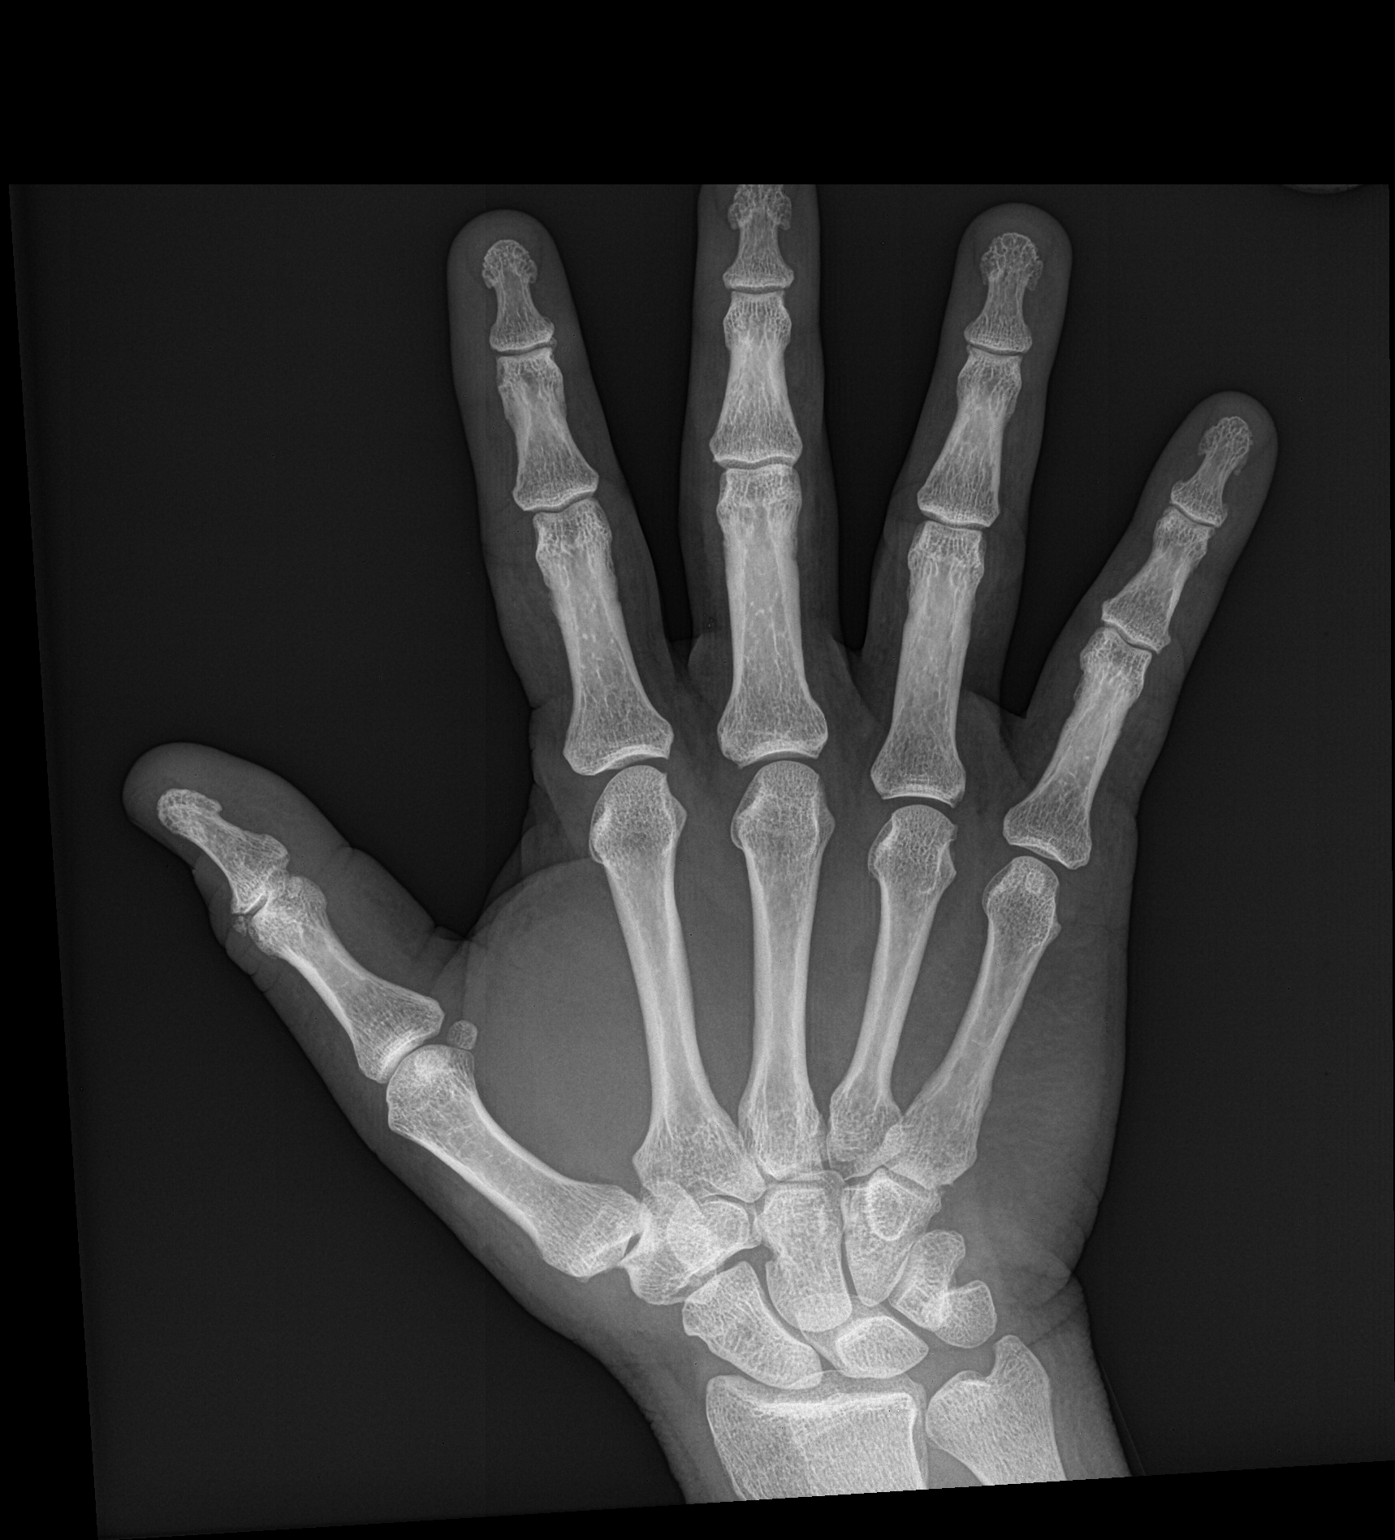

[hand obl]
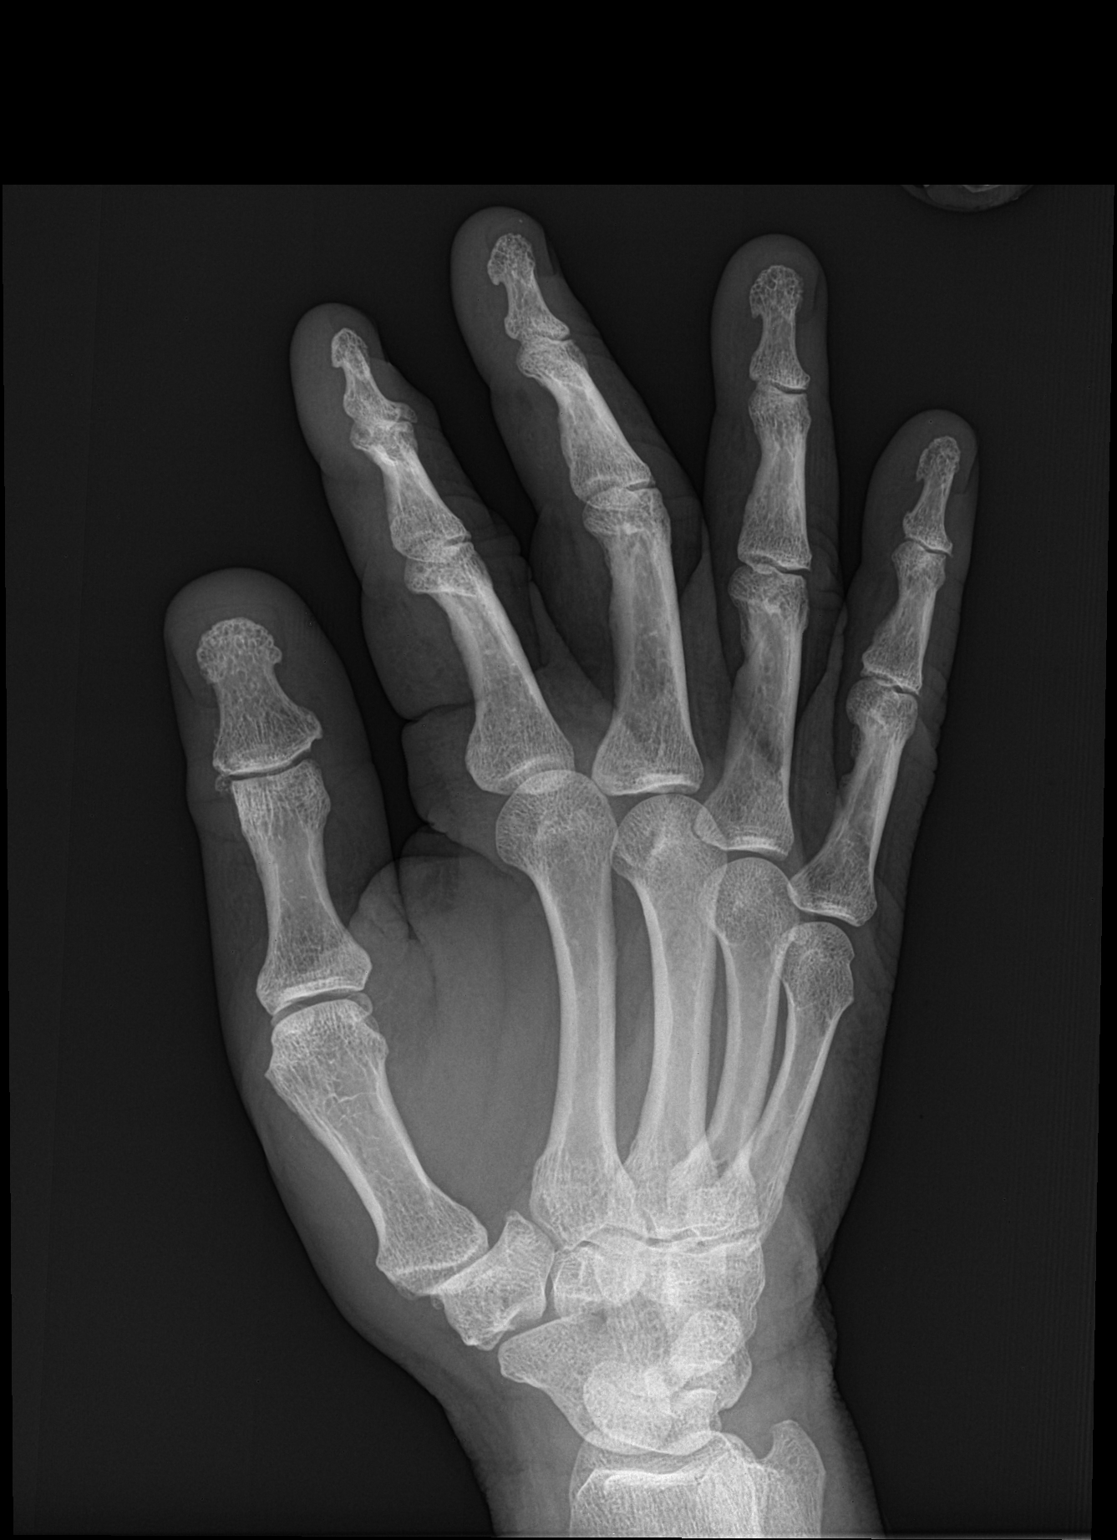

[hand lat]
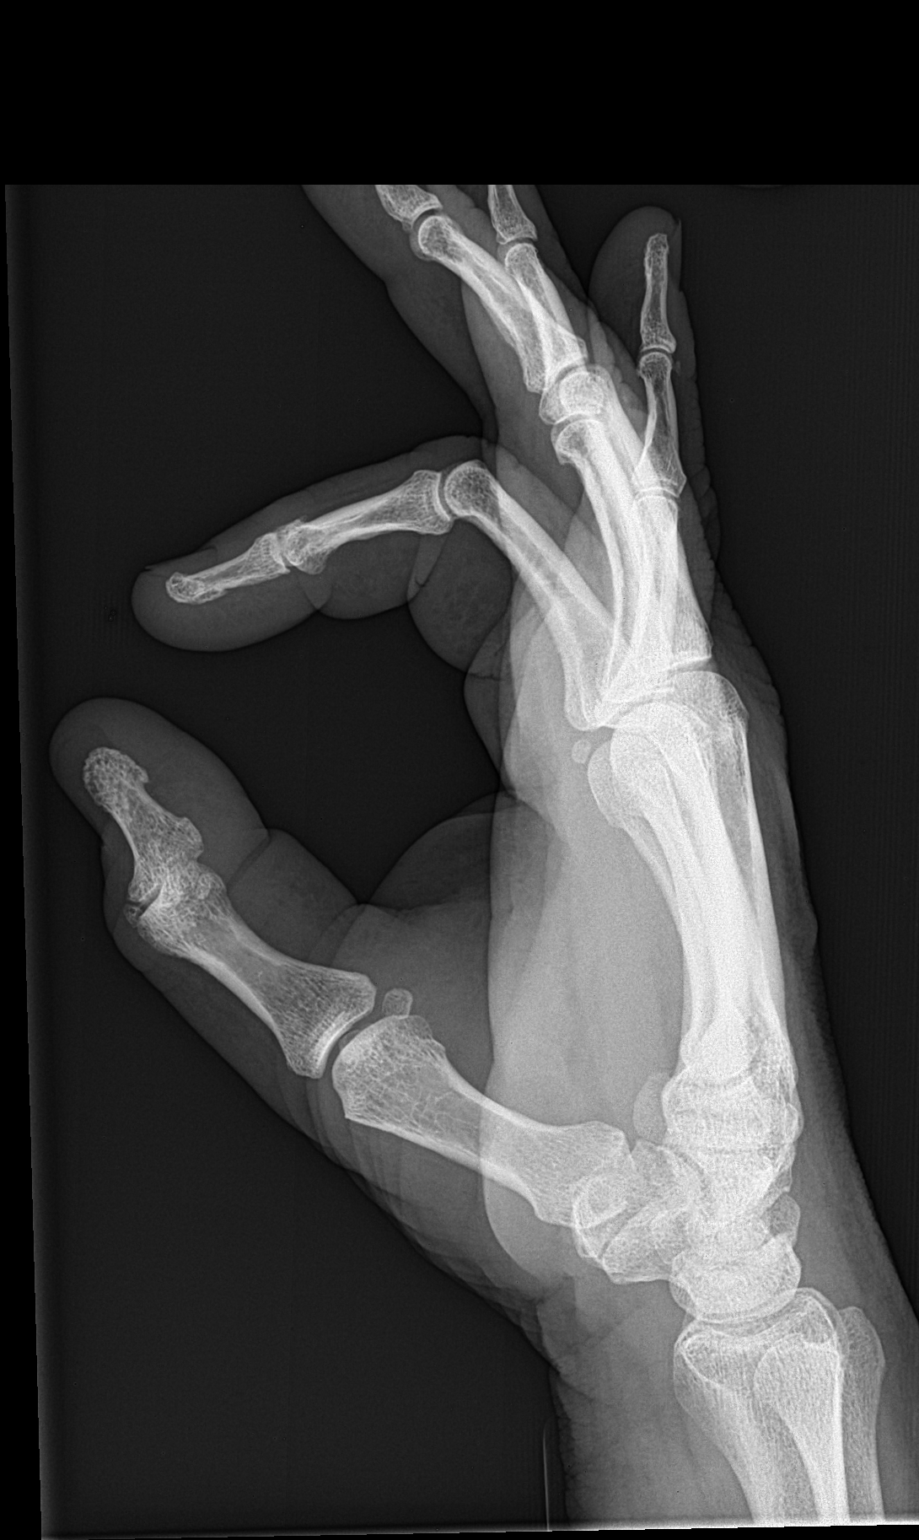

[3 of 3 positions shown; findings below may reference images not displayed]

FINDINGS: There is no evidence of fracture or dislocation. Mild osteoarthritis
throughout the digits as well as the thumb carpal metacarpal joint.
Soft tissues are unremarkable.
IMPRESSION: 1. No acute fracture or dislocation.
2. Mild osteoarthritis.

## 2022-11-27 DIAGNOSIS — M1712 Unilateral primary osteoarthritis, left knee: Secondary | ICD-10-CM | POA: Diagnosis not present

## 2022-12-03 DIAGNOSIS — M1712 Unilateral primary osteoarthritis, left knee: Secondary | ICD-10-CM | POA: Diagnosis not present

## 2022-12-09 DIAGNOSIS — M1712 Unilateral primary osteoarthritis, left knee: Secondary | ICD-10-CM | POA: Diagnosis not present

## 2022-12-22 DIAGNOSIS — M1712 Unilateral primary osteoarthritis, left knee: Secondary | ICD-10-CM | POA: Diagnosis not present

## 2022-12-25 DIAGNOSIS — M1712 Unilateral primary osteoarthritis, left knee: Secondary | ICD-10-CM | POA: Diagnosis not present

## 2022-12-31 DIAGNOSIS — M1712 Unilateral primary osteoarthritis, left knee: Secondary | ICD-10-CM | POA: Diagnosis not present

## 2023-01-06 DIAGNOSIS — M1712 Unilateral primary osteoarthritis, left knee: Secondary | ICD-10-CM | POA: Diagnosis not present

## 2023-01-09 DIAGNOSIS — M1712 Unilateral primary osteoarthritis, left knee: Secondary | ICD-10-CM | POA: Diagnosis not present

## 2023-01-13 DIAGNOSIS — M1712 Unilateral primary osteoarthritis, left knee: Secondary | ICD-10-CM | POA: Diagnosis not present

## 2023-01-20 DIAGNOSIS — M1712 Unilateral primary osteoarthritis, left knee: Secondary | ICD-10-CM | POA: Diagnosis not present

## 2023-01-22 DIAGNOSIS — M1712 Unilateral primary osteoarthritis, left knee: Secondary | ICD-10-CM | POA: Diagnosis not present

## 2023-01-23 DIAGNOSIS — M2242 Chondromalacia patellae, left knee: Secondary | ICD-10-CM | POA: Diagnosis not present

## 2023-01-23 DIAGNOSIS — M23301 Other meniscus derangements, unspecified lateral meniscus, left knee: Secondary | ICD-10-CM | POA: Diagnosis not present

## 2023-01-23 DIAGNOSIS — M1712 Unilateral primary osteoarthritis, left knee: Secondary | ICD-10-CM | POA: Diagnosis not present

## 2023-01-23 DIAGNOSIS — M1612 Unilateral primary osteoarthritis, left hip: Secondary | ICD-10-CM | POA: Diagnosis not present

## 2023-03-12 ENCOUNTER — Other Ambulatory Visit: Payer: Self-pay | Admitting: Family Medicine

## 2023-03-12 ENCOUNTER — Telehealth: Payer: Self-pay | Admitting: Family Medicine

## 2023-03-23 ENCOUNTER — Ambulatory Visit: Payer: BLUE CROSS/BLUE SHIELD | Admitting: Family Medicine

## 2023-03-24 ENCOUNTER — Other Ambulatory Visit: Payer: Self-pay | Admitting: Family Medicine

## 2023-03-24 DIAGNOSIS — I10 Essential (primary) hypertension: Secondary | ICD-10-CM

## 2023-04-17 ENCOUNTER — Other Ambulatory Visit: Payer: Self-pay | Admitting: Family Medicine

## 2023-04-17 DIAGNOSIS — E785 Hyperlipidemia, unspecified: Secondary | ICD-10-CM

## 2023-04-27 ENCOUNTER — Encounter: Payer: Self-pay | Admitting: Internal Medicine

## 2023-04-27 ENCOUNTER — Ambulatory Visit: Payer: BLUE CROSS/BLUE SHIELD | Admitting: Internal Medicine

## 2023-04-27 VITALS — BP 134/82 | HR 87 | Temp 97.8°F | Resp 18 | Ht 71.0 in | Wt 268.9 lb

## 2023-04-27 DIAGNOSIS — E785 Hyperlipidemia, unspecified: Secondary | ICD-10-CM

## 2023-04-27 DIAGNOSIS — Z23 Encounter for immunization: Secondary | ICD-10-CM

## 2023-04-27 DIAGNOSIS — E1165 Type 2 diabetes mellitus with hyperglycemia: Secondary | ICD-10-CM | POA: Diagnosis not present

## 2023-04-27 DIAGNOSIS — Z7984 Long term (current) use of oral hypoglycemic drugs: Secondary | ICD-10-CM

## 2023-04-27 DIAGNOSIS — I10 Essential (primary) hypertension: Secondary | ICD-10-CM

## 2023-04-27 LAB — POCT GLYCOSYLATED HEMOGLOBIN (HGB A1C): Hemoglobin A1C: 6.4 % — AB (ref 4.0–5.6)

## 2023-04-27 MED ORDER — LISINOPRIL-HYDROCHLOROTHIAZIDE 20-25 MG PO TABS: 1.0000 | ORAL_TABLET | Freq: Every day | ORAL | 1 refills | Status: DC

## 2023-04-27 MED ORDER — ATORVASTATIN CALCIUM 40 MG PO TABS: 40.0000 mg | ORAL_TABLET | Freq: Every day | ORAL | 1 refills | Status: DC

## 2023-04-27 MED ORDER — METFORMIN HCL ER 500 MG PO TB24: 500.0000 mg | ORAL_TABLET | Freq: Every day | ORAL | 1 refills | Status: DC

## 2023-04-27 NOTE — Progress Notes (Signed)
Established Patient Office Visit  Subjective   Patient ID: Eric English., male    DOB: 09-07-1955  Age: 67 y.o. MRN: [id number]  Chief Complaint  Patient presents with   Medication Refill   Follow-up    HPI  Patient is here to follow up on chronic medical conditions.  Patient is new to me.  Hypertension: -Medications: Lisinopril-hydrochlorothiazide 20-25 mg -Patient is compliant with above medications and reports no side effects. -Checking BP at home (average): Does not check -Denies any SOB, CP, vision changes, LE edema or symptoms of hypotension  HLD: -Medications: Lipitor 40 mg, omega 3 fatty acids  -Patient is compliant with above medications and reports no side effects.  -Last lipid panel: Lipid Panel     Component Value Date/Time   CHOL 84 09/05/2022 1035   CHOL 90 (L) 07/01/2021 0925   TRIG 67 09/05/2022 1035   HDL 37 (L) 09/05/2022 1035   HDL 33 (L) 07/01/2021 0925   CHOLHDL 2.3 09/05/2022 1035   VLDL 25 03/19/2019 0903   LDLCALC 33 09/05/2022 1035   LABVLDL 19 07/01/2021 0925   Diabetes, Type 2: -Last A1c 6.6% 3/24 -Medications: Metformin 500 mg XR -Patient is compliant with the above medications and reports no side effects.  -Eye exam: UTD -Foot exam: Due today -Microalbumin: Due today -Statin: Yes -PNA vaccine: Prevnar 13 -Denies symptoms of hypoglycemia, polyuria, polydipsia, numbness extremities, foot ulcers/trauma.   Left knee osteoarthritis: -Follows with orthopedics, had steroid shots every 3 months but states they are no longer helping as much as they used to -Was told he needs a knee replacement -Taking Tylenol in the morning and Aleve at night  ED: -Had been taking Cialis but not currently    Patient Active Problem List   Diagnosis Date Noted   Dyslipidemia 09/05/2022   Chronic pain of left knee 09/05/2022   Perennial allergic rhinitis with seasonal variation 03/03/2022   Internal derangement of knee 09/21/2020   Morbid obesity  (HCC) 02/27/2020   History of elevated PSA 08/12/2019   OSA on CPAP 08/12/2019   Hyperlipidemia associated with type 2 diabetes mellitus (HCC) 03/18/2019   Chronic left shoulder pain 01/04/2018   Chronic seasonal allergic rhinitis due to pollen 09/19/2016   Hemorrhoids 07/15/2016   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   Elevated liver enzymes 10/08/2015   Elevated PSA 07/11/2015   Superficial thrombophlebitis 06/23/2015   Diastasis recti 12/27/2013   Benign essential HTN 03/15/2010   Past Medical History:  Diagnosis Date   Abnormal glucose    Allergy    pollen   Arthritis    hands / knees   Hemorrhoids    Hypertension    Prostate enlargement    Sleep apnea    Trigger finger    Past Surgical History:  Procedure Laterality Date   COLONOSCOPY  06/16/2004   COLONOSCOPY WITH PROPOFOL N/A 08/15/2015   Procedure: COLONOSCOPY WITH PROPOFOL;  Surgeon: Earline Mayotte, MD;  Location: [facility] ENDOSCOPY;  Service: Endoscopy;  Laterality: N/A;   HERNIA REPAIR  06/17/2007   umbilical   VASECTOMY  06/16/1980   Social History   Tobacco Use   Smoking status: Former    Current packs/day: 0.00    Average packs/day: 1 pack/day for 24.0 years (24.0 ttl pk-yrs)    Types: Cigarettes    Start date: 09/14/1973    Quit date: 09/14/1997    Years since quitting: 25.6   Smokeless tobacco: Never  Substance Use Topics   Alcohol  use: Yes    Alcohol/week: 2.0 - 3.0 standard drinks of alcohol    Types: 1 Glasses of wine, 1 - 2 Cans of beer per week    Comment: Occasionally   Drug use: No   Social History   Socioeconomic History   Marital status: Married    Spouse name: Francena Hanly    Number of children: 3   Years of education: Not on file   Highest education level: Bachelor's degree (e.g., BA, AB, BS)  Occupational History    Employer: [employer]   Tobacco Use   Smoking status: Former    Current packs/day: 0.00    Average packs/day: 1 pack/day for 24.0 years (24.0 ttl  pk-yrs)    Types: Cigarettes    Start date: 09/14/1973    Quit date: 09/14/1997    Years since quitting: 25.6   Smokeless tobacco: Never  Substance and Sexual Activity   Alcohol use: Yes    Alcohol/week: 2.0 - 3.0 standard drinks of alcohol    Types: 1 Glasses of wine, 1 - 2 Cans of beer per week    Comment: Occasionally   Drug use: No   Sexual activity: Yes    Birth control/protection: None  Other Topics Concern   Not on file  Social History Narrative   Married, wife and children live in [place], he works in [place] , 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 [place]   Social Determinants of Health   Financial Resource Strain: Low Risk  (10/03/2022)   Overall Financial Resource Strain (CARDIA)    Difficulty of Paying Living Expenses: Not hard at all  Food Insecurity: No Food Insecurity (10/03/2022)   Hunger Vital Sign    Worried About Running Out of Food in the Last Year: Never true    Ran Out of Food in the Last Year: Never true  Transportation Needs: No Transportation Needs (10/03/2022)   PRAPARE - Administrator, Civil Service (Medical): No    Lack of Transportation (Non-Medical): No  Physical Activity: Sufficiently Active (10/03/2022)   Exercise Vital Sign    Days of Exercise per Week: 7 days    Minutes of Exercise per Session: 60 min  Stress: No Stress Concern Present (10/03/2022)   [employer] of Occupational Health - Occupational Stress Questionnaire    Feeling of Stress : Not at all  Social Connections: Socially Integrated (10/03/2022)   Social Connection and Isolation Panel [NHANES]    Frequency of Communication with Friends and Family: More than three times a week    Frequency of Social Gatherings with Friends and Family: Once a week    Attends Religious Services: More than 4 times per year    Active Member of [employer] or Organizations: Yes    Attends Banker Meetings: Never    Marital Status: Married  Catering manager Violence: Not At Risk  (10/03/2022)   Humiliation, Afraid, Rape, and Kick questionnaire    Fear of Current or Ex-Partner: No    Emotionally Abused: No    Physically Abused: No    Sexually Abused: No   Family Status  Relation Name Status   Mother JAMSE SEROKA Alive   Father Keilon Beek Deceased June 04, 1998 Deceased   Sister Dois Davenport Alive   Sister Evangelina Deceased   Brother Avitaj Scholler ( age 2) (Not Specified)  No partnership data on file   Family History  Problem Relation Age of Onset   Diabetes Mother    Hypertension Mother  CAD Mother    Hyperlipidemia Mother    Heart disease Father    CAD Father    Diabetes Father    Alcohol abuse Father    Hypertension Sister    Scleroderma Sister    Alcohol abuse Brother    No Known Allergies    Review of Systems  All other systems reviewed and are negative.     Objective:     BP 134/82   Pulse 87   Temp 97.8 F (36.6 C)   Resp 18   Ht 5\' 11"  (1.803 m)   Wt 268 lb 14.4 oz (122 kg)   SpO2 93%   BMI 37.50 kg/m  BP Readings from Last 3 Encounters:  04/27/23 134/82  10/10/22 134/70  09/05/22 126/76   Wt Readings from Last 3 Encounters:  04/27/23 268 lb 14.4 oz (122 kg)  10/10/22 269 lb 3.2 oz (122.1 kg)  10/03/22 270 lb (122.5 kg)      Physical Exam Constitutional:      Appearance: Normal appearance.  HENT:     Head: Normocephalic and atraumatic.  Eyes:     Conjunctiva/sclera: Conjunctivae normal.  Cardiovascular:     Rate and Rhythm: Normal rate and regular rhythm.     Pulses:          Dorsalis pedis pulses are 2+ on the right side and 2+ on the left side.  Pulmonary:     Effort: Pulmonary effort is normal.     Breath sounds: Normal breath sounds.  Musculoskeletal:     Right foot: Normal range of motion. No deformity, bunion, Charcot foot, foot drop or prominent metatarsal heads.     Left foot: Normal range of motion. No deformity, bunion, Charcot foot, foot drop or prominent metatarsal heads.  Feet:     Right foot:      Protective Sensation: 6 sites tested.  6 sites sensed.     Skin integrity: Skin integrity normal.     Toenail Condition: Right toenails are normal.     Left foot:     Protective Sensation: 6 sites tested.  6 sites sensed.     Skin integrity: Skin integrity normal.     Toenail Condition: Left toenails are normal.  Skin:    General: Skin is warm and dry.  Neurological:     General: No focal deficit present.     Mental Status: He is alert. Mental status is at baseline.  Psychiatric:        Mood and Affect: Mood normal.        Behavior: Behavior normal.      No results found for any visits on 04/27/23.  Last CBC Lab Results  Component Value Date   WBC 6.3 09/05/2022   HGB 13.3 09/05/2022   HCT 40.0 09/05/2022   MCV 88.7 09/05/2022   MCH 29.5 09/05/2022   RDW 13.1 09/05/2022   PLT 232 09/05/2022   Last metabolic panel Lab Results  Component Value Date   GLUCOSE 99 09/05/2022   NA 139 09/05/2022   K 4.1 09/05/2022   CL 103 09/05/2022   CO2 28 09/05/2022   BUN 14 09/05/2022   CREATININE 0.94 09/05/2022   EGFR 88 09/05/2022   CALCIUM 10.0 09/05/2022   PROT 8.1 09/05/2022   ALBUMIN 4.2 07/01/2021   LABGLOB 3.6 07/01/2021   AGRATIO 1.2 07/01/2021   BILITOT 0.3 09/05/2022   ALKPHOS 79 07/01/2021   AST 29 09/05/2022   ALT 50 (H) 09/05/2022   ANIONGAP  8 03/19/2019   Last lipids Lab Results  Component Value Date   CHOL 84 09/05/2022   HDL 37 (L) 09/05/2022   LDLCALC 33 09/05/2022   TRIG 67 09/05/2022   CHOLHDL 2.3 09/05/2022   Last hemoglobin A1c Lab Results  Component Value Date   HGBA1C 6.4 (A) 04/27/2023   Last thyroid functions Lab Results  Component Value Date   TSH 1.20 02/27/2020   Last vitamin D No results found for: "25OHVITD2", "25OHVITD3", "VD25OH" Last vitamin B12 and Folate No results found for: "VITAMINB12", "FOLATE"    The ASCVD Risk score (Arnett DK, et al., 2019) failed to calculate for the following reasons:   The valid total  cholesterol range is 130 to 320 mg/dL    Assessment & Plan:   1. Type 2 diabetes mellitus with hyperglycemia, without long-term current use of insulin (HCC): A1c improved today to 6.4%.  Will continue metformin 500 mg extended release daily, refilled.  Foot exam and microalbumin due today.  Follow-up in 6 months.  - POCT HgB A1C - Urine Microalbumin w/creat. ratio - HM Diabetes Foot Exam - metFORMIN (GLUCOPHAGE-XR) 500 MG 24 hr tablet; Take 1 tablet (500 mg total) by mouth daily with breakfast.  Dispense: 90 tablet; Refill: 1  2. Dyslipidemia: Stable, refill statin.  - atorvastatin (LIPITOR) 40 MG tablet; Take 1 tablet (40 mg total) by mouth daily.  Dispense: 90 tablet; Refill: 1  3. Benign essential HTN: Blood pressure stable here today, no changes made to medications and appropriate refills sent to pharmacy.   - lisinopril-hydrochlorothiazide (ZESTORETIC) 20-25 MG tablet; Take 1 tablet by mouth daily.  Dispense: 90 tablet; Refill: 1  4. Need for influenza vaccination: Flu vaccine administered today.   - Flu Vaccine Trivalent High Dose (Fluad)   Return in about 6 months (around 10/25/2023).    Margarita Mail, DO

## 2023-04-28 LAB — MICROALBUMIN / CREATININE URINE RATIO
Creatinine, Urine: 153 mg/dL (ref 20–320)
Microalb Creat Ratio: 16 mg/g{creat} (ref <30)
Microalb, Ur: 2.5 mg/dL

## 2023-05-01 ENCOUNTER — Ambulatory Visit: Payer: BLUE CROSS/BLUE SHIELD | Admitting: Family Medicine

## 2023-05-28 ENCOUNTER — Encounter: Payer: Self-pay | Admitting: Family Medicine

## 2023-05-28 NOTE — Telephone Encounter (Signed)
 Care team updated and letter sent for eye exam notes.

## 2023-07-21 DIAGNOSIS — M1712 Unilateral primary osteoarthritis, left knee: Secondary | ICD-10-CM | POA: Diagnosis not present

## 2023-07-21 DIAGNOSIS — M25562 Pain in left knee: Secondary | ICD-10-CM | POA: Diagnosis not present

## 2023-09-07 ENCOUNTER — Ambulatory Visit

## 2023-09-07 DIAGNOSIS — R262 Difficulty in walking, not elsewhere classified: Secondary | ICD-10-CM | POA: Diagnosis present

## 2023-09-07 DIAGNOSIS — M25662 Stiffness of left knee, not elsewhere classified: Secondary | ICD-10-CM

## 2023-09-07 DIAGNOSIS — M25562 Pain in left knee: Secondary | ICD-10-CM | POA: Diagnosis present

## 2023-09-07 NOTE — Therapy (Signed)
 OUTPATIENT PHYSICAL THERAPY LOWER EXTREMITY EVALUATION   Patient Name: Eric English. MRN: [id number] DOB:1956/03/12, 68 y.o., male Today's Date: 09/07/2023  END OF SESSION:  PT End of Session - 09/07/23 1733     Visit Number 1    Number of Visits 17    Date for PT Re-Evaluation 11/02/23    Authorization Type 2x/week x 8 weeks (re-cert needed 09/23/79)    PT Start Time 1415    PT Stop Time 1500    PT Time Calculation (min) 45 min    Activity Tolerance Patient tolerated treatment well    Behavior During Therapy [facility] for tasks assessed/performed             Past Medical History:  Diagnosis Date   Abnormal glucose    Allergy    pollen   Arthritis    hands / knees   Hemorrhoids    Hypertension    Prostate enlargement    Sleep apnea    Trigger finger    Past Surgical History:  Procedure Laterality Date   COLONOSCOPY  06/16/2004   COLONOSCOPY WITH PROPOFOL N/A 08/15/2015   Procedure: COLONOSCOPY WITH PROPOFOL;  Surgeon: Earline Mayotte, MD;  Location: [facility] ENDOSCOPY;  Service: Endoscopy;  Laterality: N/A;   HERNIA REPAIR  06/17/2007   umbilical   VASECTOMY  06/16/1980   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   Elevated liver enzymes 10/08/2015   Elevated PSA 07/11/2015   Superficial thrombophlebitis 06/23/2015   Diastasis recti 12/27/2013   Benign essential HTN 03/15/2010    PCP: Dr. Carlynn Purl, MD  REFERRING PROVIDER: Dr.  Swaziland, MD  REFERRING DIAG: s/p hemiarthroplasty L knee (medial compartment)  THERAPY DIAG:  Acute pain of left knee  Stiffness of left knee, not elsewhere classified  Rationale for Evaluation and Treatment: Rehabilitation  ONSET DATE: 09/04/23  SUBJECTIVE:   SUBJECTIVE STATEMENT: Pt underwent left medial unicompartmental knee arthroplasty on 09/04/23.  He had surgery and then d/c home same day.  Had surgery and then came home from [facility].    C/o tightness/pain in knee, difficulty walking  Has a HEP and worked on it over the weekend   Pain control: Tramadol, pain medication and ice pack, also using a heating pad some pad behind knee knee too    Pt using FWW currently  Pt's wife is present today; has good support- lives part time locally and part time in [place] (for a long time) due to his work  PERTINENT HISTORY: Decided to have surgery b/c of OA; he is very active, was in the [employer] for many years prior to this.  PAIN:  Are you having pain? Best 5/10 and worst 10/10   PRECAUTIONS: None  RED FLAGS: None   WEIGHT BEARING RESTRICTIONS: No  FALLS:  Has patient fallen in last 6 months? No  LIVING ENVIRONMENT: Lives with: lives with their spouse Lives in: House/apartment Stairs: 3 steps to enter,  14 stairs inside to second floor  for bedroom; in [place] he has 2 steps in and then single level once inside Has following equipment at home: Dan Humphreys - 2 wheeled  OCCUPATION: desk job; Arts development officer in [place]   PLOF: Independent  PATIENT GOALS: to return to riding a bike, walk, fishing, get back to work- hoping by 10/19/23  NEXT MD VISIT: 09/21/23; 10/12/23  OBJECTIVE:  Note: Objective measures were completed at Evaluation unless otherwise noted.  DIAGNOSTIC FINDINGS: post op  PATIENT SURVEYS:  LEFS 20  COGNITION: Overall cognitive status: Within functional limits for tasks assessed     SENSATION: WFL  EDEMA:  Typical postop swelling of L LE  noted   POSTURE: Pt's FWW appears slightly tall, shoulders shrugged when amb  PALPATION: TTP throughout L knee  LOWER EXTREMITY ROM:  Active ROM Right eval Left eval  Hip flexion    Hip extension    Hip abduction    Hip adduction    Hip internal rotation    Hip external rotation    Knee flexion  80-85  Knee extension  Lacking 5-10  Ankle dorsiflexion    Ankle plantarflexion    Ankle inversion    Ankle eversion     (Blank rows = not tested)  LOWER EXTREMITY MMT:  MMT Right eval Left eval  Hip flexion    Hip extension    Hip abduction    Hip adduction    Hip internal rotation    Hip external rotation    Knee flexion  3+  Knee extension  3+  Ankle dorsiflexion    Ankle plantarflexion    Ankle inversion    Ankle eversion     (Blank rows = not tested)   FUNCTIONAL TESTS:  Pt able to transfer sit to stand and sit to supine independently; uses UE support for sit to stand  GAIT: Distance walked: in/out of clinic Assistive device utilized: Environmental consultant - 2 wheeled Level of assistance: Complete Independence Comments: pt with decreased WB on L LE, decreased knee flex during swing phase on L                                                                                                                                TREATMENT DATE: 09/07/23  Initial evaluation performed PROM: knee flex x 5 with pt Initiated HEP/reviewed/practiced current HEP- PT verbal/tactile cues- quad sets, hold on SLR, glute squeeze, ankle pumps; added heel prop on towel roll for extension stretch 3-5 min 3x/day, try to hold on propping knee up on towel roll for  extended time in flexion to promote achieving full AROM    PATIENT EDUCATION:  Education details: PT POC/goals, HEP, strategies for managing swelling/pain with elevation and cryotherapy, not overdoing it in early post-op phase Person educated: Patient and Spouse Education method: Explanation, Demonstration, Tactile cues, Verbal cues, and  Handouts Education comprehension: verbalized understanding and returned demonstration  HOME EXERCISE PROGRAM: Medbridge at visit #2  ASSESSMENT:  CLINICAL IMPRESSION: Patient is a 68 y.o.  M who was seen today for physical therapy evaluation and treatment for a course of post-operative physical therapy after undergoing L knee partial hemiarthroplasty (medial compartment) on 09/04/23.  He is in the early post-operative recovery phase and he should do well with PT based on findings noted today.   OBJECTIVE IMPAIRMENTS: Abnormal gait, decreased activity tolerance, decreased endurance, decreased mobility, difficulty walking, decreased ROM, decreased strength, impaired perceived functional ability, and pain.   ACTIVITY LIMITATIONS: carrying, lifting, bending, sitting, standing, squatting, sleeping, stairs, transfers, and locomotion level  PARTICIPATION LIMITATIONS: meal prep, cleaning, interpersonal relationship, driving, community activity, occupation, and yard work  PERSONAL FACTORS: Age and Time since onset of injury/illness/exacerbation are also affecting patient's functional outcome.   REHAB POTENTIAL: Excellent  CLINICAL DECISION MAKING: Stable/uncomplicated  EVALUATION COMPLEXITY: Low   GOALS: Goals reviewed with patient? Yes  SHORT TERM GOALS: Target date: 09/29/23 Pt will amb with achieving L heel strike and normal swing through gait mechanics on L LE with least restrictive AD x 10 min on flat surfaces Baseline: heel strike impaired, knee flex impaired Goal status: INITIAL   LONG TERM GOALS: Target date: 11/02/23  Improve LEFS >18 (MDC 9) indicating pt able to perform his daily and work activities without being as limited by L knee Baseline: 20 Goal status: INITIAL  2.  Pt's L knee AROM will improve 0-120 to facilitate being able to amb, ascend/descend stairs, ride in car, get up/down from sitting with normal LE mechanics and not being limited by L knee tightness Baseline:  flexion 85 Goal status: INITIAL  3.  Improve L LE strength 1 MMT grade to facilitate being able to walk x 20 minutes with least restrictive or no AD to resume fishing, light yardwork/housework independently Baseline: 3+/5 Goal status: INITIAL    PLAN:  PT FREQUENCY: 2x/week  PT DURATION: 8 weeks  PLANNED INTERVENTIONS: 97110-Therapeutic exercises, 97530- Therapeutic activity, 97112- Neuromuscular re-education, 97535- Self Care, and 45409- Manual therapy  PLAN FOR NEXT SESSION: ROM for L knee, quad mm training  Max Fickle, PT, DPT, OCS  Ardine Bjork, PT 09/07/2023, 5:35 PM

## 2023-09-09 ENCOUNTER — Ambulatory Visit

## 2023-09-09 DIAGNOSIS — M25562 Pain in left knee: Secondary | ICD-10-CM | POA: Diagnosis not present

## 2023-09-09 DIAGNOSIS — M25662 Stiffness of left knee, not elsewhere classified: Secondary | ICD-10-CM

## 2023-09-09 NOTE — Therapy (Signed)
 OUTPATIENT PHYSICAL THERAPY LOWER EXTREMITY TREATMENT   Patient Name: Eric English. MRN: [id number] DOB:07-06-1955, 68 y.o., male Today's Date: 09/09/2023  END OF SESSION:  PT End of Session - 09/09/23 1402     Visit Number 2    Number of Visits 17    Date for PT Re-Evaluation 11/02/23    Authorization Type 2x/week x 8 weeks (re-cert needed 09/07/38)    PT Start Time 1115    PT Stop Time 1205    PT Time Calculation (min) 50 min    Activity Tolerance Patient tolerated treatment well    Behavior During Therapy [facility] for tasks assessed/performed             Past Medical History:  Diagnosis Date   Abnormal glucose    Allergy    pollen   Arthritis    hands / knees   Hemorrhoids    Hypertension    Prostate enlargement    Sleep apnea    Trigger finger    Past Surgical History:  Procedure Laterality Date   COLONOSCOPY  06/16/2004   COLONOSCOPY WITH PROPOFOL N/A 08/15/2015   Procedure: COLONOSCOPY WITH PROPOFOL;  Surgeon: Earline Mayotte, MD;  Location: [facility] ENDOSCOPY;  Service: Endoscopy;  Laterality: N/A;   HERNIA REPAIR  06/17/2007   umbilical   VASECTOMY  06/16/1980   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   Elevated liver enzymes 10/08/2015   Elevated PSA 07/11/2015   Superficial thrombophlebitis 06/23/2015   Diastasis recti 12/27/2013   Benign essential HTN 03/15/2010    PCP: Dr. Carlynn Purl, MD  REFERRING PROVIDER: Dr.  Swaziland, MD  REFERRING DIAG: s/p hemiarthroplasty L knee (medial compartment)  THERAPY DIAG:  Acute pain of left knee  Stiffness of left knee, not elsewhere classified  Rationale for Evaluation and Treatment: Rehabilitation  ONSET DATE: 09/04/23  SUBJECTIVE: (from initial evaluation note 09/07/23)  SUBJECTIVE STATEMENT: Pt underwent left medial unicompartmental knee arthroplasty on 09/04/23.  He had surgery and then d/c home same day.  Had surgery and then came home from [facility].    C/o tightness/pain in knee, difficulty walking  Has a HEP and worked on it over the weekend   Pain control: Tramadol, pain medication and ice pack, also using a heating pad some pad behind knee knee too    Pt using FWW currently  Pt's wife is present today; has good support- lives part time locally and part time in [place] (for a long time) due to his work  PERTINENT HISTORY: Decided to have surgery b/c of OA; he is very active, was in the [employer] for many years prior to this.  PAIN:  Are you having pain? Best 5/10 and worst 10/10   PRECAUTIONS: None  RED FLAGS: None   WEIGHT BEARING RESTRICTIONS: No  FALLS:  Has patient fallen in last 6 months? No  LIVING ENVIRONMENT: Lives with: lives with their spouse Lives in: House/apartment Stairs: 3 steps to enter,  14 stairs  inside to second floor for bedroom; in [place] he has 2 steps in and then single level once inside Has following equipment at home: Dan Humphreys - 2 wheeled  OCCUPATION: desk job; Arts development officer in [place]   PLOF: Independent  PATIENT GOALS: to return to riding a bike, walk, fishing, get back to work- hoping by 10/19/23  NEXT MD VISIT: 09/21/23; 10/12/23  OBJECTIVE:  Note: Objective measures were completed at Evaluation unless otherwise noted.  DIAGNOSTIC FINDINGS: post op  PATIENT SURVEYS:  LEFS 20  COGNITION: Overall cognitive status: Within functional limits for tasks assessed     SENSATION: WFL  EDEMA:   Typical postop swelling of L LE noted   POSTURE: Pt's FWW appears slightly tall, shoulders shrugged when amb  PALPATION: TTP throughout L knee  LOWER EXTREMITY ROM:  Active ROM Right eval Left eval  Hip flexion    Hip extension    Hip abduction    Hip adduction    Hip internal rotation    Hip external rotation    Knee flexion  80-85  Knee extension  Lacking 5-10  Ankle dorsiflexion    Ankle plantarflexion    Ankle inversion    Ankle eversion     (Blank rows = not tested)  LOWER EXTREMITY MMT:  MMT Right eval Left eval  Hip flexion    Hip extension    Hip abduction    Hip adduction    Hip internal rotation    Hip external rotation    Knee flexion  3+  Knee extension  3+  Ankle dorsiflexion    Ankle plantarflexion    Ankle inversion    Ankle eversion     (Blank rows = not tested)   FUNCTIONAL TESTS:  Pt able to transfer sit to stand and sit to supine independently; uses UE support for sit to stand  GAIT: Distance walked: in/out of clinic Assistive device utilized: Environmental consultant - 2 wheeled Level of assistance: Complete Independence Comments: pt with decreased WB on L LE, decreased knee flex during swing phase on L                                                                                                                                TREATMENT DATE: 09/09/23  Subjective: Pt worked on his HEP; he reports being very compliant and did them multiple times/day; also walking 10/23/13 min intervals with his walker; hasn't practiced elevating leg above heart, but is continuing to use cold pack at home.  He likes the new height his walker is set at.    Objective: Pain 8/10 knee Knee swelling observed upon arrival  Therapeutic Exercises: Supine with leg elevated with knee above heart: Ankle pumps x 20 Glute sets x 20 Quad set x 20  Partial range SLR with PT assist: x15 MRE hip abd/add (knee extended) with PT 2x10 Knee extension heel propped x 3  minutes  HEP instruction/pt education (see below)  Manual Therapy: Manual knee PROM knee flexion/extension with pt in supine hooklying x 7 min Gentle knee extension manual stretch with foot propped on bolster x 3 minutes  Cold pack in elevated position x 10 min at end, no charge  Pt education about importance of continuing to manage post-op swelling and pain and not "overdoing it" with his HEP; discussed strategies for elevation with cold pack  PATIENT EDUCATION:  Education details: PT POC/goals, HEP, strategies for managing swelling/pain with elevation and cryotherapy, not overdoing it in early post-op phase Person educated: Patient and Spouse Education method: Explanation, Demonstration, Tactile cues, Verbal cues, and Handouts Education comprehension: verbalized understanding and returned demonstration  HOME EXERCISE PROGRAM: Access Code: [email or web address] URL: [email or web address] Date: 09/09/2023 Prepared by: Max Fickle  Exercises - Supine Quad Set  - 2 x daily - 7 x weekly - 2 sets - 10 reps - 5 hold - Supine Heel Slide  - 2 x daily - 7 x weekly - 2 sets - 10 reps - Small Range Straight Leg Raise  - 2 x daily - 7 x weekly - 2 sets - 10 reps - Seated Knee Extension Stretch with Chair  - 2 x daily - 7 x weekly - 3 min-5 min hold - Supine Hip Abduction  - 2 x daily - 7 x weekly - 2 sets - 10 reps - Standing Heel Raises  - 2 x daily - 7 x weekly - 2 sets - 10 reps - Standing Knee Flexion with Counter Support  - 2 x daily - 7 x weekly - 2 sets - 10 reps  ASSESSMENT:  CLINICAL IMPRESSION: Patient arrived with high pain level 8/10 noted and knee visibly swollen; incorporated therapeutic exercises today in a gravity reduced position to help with edema management.  Overall, his knee is moving well with PROM (achieved 95 deg flexion today).  He is in the early post-operative recovery phase (surgery 09/04/23) and he should do well with PT; educated pt on tissue/acute healing  phase and importance of managing swelling and pain and not overdoing physical activity during this first week for optimal long term outcome.  OBJECTIVE IMPAIRMENTS: Abnormal gait, decreased activity tolerance, decreased endurance, decreased mobility, difficulty walking, decreased ROM, decreased strength, impaired perceived functional ability, and pain.   ACTIVITY LIMITATIONS: carrying, lifting, bending, sitting, standing, squatting, sleeping, stairs, transfers, and locomotion level  PARTICIPATION LIMITATIONS: meal prep, cleaning, interpersonal relationship, driving, community activity, occupation, and yard work  PERSONAL FACTORS: Age and Time since onset of injury/illness/exacerbation are also affecting patient's functional outcome.   REHAB POTENTIAL: Excellent  CLINICAL DECISION MAKING: Stable/uncomplicated  EVALUATION COMPLEXITY: Low   GOALS: Goals reviewed with patient? Yes  SHORT TERM GOALS: Target date: 09/29/23 Pt will amb with achieving L heel strike and normal swing through gait mechanics on L LE with least restrictive AD x 10 min on flat surfaces Baseline: heel strike impaired, knee flex impaired Goal status: INITIAL   LONG TERM GOALS: Target date: 11/02/23  Improve LEFS >18 (MDC 9) indicating pt able to perform his daily and work activities without being as limited by L knee Baseline: 20 Goal status: INITIAL  2.  Pt's L knee AROM will improve 0-120 to facilitate being able to amb, ascend/descend stairs, ride in car, get up/down from sitting with normal LE mechanics and not being limited by L knee tightness Baseline: flexion 85 Goal status: INITIAL  3.  Improve L LE strength 1 MMT grade to facilitate being able to walk x 20 minutes  with least restrictive or no AD to resume fishing, light yardwork/housework independently Baseline: 3+/5 Goal status: INITIAL    PLAN:  PT FREQUENCY: 2x/week  PT DURATION: 8 weeks  PLANNED INTERVENTIONS: 97110-Therapeutic exercises,  97530- Therapeutic activity, [id number]- Neuromuscular re-education, 97535- Self Care, and [id number]- Manual therapy  PLAN FOR NEXT SESSION: ROM for L knee, quad mm training  Max Fickle, PT, DPT, OCS  Ardine Bjork, PT 09/09/2023, 2:03 PM

## 2023-09-12 NOTE — Therapy (Incomplete)
 OUTPATIENT PHYSICAL THERAPY LOWER EXTREMITY TREATMENT   Patient Name: Eric English. MRN: [id number] DOB:[birth year], 68 y.o., male Today's Date: 09/12/2023  END OF SESSION:    Past Medical History:  Diagnosis Date   Abnormal glucose    Allergy    pollen   Arthritis    hands / knees   Hemorrhoids    Hypertension    Prostate enlargement    Sleep apnea    Trigger finger    Past Surgical History:  Procedure Laterality Date   COLONOSCOPY  06/16/2004   COLONOSCOPY WITH PROPOFOL N/A 08/15/2015   Procedure: COLONOSCOPY WITH PROPOFOL;  Surgeon: Earline Mayotte, MD;  Location: [facility] ENDOSCOPY;  Service: Endoscopy;  Laterality: N/A;   HERNIA REPAIR  06/17/2007   umbilical   VASECTOMY  06/16/1980   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   Elevated liver enzymes 10/08/2015   Elevated PSA 07/11/2015   Superficial thrombophlebitis 06/23/2015   Diastasis recti 12/27/2013   Benign essential HTN 03/15/2010    PCP: Dr. Carlynn Purl, MD  REFERRING PROVIDER: Dr. Swaz[place], MD  REFERRING DIAG: s/p hemiarthroplasty L knee (medial compartment)  THERAPY DIAG:  Acute pain of left knee  Stiffness of left knee, not elsewhere classified  Rationale for Evaluation and Treatment: Rehabilitation  ONSET DATE: 09/04/23  SUBJECTIVE: (from initial evaluation note 09/07/23)  SUBJECTIVE STATEMENT: Pt underwent left medial unicompartmental knee arthroplasty on  09/04/23.  He had surgery and then d/c home same day.  Had surgery and then came home from [facility].    C/o tightness/pain in knee, difficulty walking  Has a HEP and worked on it over the weekend   Pain control: Tramadol, pain medication and ice pack, also using a heating pad some pad behind knee knee too    Pt using FWW currently  Pt's wife is present today; has good support- lives part time locally and part time in [place] (for a long time) due to his work  PERTINENT HISTORY: Decided to have surgery b/c of OA; he is very active, was in the [employer] for many years prior to this.  PAIN:  Are you having pain? Best 5/10 and worst 10/10   PRECAUTIONS: None  RED FLAGS: None   WEIGHT BEARING RESTRICTIONS: No  FALLS:  Has patient fallen in last 6 months? No  LIVING ENVIRONMENT: Lives with: lives with their spouse Lives in: House/apartment Stairs: 3 steps to enter,  14 stairs inside to second floor for bedroom; in [place] he has 2 steps in and then single level once inside Has following equipment at home: Dan Humphreys - 2 wheeled  OCCUPATION: desk job; Arts development officer in [place]   PLOF: Independent  PATIENT GOALS: to return to riding a bike, walk, fishing, get back to work- hoping by 10/19/23  NEXT MD VISIT: 09/21/23; 10/12/23  OBJECTIVE:  Note: Objective measures were completed at Evaluation unless otherwise noted.  DIAGNOSTIC FINDINGS: post op  PATIENT SURVEYS:  LEFS 20  COGNITION: Overall cognitive  status: Within functional limits for tasks assessed     SENSATION: WFL  EDEMA:  Typical postop swelling of L LE noted   POSTURE: Pt's FWW appears slightly tall, shoulders shrugged when amb  PALPATION: TTP throughout L knee  LOWER EXTREMITY ROM:  Active ROM Right eval Left eval  Hip flexion    Hip extension    Hip abduction    Hip adduction    Hip internal rotation    Hip external rotation    Knee flexion  80-85  Knee extension  Lacking 5-10  Ankle  dorsiflexion    Ankle plantarflexion    Ankle inversion    Ankle eversion     (Blank rows = not tested)  LOWER EXTREMITY MMT:  MMT Right eval Left eval  Hip flexion    Hip extension    Hip abduction    Hip adduction    Hip internal rotation    Hip external rotation    Knee flexion  3+  Knee extension  3+  Ankle dorsiflexion    Ankle plantarflexion    Ankle inversion    Ankle eversion     (Blank rows = not tested)   FUNCTIONAL TESTS:  Pt able to transfer sit to stand and sit to supine independently; uses UE support for sit to stand  GAIT: Distance walked: in/out of clinic Assistive device utilized: Environmental consultant - 2 wheeled Level of assistance: Complete Independence Comments: pt with decreased WB on L LE, decreased knee flex during swing phase on L                                                                                                                                TREATMENT DATE: 09/09/23  Subjective: Pt worked on his HEP; he reports being very compliant and did them multiple times/day; also walking 10/23/13 min intervals with his walker; hasn't practiced elevating leg above heart, but is continuing to use cold pack at home.  He likes the new height his walker is set at.    Objective: Pain 8/10 knee Knee swelling observed upon arrival  Therapeutic Exercises: Supine with leg elevated with knee above heart: Ankle pumps x 20 Glute sets x 20 Quad set x 20  Partial range SLR with PT assist: x15 MRE hip abd/add (knee extended) with PT 2x10 Knee extension heel propped x 3 minutes  HEP instruction/pt education (see below)   Manual Therapy: Manual knee PROM knee flexion/extension with pt in supine hooklying x 7 min Gentle knee extension manual stretch with foot propped on bolster x 3 minutes  Cold pack in elevated position x 10 min at end, no charge  Pt education about importance of continuing to manage post-op swelling and pain and not "overdoing it" with his  HEP; discussed strategies for elevation with cold pack  PATIENT EDUCATION:  Education details: PT POC/goals, HEP, strategies for managing swelling/pain with elevation and cryotherapy, not overdoing it in early post-op  phase Person educated: Patient and Spouse Education method: Explanation, Demonstration, Tactile cues, Verbal cues, and Handouts Education comprehension: verbalized understanding and returned demonstration  HOME EXERCISE PROGRAM: Access Code: [email or web address] URL: [email or web address] Date: 09/09/2023 Prepared by: Max Fickle  Exercises - Supine Quad Set  - 2 x daily - 7 x weekly - 2 sets - 10 reps - 5 hold - Supine Heel Slide  - 2 x daily - 7 x weekly - 2 sets - 10 reps - Small Range Straight Leg Raise  - 2 x daily - 7 x weekly - 2 sets - 10 reps - Seated Knee Extension Stretch with Chair  - 2 x daily - 7 x weekly - 3 min-5 min hold - Supine Hip Abduction  - 2 x daily - 7 x weekly - 2 sets - 10 reps - Standing Heel Raises  - 2 x daily - 7 x weekly - 2 sets - 10 reps - Standing Knee Flexion with Counter Support  - 2 x daily - 7 x weekly - 2 sets - 10 reps  ASSESSMENT:  CLINICAL IMPRESSION: Patient arrived with high pain level 8/10 noted and knee visibly swollen; incorporated therapeutic exercises today in a gravity reduced position to help with edema management.  Overall, his knee is moving well with PROM (achieved 95 deg flexion today).  He is in the early post-operative recovery phase (surgery 09/04/23) and he should do well with PT; educated pt on tissue/acute healing phase and importance of managing swelling and pain and not overdoing physical activity during this first week for optimal long term outcome.  OBJECTIVE IMPAIRMENTS: Abnormal gait, decreased activity tolerance, decreased endurance, decreased mobility, difficulty walking, decreased ROM, decreased strength, impaired perceived functional ability, and pain.   ACTIVITY LIMITATIONS: carrying, lifting,  bending, sitting, standing, squatting, sleeping, stairs, transfers, and locomotion level  PARTICIPATION LIMITATIONS: meal prep, cleaning, interpersonal relationship, driving, community activity, occupation, and yard work  PERSONAL FACTORS: Age and Time since onset of injury/illness/exacerbation are also affecting patient's functional outcome.   REHAB POTENTIAL: Excellent  CLINICAL DECISION MAKING: Stable/uncomplicated  EVALUATION COMPLEXITY: Low   GOALS: Goals reviewed with patient? Yes  SHORT TERM GOALS: Target date: 09/29/23 Pt will amb with achieving L heel strike and normal swing through gait mechanics on L LE with least restrictive AD x 10 min on flat surfaces Baseline: heel strike impaired, knee flex impaired Goal status: INITIAL   LONG TERM GOALS: Target date: 11/02/23  Improve LEFS >18 (MDC 9) indicating pt able to perform his daily and work activities without being as limited by L knee Baseline: 20 Goal status: INITIAL  2.  Pt's L knee AROM will improve 0-120 to facilitate being able to amb, ascend/descend stairs, ride in car, get up/down from sitting with normal LE mechanics and not being limited by L knee tightness Baseline: flexion 85 Goal status: INITIAL  3.  Improve L LE strength 1 MMT grade to facilitate being able to walk x 20 minutes with least restrictive or no AD to resume fishing, light yardwork/housework independently Baseline: 3+/5 Goal status: INITIAL    PLAN:  PT FREQUENCY: 2x/week  PT DURATION: 8 weeks  PLANNED INTERVENTIONS: 97110-Therapeutic exercises, 97530- Therapeutic activity, [id number]- Neuromuscular re-education, 97535- Self Care, and 08657- Manual therapy  PLAN FOR NEXT SESSION: ROM for L knee, quad mm training  Max Fickle, PT, DPT, OCS  ,, PT 09/12/2023, 7:59 PM

## 2023-09-14 ENCOUNTER — Ambulatory Visit

## 2023-09-14 DIAGNOSIS — M25562 Pain in left knee: Secondary | ICD-10-CM | POA: Diagnosis not present

## 2023-09-14 DIAGNOSIS — R262 Difficulty in walking, not elsewhere classified: Secondary | ICD-10-CM

## 2023-09-14 DIAGNOSIS — M25662 Stiffness of left knee, not elsewhere classified: Secondary | ICD-10-CM

## 2023-09-14 NOTE — Therapy (Signed)
 OUTPATIENT PHYSICAL THERAPY LOWER EXTREMITY TREATMENT   Patient Name: Eric English. MRN: [id number] DOB:1955/09/29, 68 y.o., male Today's Date: 09/14/2023  END OF SESSION:  PT End of Session - 09/14/23 1010     Visit Number 3    Number of Visits 17    Date for PT Re-Evaluation 11/02/23    Authorization Type 2x/week x 8 weeks (re-cert needed 7/82/95)    PT Start Time 0759    PT Stop Time 0852    PT Time Calculation (min) 53 min    Activity Tolerance Patient tolerated treatment well    Behavior During Therapy [facility] for tasks assessed/performed              Past Medical History:  Diagnosis Date   Abnormal glucose    Allergy    pollen   Arthritis    hands / knees   Hemorrhoids    Hypertension    Prostate enlargement    Sleep apnea    Trigger finger    Past Surgical History:  Procedure Laterality Date   COLONOSCOPY  06/16/2004   COLONOSCOPY WITH PROPOFOL N/A 08/15/2015   Procedure: COLONOSCOPY WITH PROPOFOL;  Surgeon: Earline Mayotte, MD;  Location: [facility] ENDOSCOPY;  Service: Endoscopy;  Laterality: N/A;   HERNIA REPAIR  06/17/2007   umbilical   VASECTOMY  06/16/1980   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   Elevated liver enzymes 10/08/2015   Elevated PSA 07/11/2015   Superficial thrombophlebitis 06/23/2015   Diastasis recti 12/27/2013   Benign essential HTN 03/15/2010    PCP: Dr. Carlynn Purl, MD  REFERRING PROVIDER: Dr.  Swazil[place], MD  REFERRING DIAG: s/p hemiarthroplasty L knee (medial compartment)  THERAPY DIAG:  Acute pain of left knee  Stiffness of left knee, not elsewhere classified  Difficulty in walking, not elsewhere classified  Rationale for Evaluation and Treatment: Rehabilitation  ONSET DATE: 09/04/23  SUBJECTIVE: (from initial evaluation note 09/07/23)  SUBJECTIVE STATEMENT: Pt underwent left medial unicompartmental knee arthroplasty on 09/04/23.  He had surgery and then d/c home same day.  Had surgery and then came home from [facility]    C/o tightness/pain in knee, difficulty walking  Has a HEP and worked on it over the weekend   Pain control: Tramadol, pain medication and ice pack, also using a heating pad some pad behind knee knee too    Pt using FWW currently  Pt's wife is present today; has good support- lives part time locally and part time in [place] (for a long time) due to his work  PERTINENT HISTORY: Decided to have surgery b/c of OA; he is very active, was in the [employer] for many years prior to this.  PAIN:  Are you having pain? Best 5/10 and worst 10/10   PRECAUTIONS: None  RED FLAGS: None   WEIGHT BEARING RESTRICTIONS: No  FALLS:  Has patient fallen in last 6 months? 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  DIAGNOSTIC FINDINGS: post op  PATIENT SURVEYS:  LEFS 20  COGNITION: Overall cognitive status: Within functional limits  for tasks assessed     SENSATION: WFL  EDEMA:  Typical postop swelling of L LE noted   POSTURE: Pt's FWW appears slightly tall, shoulders shrugged when amb  PALPATION: TTP throughout L knee  LOWER EXTREMITY ROM:  Active ROM Right eval Left eval  Hip flexion    Hip extension    Hip abduction    Hip adduction    Hip internal rotation    Hip external rotation    Knee flexion  80-85  Knee extension  Lacking 5-10  Ankle dorsiflexion    Ankle plantarflexion    Ankle inversion    Ankle eversion     (Blank rows = not tested)  LOWER EXTREMITY MMT:  MMT Right eval Left eval  Hip flexion    Hip extension    Hip abduction    Hip adduction    Hip internal rotation    Hip external rotation    Knee flexion  3+  Knee extension  3+  Ankle dorsiflexion    Ankle plantarflexion    Ankle inversion    Ankle eversion     (Blank rows = not tested)   FUNCTIONAL TESTS:  Pt able to transfer sit to stand and sit to supine independently; uses UE support for sit to stand  GAIT: Distance walked: in/out of clinic Assistive device utilized: Walker - 2 wheeled Level of assistance: Complete Independence Comments: pt with decreased WB on L LE, decreased knee flex during swing phase on L                                                                                                                                TREATMENT DATE: 09/14/23  Subjective: " I am tight and have pain in my knee. My calf is swollen"  Objective: Pain 5/10 knee Knee swelling observed upon arrival, redness in lower anterior aspects of the tibia  Manual : 4 mins not charged IFC E/S x 20 min during the exs not change. Patellar glides med/lat/ Sup/INf x 60 secs Massage to lower leg  Manual ext and flex stretch Achieve 2 to 95 deg   TA: 10 mins Pt education/training about Massage away ( not on the incision), Patellar glides training and calf stretch training for HEP.  Therapeutic Exercises: 43  Calf and  soleus stretch 2  x 60 secs hold using Towel  Quad sets 2 x 10 reps 5 secs hold while roll under heel  Knee extension heel propped x 3 minutes Hamstring sets 2 x 10 reps TKE 2 x 10 reps hold x 5 secs  SLR 2 x 10 reps FAQ 2 x 10 reps Hamstring curls 1 x 10 reps  STS 1 x 10 reps Mini wall slides  up to 30 deg of knee flex 1x 10 reps Heel raises 25 reps      HEP instruction/pt education (see below)   Not today: Manual knee PROM knee flexion/extension with pt in supine hooklying x 7 min Gentle knee extension manual stretch with foot propped on bolster x 3 minutes Partial range SLR with PT assist: x15 MRE hip abd/add (knee extended) with PT 2x10 Cold pack in elevated position x 10 min at end, no charge  Pt education about importance of continuing to manage post-op swelling and pain and not "overdoing it" with his HEP; discussed strategies for elevation with cold pack  PATIENT EDUCATION:  Education details: PT POC/goals, HEP, strategies for managing swelling/pain with elevation and cryotherapy, not overdoing it in early post-op phase Person educated: Patient and Spouse Education method: Explanation, Demonstration, Tactile cues, Verbal cues, and Handouts Education comprehension: verbalized understanding and returned demonstration  HOME EXERCISE PROGRAM: Access Code: [email or web address] URL: [email or web address] Date: 09/14/2023 Prepared by: Janet Berlin  Exercises - Long Sitting Calf Stretch with Strap  - 3 x daily - 7 x weekly - 1 sets - 2 reps - 60 hold - Long Sitting Soleus Stretch on Bolster with Strap  - 3 x daily - 7 x weekly - 1 sets - 2 reps - 60secs hold - Supine Active Straight Leg Raise  - 1 x daily - 7 x weekly - 3 sets - 10 reps - Heel Raises with Counter Support  - 1 x daily - 7 x weekly - 3 sets - 10 reps - Seated Long Arc Quad  - 1 x daily - 7 x weekly - 3 sets - 10 reps - Supine Quad Set  - 1 x daily - 7 x weekly - 3 sets - 10 reps   ASSESSMENT:  CLINICAL  IMPRESSION: Patient arrived with high pain level 5/10 noted and knee visibly swollen with lower ant leg redness. Homans test negative however, pt has edem ain entire leg. PT educated to monitor for redness, pain and throbbing in LLE. IF so needs to go to urgent are or ER to r/o DVT.  Pt demonstrated good understanding. Pt benefited from IFC during stretches and Jt mobs. Knee ext showed improvement by the end of the session to lack of 2 degrees from 7 deg at the start of the session. Pt provided with updated HEP. Today Knee range 2-95. He is in the early post-operative recovery phase (surgery 09/04/23) and he should do well with PT; educated pt on tissue/acute healing phase and importance of managing swelling and pain and not overdoing physical activity during this first week for optimal long term outcome.  OBJECTIVE IMPAIRMENTS: Abnormal gait, decreased activity tolerance, decreased endurance, decreased mobility, difficulty walking, decreased ROM, decreased strength, impaired perceived functional ability, and pain.   ACTIVITY LIMITATIONS: carrying, lifting, bending, sitting, standing, squatting, sleeping, stairs, transfers, and locomotion level  PARTICIPATION LIMITATIONS: meal prep, cleaning, interpersonal relationship, driving, community activity, occupation, and yard work  PERSONAL FACTORS: Age and Time since onset of injury/illness/exacerbation are also affecting patient's functional outcome.   REHAB POTENTIAL: Excellent  CLINICAL DECISION MAKING: Stable/uncomplicated  EVALUATION COMPLEXITY: Low   GOALS: Goals reviewed with patient? Yes  SHORT TERM GOALS: Target date: 09/29/23 Pt will amb with achieving L heel strike and normal swing through gait mechanics on L LE with least restrictive AD x 10 min on flat surfaces Baseline: heel strike impaired, knee flex impaired Goal status: INITIAL   LONG TERM GOALS: Target date: 11/02/23  Improve LEFS >18 (MDC 9) indicating pt able to perform his  daily and work activities without being as limited by L knee Baseline: 20 Goal status: INITIAL  2.  Pt's L knee AROM will improve 0-120 to facilitate being able to amb, ascend/descend stairs, ride in car, get up/down from sitting with normal LE mechanics and not being limited by L knee tightness Baseline: flexion 85 Goal status: INITIAL  3.  Improve L LE strength 1 MMT grade to facilitate being able to walk x 20 minutes with least restrictive or no AD to resume fishing, light yardwork/housework independently Baseline: 3+/5 Goal status: INITIAL    PLAN:  PT FREQUENCY: 2x/week  PT DURATION: 8 weeks  PLANNED INTERVENTIONS: 97110-Therapeutic exercises, 97530- Therapeutic activity, 97112- Neuromuscular re-education, 97535- Self Care, and 16109- Manual therapy  PLAN FOR NEXT SESSION: ROM for L knee, quad mm training   Janet Berlin PT DPT 10:12 AM,09/14/23

## 2023-09-15 HISTORY — PX: MEDIAL PARTIAL KNEE REPLACEMENT: SHX5965

## 2023-09-16 NOTE — Therapy (Signed)
 OUTPATIENT PHYSICAL THERAPY LOWER EXTREMITY TREATMENT   Patient Name: Eric English. MRN: [id number] DOB:11-28-1955, 68 y.o., male Today's Date: 09/17/2023  END OF SESSION:  PT End of Session - 09/17/23 0759     Visit Number 4    Number of Visits 17    Date for PT Re-Evaluation 11/02/23    Authorization Type 2x/week x 8 weeks (re-cert needed 09/23/79)    PT Start Time 0800    PT Stop Time 0845    PT Time Calculation (min) 45 min    Activity Tolerance Patient tolerated treatment well    Behavior During Therapy [facility] for tasks assessed/performed            Past Medical History:  Diagnosis Date   Abnormal glucose    Allergy    pollen   Arthritis    hands / knees   Hemorrhoids    Hypertension    Prostate enlargement    Sleep apnea    Trigger finger    Past Surgical History:  Procedure Laterality Date   COLONOSCOPY  06/16/2004   COLONOSCOPY WITH PROPOFOL N/A 08/15/2015   Procedure: COLONOSCOPY WITH PROPOFOL;  Surgeon: Earline Mayotte, MD;  Location: [facility] ENDOSCOPY;  Service: Endoscopy;  Laterality: N/A;   HERNIA REPAIR  06/17/2007   umbilical   VASECTOMY  06/16/1980   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   Elevated liver enzymes 10/08/2015   Elevated PSA 07/11/2015   Superficial thrombophlebitis 06/23/2015   Diastasis recti 12/27/2013   Benign essential HTN 03/15/2010    PCP: Dr. Carlynn Purl, MD  REFERRING PROVIDER: Dr. Swaziland,  MD  REFERRING DIAG: s/p hemiarthroplasty L knee (medial compartment)  THERAPY DIAG:  Acute pain of left knee  Stiffness of left knee, not elsewhere classified  Rationale for Evaluation and Treatment: Rehabilitation  ONSET DATE: 09/04/23  SUBJECTIVE: (from initial evaluation note 09/07/23)  SUBJECTIVE STATEMENT: Pt underwent left medial unicompartmental knee arthroplasty on 09/04/23.  He had surgery and then d/c home same day.  Had surgery and then came home from [facility].    C/o tightness/pain in knee, difficulty walking  Has a HEP and worked on it over the weekend   Pain control: Tramadol, pain medication and ice pack, also using a heating pad some pad behind knee knee too    Pt using FWW currently  Pt's wife is present today; has good support- lives part time locally and part time in [place] (for a long time) due to his work  PERTINENT HISTORY: Decided to have surgery b/c of OA; he is very active, was in the [employer] for many years prior to this.   PAIN: Are you having pain? Best 5/10 and worst 10/10   PRECAUTIONS: None  RED FLAGS: None   WEIGHT BEARING RESTRICTIONS: No  FALLS: Has patient fallen in last 6 months? No  LIVING ENVIRONMENT: Lives with: lives with their spouse Lives in: House/apartment Stairs: 3 steps to enter,  14 stairs inside to  second floor for bedroom; in [place] he has 2 steps in and then single level once inside Has following equipment at home: Dan Humphreys - 2 wheeled  OCCUPATION: desk job; Arts development officer in [place]   PLOF: Independent  PATIENT GOALS: to return to riding a bike, walk, fishing, get back to work- hoping by 10/19/23  NEXT MD VISIT: 09/21/23; 10/12/23  OBJECTIVE:  Note: Objective measures were completed at Evaluation unless otherwise noted.  DIAGNOSTIC FINDINGS: post op  PATIENT SURVEYS: LEFS 20  COGNITION:Overall cognitive status: Within functional limits for tasks assessed     SENSATION:WFL  EDEMA: Typical postop  swelling of L LE noted   POSTURE: Pt's FWW appears slightly tall, shoulders shrugged when amb  PALPATION: TTP throughout L knee  LOWER EXTREMITY ROM:  Active ROM Right eval Left eval  Hip flexion    Hip extension    Hip abduction    Hip adduction    Hip internal rotation    Hip external rotation    Knee flexion  80-85  Knee extension  Lacking 5-10  Ankle dorsiflexion    Ankle plantarflexion    Ankle inversion    Ankle eversion     (Blank rows = not tested)  LOWER EXTREMITY MMT:  MMT Right eval Left eval  Hip flexion    Hip extension    Hip abduction    Hip adduction    Hip internal rotation    Hip external rotation    Knee flexion  3+  Knee extension  3+  Ankle dorsiflexion    Ankle plantarflexion    Ankle inversion    Ankle eversion     (Blank rows = not tested)   FUNCTIONAL TESTS:  Pt able to transfer sit to stand and sit to supine independently; uses UE support for sit to stand  GAIT: Distance walked: in/out of clinic Assistive device utilized: Walker - 2 wheeled Level of assistance: Complete Independence Comments: pt with decreased WB on L LE, decreased knee flex during swing phase on L                                                                                                                                TREATMENT DATE: 09/14/23    Subjective: Pt reports that he is doing well today. He reports 1-2/10 L knee pain upon arrival. LLE has continued swelling however swelling resolves overnight when supine. Denies SOB or calf pain. He has not used any compression stocking since surgery. He has continued icing regularly.   Pain: 1-2/10 knee   Manual Therapy  Supine L knee extension stretch with heel on bolster and overpressure from therapist to pt tolerance 10s hold x 3; L pateller mobilizations superior, inferior, medial, and lateral, grade III, 20s/bout x 2 bouts each direction; Supine L heel slide progressing through greater degrees of  flexion, at each end range therapist performs tibia on femur AP mobilizations, grade II, 20s/bout x 6  bouts total; Supine L hamstring stretch by therapist x 45s;   Ther-ex  NuStep L0-1 with therapist gradually progressing seat forward to encourage L knee flexion x 10 minutes during interval history; Practiced gait in rehab gym with single point cane, instruction about proper sequencing Standing L knee flexion stretch on second step of staircase 60s hold x 2; Supine L quad sets 3s hold x 5; Supine L SLR initiating with quad set x 10; Supine L heel slide with manually resisted extension by therapist x 10; Supine L SAQ over bolster with therapist resistance x 10; HEP updated and reviewed with patient while ice applied to L knee;  L knee AAROM: -10 to 106 degrees with overpressure from therapist;   PATIENT EDUCATION:  Education details: PT POC, updated HEP, regular icing, exercise form/technique;  Person educated: Patient Education method: Explanation, Demonstration, Tactile cues, Verbal cues, and Handouts Education comprehension: verbalized understanding and returned demonstration  HOME EXERCISE PROGRAM: Access Code: [email or web address] URL: [email or web address] Date: 09/17/2023 Prepared by: Ria Comment  Exercises - Quad Setting and Stretching  - 2-3 x daily - 7 x weekly - 3 sets - 10 reps - 5s hold - Supine Active Straight Leg Raise (Mirrored)  - 2-3 x daily - 7 x weekly - 3 sets - 10 reps - Seated Long Arc Quad (Mirrored)  - 2-3 x daily - 7 x weekly - 3 sets - 10 reps - Seated Knee Flexion Stretch (Mirrored)  - 2-3 x daily - 7 x weekly - start 2-3 minutes and progress until you can hold stretch for approximately 20 minutes hold - Seated Passive Knee Extension  - 2-3 x daily - 7 x weekly - start 2-3 minutes and progress until you can hold stretch for approximately 20 minutes hold - Mini Squat with Counter Support  - 2-3 x daily - 7 x weekly - 3 sets - 10 reps - Heel Raises  with Counter Support  - 2-3 x daily - 7 x weekly - 3 sets - 10 reps - 3s hold   ASSESSMENT:  CLINICAL IMPRESSION: Progressed manual therapy and strengthening during session. Introduced additional stretches and joint mobilizations to improve range of motion. He continues with moderate swelling in LLE however reports full resolution after sleeping overnight in supine position. He denies any calf pain and no excessive redness noted. He reports compliance with ASA for VTE since surgery. AAROM is -10 (lacking 10) to 106 degrees after manual techniques. Updated HEP with heavy focus on low load long duration stretching at home. He demonstrates good stability during session today with single point cane and is safe to transition at home. Pt encouraged to follow-up with ortho surgeon next week as scheduled and return for PT as well. He will benefit from PT services to address deficits in strength, range of motion, pain, balance, and mobility in order to return to full function at home.   OBJECTIVE IMPAIRMENTS: Abnormal gait, decreased activity tolerance, decreased endurance, decreased mobility, difficulty walking, decreased ROM, decreased strength, impaired perceived functional ability, and pain.   ACTIVITY LIMITATIONS: carrying, lifting, bending, sitting, standing, squatting, sleeping, stairs, transfers, and locomotion level  PARTICIPATION LIMITATIONS: meal prep, cleaning, interpersonal relationship, driving, community activity, occupation, and yard work  PERSONAL FACTORS: Age and Time since onset of injury/illness/exacerbation are also affecting patient's functional outcome.   REHAB POTENTIAL: Excellent  CLINICAL DECISION MAKING: Stable/uncomplicated  EVALUATION COMPLEXITY: Low   GOALS: Goals reviewed with patient? Yes  SHORT TERM GOALS: Target date: 09/29/23 Pt will amb with  achieving L heel strike and normal swing through gait mechanics on L LE with least restrictive AD x 10 min on flat  surfaces Baseline: heel strike impaired, knee flex impaired Goal status: INITIAL   LONG TERM GOALS: Target date: 11/02/23  Improve LEFS >18 (MDC 9) indicating pt able to perform his daily and work activities without being as limited by L knee Baseline: 20 Goal status: INITIAL  2.  Pt's L knee AROM will improve 0-120 to facilitate being able to amb, ascend/descend stairs, ride in car, get up/down from sitting with normal LE mechanics and not being limited by L knee tightness Baseline: flexion 85 Goal status: INITIAL  3.  Improve L LE strength 1 MMT grade to facilitate being able to walk x 20 minutes with least restrictive or no AD to resume fishing, light yardwork/housework independently Baseline: 3+/5 Goal status: INITIAL    PLAN:  PT FREQUENCY: 2x/week  PT DURATION: 8 weeks  PLANNED INTERVENTIONS: 97110-Therapeutic exercises, 97530- Therapeutic activity, 97112- Neuromuscular re-education, 97535- Self Care, and 42595- Manual therapy  PLAN FOR NEXT SESSION: manual techniques and strengthening, progress ROM;    Sharalyn Ink Torrez Renfroe PT, DPT, GCS  6:57 PM,09/17/23

## 2023-09-17 ENCOUNTER — Ambulatory Visit

## 2023-09-17 DIAGNOSIS — M6281 Muscle weakness (generalized): Secondary | ICD-10-CM | POA: Diagnosis present

## 2023-09-17 DIAGNOSIS — M25562 Pain in left knee: Secondary | ICD-10-CM | POA: Diagnosis present

## 2023-09-17 DIAGNOSIS — M25662 Stiffness of left knee, not elsewhere classified: Secondary | ICD-10-CM | POA: Diagnosis present

## 2023-09-17 DIAGNOSIS — R262 Difficulty in walking, not elsewhere classified: Secondary | ICD-10-CM | POA: Insufficient documentation

## 2023-09-21 ENCOUNTER — Encounter

## 2023-09-23 ENCOUNTER — Ambulatory Visit

## 2023-09-23 DIAGNOSIS — M25562 Pain in left knee: Secondary | ICD-10-CM

## 2023-09-23 DIAGNOSIS — M25662 Stiffness of left knee, not elsewhere classified: Secondary | ICD-10-CM

## 2023-09-23 NOTE — Therapy (Signed)
 OUTPATIENT PHYSICAL THERAPY LOWER EXTREMITY TREATMENT   Patient Name: Eric English. MRN: [id number] DOB:Jun 18, 1955, 68 y.o., male Today's Date: 09/23/2023  END OF SESSION:  PT End of Session - 09/23/23 0849     Visit Number 5    Number of Visits 17    Date for PT Re-Evaluation 11/02/23    Authorization Type 2x/week x 8 weeks (re-cert needed 09/16/45)    PT Start Time 0850    PT Stop Time 0930    PT Time Calculation (min) 40 min    Activity Tolerance Patient tolerated treatment well    Behavior During Therapy [facility] for tasks assessed/performed            Past Medical History:  Diagnosis Date   Abnormal glucose    Allergy    pollen   Arthritis    hands / knees   Hemorrhoids    Hypertension    Prostate enlargement    Sleep apnea    Trigger finger    Past Surgical History:  Procedure Laterality Date   COLONOSCOPY  06/16/2004   COLONOSCOPY WITH PROPOFOL N/A 08/15/2015   Procedure: COLONOSCOPY WITH PROPOFOL;  Surgeon: Earline Mayotte, MD;  Location: [facility] ENDOSCOPY;  Service: Endoscopy;  Laterality: N/A;   HERNIA REPAIR  06/17/2007   umbilical   VASECTOMY  06/16/1980   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   Elevated liver enzymes 10/08/2015   Elevated PSA 07/11/2015   Superficial thrombophlebitis 06/23/2015   Diastasis recti 12/27/2013   Benign essential HTN 03/15/2010    PCP: Dr. Carlynn Purl, MD  REFERRING PROVIDER: Dr. Swaziland,  MD  REFERRING DIAG: s/p hemiarthroplasty L knee (medial compartment)  THERAPY DIAG:  Acute pain of left knee  Stiffness of left knee, not elsewhere classified  Rationale for Evaluation and Treatment: Rehabilitation  ONSET DATE: 09/04/23  SUBJECTIVE: (from initial evaluation note 09/07/23)  SUBJECTIVE STATEMENT: Pt underwent left medial unicompartmental knee arthroplasty on 09/04/23.  He had surgery and then d/c home same day.  Had surgery and then came home from [facility].    C/o tightness/pain in knee, difficulty walking  Has a HEP and worked on it over the weekend   Pain control: Tramadol, pain medication and ice pack, also using a heating pad some pad behind knee knee too    Pt using FWW currently  Pt's wife is present today; has good support- lives part time locally and part time in [place] (for a long time) due to his work  PERTINENT HISTORY: Decided to have surgery b/c of OA; he is very active, was in the [employer] for many years prior to this.   PAIN: Are you having pain? Best 5/10 and worst 10/10   PRECAUTIONS: None  RED FLAGS: None   WEIGHT BEARING RESTRICTIONS: No  FALLS: Has patient fallen in last 6 months? No  LIVING ENVIRONMENT: Lives with: lives with their spouse Lives in: House/apartment Stairs: 3 steps to enter,  14 stairs inside to  second floor for bedroom; in [place] he has 2 steps in and then single level once inside Has following equipment at home: Dan Humphreys - 2 wheeled  OCCUPATION: desk job; Arts development officer in [place]   PLOF: Independent  PATIENT GOALS: to return to riding a bike, walk, fishing, get back to work- hoping by 10/19/23  NEXT MD VISIT: 09/21/23; 10/12/23  OBJECTIVE:  Note: Objective measures were completed at Evaluation unless otherwise noted.  DIAGNOSTIC FINDINGS: post op  PATIENT SURVEYS: LEFS 20  COGNITION:Overall cognitive status: Within functional limits for tasks assessed     SENSATION:WFL  EDEMA: Typical postop  swelling of L LE noted   POSTURE: Pt's FWW appears slightly tall, shoulders shrugged when amb  PALPATION: TTP throughout L knee  LOWER EXTREMITY ROM:  Active ROM Right eval Left eval  Hip flexion    Hip extension    Hip abduction    Hip adduction    Hip internal rotation    Hip external rotation    Knee flexion  80-85  Knee extension  Lacking 5-10  Ankle dorsiflexion    Ankle plantarflexion    Ankle inversion    Ankle eversion     (Blank rows = not tested)  LOWER EXTREMITY MMT:  MMT Right eval Left eval  Hip flexion    Hip extension    Hip abduction    Hip adduction    Hip internal rotation    Hip external rotation    Knee flexion  3+  Knee extension  3+  Ankle dorsiflexion    Ankle plantarflexion    Ankle inversion    Ankle eversion     (Blank rows = not tested)   FUNCTIONAL TESTS:  Pt able to transfer sit to stand and sit to supine independently; uses UE support for sit to stand  GAIT: Distance walked: in/out of clinic Assistive device utilized: Walker - 2 wheeled Level of assistance: Complete Independence Comments: pt with decreased WB on L LE, decreased knee flex during swing phase on L                                                                                                                                TREATMENT DATE: 09/23/23    Subjective: Pt reports that he is doing well today. He reports 3/10 L knee pain upon arrival. He saw the surgeon on Monday who was pleased with his progress. He has continued icing regularly. No specific concerns today.   Pain: 3/10 L knee   Manual Therapy  Supine L knee extension stretch with heel on bolster and overpressure from therapist to pt tolerance 10s hold x 2; L pateller mobilizations superior, inferior, medial, and lateral, grade III, 20s/bout x 1 bout each direction; Supine L heel slide progressing through greater degrees of flexion, at each end range therapist performs tibia on femur AP  mobilizations, grade II, 20s/bout x 6 bouts total; Supine L hamstring stretch by  therapist x 45s;   Ther-ex  NuStep L0-2 with therapist gradually progressing seat forward (starting position 11, ending at position 8) to encourage L knee flexion x 10 minutes during interval history; Standing L knee flexion stretch on second step of staircase x 60s; 6" forward step-ups with BUE leading with LLE x 15; 6" R lateral step downs to 2" Airex pad x 15; Total Gym Level 22 double leg squats with end range knee flexion hold 2 x 15; TG L22 double leg heel raises 2 x 15; Standing L TKE with blue tband x 10;   PATIENT EDUCATION:  Education details: Pt educated throughout session about proper posture and technique with exercises. Improved exercise technique, movement at target joints, use of target muscles after min to mod verbal, visual, tactile cues.  Person educated: Patient Education method: Explanation, Demonstration, Tactile cues, Verbal cues, and Handouts Education comprehension: verbalized understanding and returned demonstration  HOME EXERCISE PROGRAM: Access Code: [email or web address] URL: [email or web address] Date: 09/17/2023 Prepared by: Ria Comment  Exercises - Quad Setting and Stretching  - 2-3 x daily - 7 x weekly - 3 sets - 10 reps - 5s hold - Supine Active Straight Leg Raise (Mirrored)  - 2-3 x daily - 7 x weekly - 3 sets - 10 reps - Seated Long Arc Quad (Mirrored)  - 2-3 x daily - 7 x weekly - 3 sets - 10 reps - Seated Knee Flexion Stretch (Mirrored)  - 2-3 x daily - 7 x weekly - start 2-3 minutes and progress until you can hold stretch for approximately 20 minutes hold - Seated Passive Knee Extension  - 2-3 x daily - 7 x weekly - start 2-3 minutes and progress until you can hold stretch for approximately 20 minutes hold - Mini Squat with Counter Support  - 2-3 x daily - 7 x weekly - 3 sets - 10 reps - Heel Raises with Counter Support  - 2-3 x daily - 7 x weekly - 3 sets - 10  reps - 3s hold   ASSESSMENT:  CLINICAL IMPRESSION: Progressed manual therapy and strengthening during session. His range of motion appears better today for both flexion and extension and plan is to measure at next session. No HEP updates at this time. He reports ambulating around the house occasionally without single point cane and entire session spent ambulating without assistive device. No instability or buckling noted. Pt encouraged to follow-up with ortho surgeon next week as scheduled and return for PT as well. He will benefit from PT services to address deficits in strength, range of motion, pain, balance, and mobility in order to return to full function at home.   OBJECTIVE IMPAIRMENTS: Abnormal gait, decreased activity tolerance, decreased endurance, decreased mobility, difficulty walking, decreased ROM, decreased strength, impaired perceived functional ability, and pain.   ACTIVITY LIMITATIONS: carrying, lifting, bending, sitting, standing, squatting, sleeping, stairs, transfers, and locomotion level  PARTICIPATION LIMITATIONS: meal prep, cleaning, interpersonal relationship, driving, community activity, occupation, and yard work  PERSONAL FACTORS: Age and Time since onset of injury/illness/exacerbation are also affecting patient's functional outcome.   REHAB POTENTIAL: Excellent  CLINICAL DECISION MAKING: Stable/uncomplicated  EVALUATION COMPLEXITY: Low   GOALS: Goals reviewed with patient? Yes  SHORT TERM GOALS: Target date: 09/29/23 Pt will amb with achieving L heel strike and normal swing through gait mechanics on L LE with least restrictive AD x 10 min on flat surfaces Baseline: heel strike impaired, knee flex impaired Goal status: INITIAL   LONG TERM GOALS: Target date: 11/02/23  Improve LEFS >18 (MDC 9) indicating pt able to perform his daily and work activities without being as limited by L knee Baseline: 20 Goal status: INITIAL  2.  Pt's L knee AROM will improve  0-120 to facilitate being able to amb, ascend/descend stairs, ride in car, get up/down from sitting with normal LE mechanics and not being limited by L knee tightness Baseline: flexion 85 Goal status: INITIAL  3.  Improve L LE strength 1 MMT grade to facilitate being able to walk x 20 minutes with least restrictive or no AD to resume fishing, light yardwork/housework independently Baseline: 3+/5 Goal status: INITIAL   PLAN:  PT FREQUENCY: 2x/week  PT DURATION: 8 weeks  PLANNED INTERVENTIONS: 97110-Therapeutic exercises, 97530- Therapeutic activity, 97112- Neuromuscular re-education, 97535- Self Care, and 16109- Manual therapy  PLAN FOR NEXT SESSION: manual techniques and strengthening, progress ROM;    Rivaan Kendall D Lyndee Herbst PT, DPT, GCS  1:45 PM,09/23/23

## 2023-09-25 ENCOUNTER — Encounter

## 2023-09-25 NOTE — Therapy (Signed)
 OUTPATIENT PHYSICAL THERAPY LOWER EXTREMITY TREATMENT   Patient Name: Eric English. MRN: [id number] DOB:December 02, 1955, 68 y.o., male Today's Date: 09/28/2023  END OF SESSION:  PT End of Session - 09/28/23 1149     Visit Number 6    Number of Visits 17    Date for PT Re-Evaluation 11/02/23    Authorization Type 2x/week x 8 weeks (re-cert needed 4/78/29)    PT Start Time 1146    PT Stop Time 1230    PT Time Calculation (min) 44 min    Activity Tolerance Patient tolerated treatment well    Behavior During Therapy [facility] for tasks assessed/performed            Past Medical History:  Diagnosis Date   Abnormal glucose    Allergy    pollen   Arthritis    hands / knees   Hemorrhoids    Hypertension    Prostate enlargement    Sleep apnea    Trigger finger    Past Surgical History:  Procedure Laterality Date   COLONOSCOPY  06/16/2004   COLONOSCOPY WITH PROPOFOL N/A 08/15/2015   Procedure: COLONOSCOPY WITH PROPOFOL;  Surgeon: Marshall Skeeter, MD;  Location: [facility] ENDOSCOPY;  Service: Endoscopy;  Laterality: N/A;   HERNIA REPAIR  06/17/2007   umbilical   VASECTOMY  06/16/1980   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   Elevated liver enzymes 10/08/2015   Elevated PSA 07/11/2015   Superficial thrombophlebitis 06/23/2015   Diastasis recti 12/27/2013   Benign essential HTN 03/15/2010    PCP: Dr. Ava Lei, MD  REFERRING PROVIDER: Dr. Swaziland,  MD  REFERRING DIAG: s/p hemiarthroplasty L knee (medial compartment)  THERAPY DIAG:  Stiffness of left knee, not elsewhere classified  Difficulty in walking, not elsewhere classified  Muscle weakness (generalized)  Rationale for Evaluation and Treatment: Rehabilitation  ONSET DATE: 09/04/23  SUBJECTIVE: (from initial evaluation note 09/07/23)  SUBJECTIVE STATEMENT: Pt underwent left medial unicompartmental knee arthroplasty on 09/04/23.  He had surgery and then d/c home same day.  Had surgery and then came home from [facility].    C/o tightness/pain in knee, difficulty walking  Has a HEP and worked on it over the weekend   Pain control: Tramadol, pain medication and ice pack, also using a heating pad some pad behind knee knee too    Pt using FWW currently  Pt's wife is present today; has good support- lives part time locally and part time in [place] (for a long time) due to his work  PERTINENT HISTORY: Decided to have surgery b/c of OA; he is very active, was in the [employer] for many years prior to this.   PAIN: Are you having pain? Best 5/10 and worst 10/10   PRECAUTIONS: None  RED FLAGS: None   WEIGHT BEARING RESTRICTIONS: No  FALLS: Has patient fallen in last 6 months? No  LIVING ENVIRONMENT: Lives with: lives with their spouse Lives in: House/apartment Stairs: 3 steps to enter,  14 stairs inside to second floor for bedroom; in [place] he has 2 steps in and then single level once inside Has following equipment at home: Otho Blitz - 2 wheeled  OCCUPATION: desk job; Arts development officer in [place]   PLOF: Independent  PATIENT GOALS: to return to riding a bike, walk, fishing, get back to work- hoping by 10/19/23  NEXT MD VISIT: 09/21/23; 10/12/23  OBJECTIVE:  Note: Objective measures were completed at Evaluation unless otherwise noted.  DIAGNOSTIC FINDINGS: post op  PATIENT SURVEYS: LEFS 20  COGNITION:Overall cognitive status: Within functional limits for tasks  assessed     SENSATION:WFL  EDEMA: Typical postop swelling of L LE noted   POSTURE: Pt's FWW appears slightly tall, shoulders shrugged when amb  PALPATION: TTP throughout L knee  LOWER EXTREMITY ROM:  Active ROM Right eval Left eval  Hip flexion    Hip extension    Hip abduction    Hip adduction    Hip internal rotation    Hip external rotation    Knee flexion  80-85  Knee extension  Lacking 5-10  Ankle dorsiflexion    Ankle plantarflexion    Ankle inversion    Ankle eversion     (Blank rows = not tested)  LOWER EXTREMITY MMT:  MMT Right eval Left eval  Hip flexion    Hip extension    Hip abduction    Hip adduction    Hip internal rotation    Hip external rotation    Knee flexion  3+  Knee extension  3+  Ankle dorsiflexion    Ankle plantarflexion    Ankle inversion    Ankle eversion     (Blank rows = not tested)   FUNCTIONAL TESTS:  Pt able to transfer sit to stand and sit to supine independently; uses UE support for sit to stand  GAIT: Distance walked: in/out of clinic Assistive device utilized: Walker - 2 wheeled Level of assistance: Complete Independence Comments: pt with decreased WB on L LE, decreased knee flex during swing phase on L                                                                                                                                TREATMENT DATE: 09/28/23    Subjective: Pt reports that he is doing well today. No changes reported since the last therapy session. No resting knee pain reported upon arrival but not asked to rate. No specific concerns today.   Pain: No resting knee pain reported, not asked to rate;   Manual Therapy  Supine L knee extension stretch with heel on bolster and overpressure from therapist to pt tolerance 10s hold x 3; Supine L heel slide progressing through greater degrees of flexion, at each end range therapist performs tibia on femur AP mobilizations, grade II, 20s/bout x 4 bouts  total; Tibia on femur rotation mobilizations at end range, grade II-III, 20s/bout x 2 bouts;  Ther-ex  NuStep L0-2 with therapist gradually progressing seat forward (starting position 11, ending at position 8) to encourage L knee flexion x 10 minutes during interval history; Hooklying clams with manual resistance from therapist  2 x 10; Hooklying adductor squeeze with manual resistance from therapist 2 x 10; Hooklying bridges x 10; Supine L heel slide with manually resisted extension x 10;   Not performed: Standing L knee flexion stretch on second step of staircase x 60s; 6" forward step-ups with BUE leading with LLE x 15; 6" R lateral step downs to 2" Airex pad x 15; Total Gym Level 22 double leg squats with end range knee flexion hold 2 x 15; TG L22 double leg heel raises 2 x 15; Standing L TKE with blue tband x 10;   PATIENT EDUCATION:  Education details: Pt educated throughout session about proper posture and technique with exercises. Improved exercise technique, movement at target joints, use of target muscles after min to mod verbal, visual, tactile cues.  Person educated: Patient Education method: Explanation, Demonstration, Tactile cues, Verbal cues, and Handouts Education comprehension: verbalized understanding and returned demonstration  HOME EXERCISE PROGRAM: Access Code: [email or web address] URL: [email or web address] Date: 09/17/2023 Prepared by: Crawford Dock  Exercises - Quad Setting and Stretching  - 2-3 x daily - 7 x weekly - 3 sets - 10 reps - 5s hold - Supine Active Straight Leg Raise (Mirrored)  - 2-3 x daily - 7 x weekly - 3 sets - 10 reps - Seated Long Arc Quad (Mirrored)  - 2-3 x daily - 7 x weekly - 3 sets - 10 reps - Seated Knee Flexion Stretch (Mirrored)  - 2-3 x daily - 7 x weekly - start 2-3 minutes and progress until you can hold stretch for approximately 20 minutes hold - Seated Passive Knee Extension  - 2-3 x daily - 7 x weekly - start 2-3  minutes and progress until you can hold stretch for approximately 20 minutes hold - Mini Squat with Counter Support  - 2-3 x daily - 7 x weekly - 3 sets - 10 reps - Heel Raises with Counter Support  - 2-3 x daily - 7 x weekly - 3 sets - 10 reps - 3s hold   ASSESSMENT:  CLINICAL IMPRESSION: Progressed manual therapy and strengthening during session. His range of motion continues to appear better today for both flexion and extension and plan is to measure at next session. No HEP updates at this time. Pt encouraged to follow-up as scheduled. He will benefit from PT services to address deficits in strength, range of motion, pain, balance, and mobility in order to return to full function at home.   OBJECTIVE IMPAIRMENTS: Abnormal gait, decreased activity tolerance, decreased endurance, decreased mobility, difficulty walking, decreased ROM, decreased strength, impaired perceived functional ability, and pain.   ACTIVITY LIMITATIONS: carrying, lifting, bending, sitting, standing, squatting, sleeping, stairs, transfers, and locomotion level  PARTICIPATION LIMITATIONS: meal prep, cleaning, interpersonal relationship, driving, community activity, occupation, and yard work  PERSONAL FACTORS: Age and Time since onset of injury/illness/exacerbation are also affecting patient's functional outcome.   REHAB POTENTIAL: Excellent  CLINICAL DECISION MAKING: Stable/uncomplicated  EVALUATION COMPLEXITY: Low   GOALS: Goals reviewed with patient? Yes  SHORT TERM GOALS: Target date: 09/29/23 Pt will amb with achieving L heel strike and normal swing through gait mechanics on L LE with least restrictive AD x 10 min on flat surfaces Baseline: heel strike impaired, knee flex impaired Goal status: INITIAL   LONG TERM GOALS: Target  date: 11/02/23  Improve LEFS >18 (MDC 9) indicating pt able to perform his daily and work activities without being as limited by L knee Baseline: 20 Goal status: INITIAL  2.  Pt's  L knee AROM will improve 0-120 to facilitate being able to amb, ascend/descend stairs, ride in car, get up/down from sitting with normal LE mechanics and not being limited by L knee tightness Baseline: flexion 85 Goal status: INITIAL  3.  Improve L LE strength 1 MMT grade to facilitate being able to walk x 20 minutes with least restrictive or no AD to resume fishing, light yardwork/housework independently Baseline: 3+/5 Goal status: INITIAL   PLAN:  PT FREQUENCY: 2x/week  PT DURATION: 8 weeks  PLANNED INTERVENTIONS: 97110-Therapeutic exercises, 97530- Therapeutic activity, 97112- Neuromuscular re-education, 97535- Self Care, and 16109- Manual therapy  PLAN FOR NEXT SESSION: manual techniques and strengthening, progress ROM;    Sherill Ding Devlon Dosher PT, DPT, GCS  1:06 PM,09/28/23

## 2023-09-28 ENCOUNTER — Ambulatory Visit

## 2023-09-28 DIAGNOSIS — R262 Difficulty in walking, not elsewhere classified: Secondary | ICD-10-CM

## 2023-09-28 DIAGNOSIS — M25662 Stiffness of left knee, not elsewhere classified: Secondary | ICD-10-CM

## 2023-09-28 DIAGNOSIS — M25562 Pain in left knee: Secondary | ICD-10-CM | POA: Diagnosis not present

## 2023-09-28 DIAGNOSIS — M6281 Muscle weakness (generalized): Secondary | ICD-10-CM

## 2023-09-29 NOTE — Therapy (Signed)
 OUTPATIENT PHYSICAL THERAPY LOWER EXTREMITY TREATMENT   Patient Name: Eric English. MRN: [id number] DOB:1956/01/20, 68 y.o., male Today's Date: 10/01/2023  END OF SESSION:  PT End of Session - 09/30/23 0911     Visit Number 7    Number of Visits 17    Date for PT Re-Evaluation 11/02/23    Authorization Type 2x/week x 8 weeks (re-cert needed 7/82/95), [employer] AOZH#[id number] 3/24-5/30 for 19 PT visits  [employer] #[id number] approved for 6 visits between 09/17/2023-11/15/2023  [employer]BS 2025  VL:30 pt only  Deductible: $1000/$307.06 used  Co ins:20%    Authorization - Visit Number 7    Authorization - Number of Visits 19    PT Start Time 0850    PT Stop Time 0930    PT Time Calculation (min) 40 min    Activity Tolerance Patient tolerated treatment well    Behavior During Therapy [facility] for tasks assessed/performed            Past Medical History:  Diagnosis Date   Abnormal glucose    Allergy    pollen   Arthritis    hands / knees   Hemorrhoids    Hypertension    Prostate enlargement    Sleep apnea    Trigger finger    Past Surgical History:  Procedure Laterality Date   COLONOSCOPY  06/16/2004   COLONOSCOPY WITH PROPOFOL N/A 08/15/2015   Procedure: COLONOSCOPY WITH PROPOFOL;  Surgeon: Marshall Skeeter, MD;  Location: [facility] ENDOSCOPY;  Service: Endoscopy;  Laterality: N/A;   HERNIA REPAIR  06/17/2007   umbilical   VASECTOMY  06/16/1980   Patient Active Problem List   Diagnosis Date Noted   Dyslipidemia 09/05/2022   Chronic pain of left knee 09/05/2022   Perennial allergic rhinitis with seasonal variation 03/03/2022   Internal derangement of knee 09/21/2020   Morbid obesity (HCC) 02/27/2020   History of elevated PSA 08/12/2019   OSA on CPAP 08/12/2019   Hyperlipidemia associated with type 2 diabetes mellitus (HCC) 03/18/2019   Chronic left shoulder pain 01/04/2018   Chronic seasonal allergic rhinitis due to pollen 09/19/2016   Hemorrhoids 07/15/2016   Erectile  dysfunction of organic origin 10/08/2015   Elevated liver enzymes 10/08/2015   Elevated PSA 07/11/2015   Superficial thrombophlebitis 06/23/2015   Diastasis recti 12/27/2013   Benign essential HTN 03/15/2010    PCP: Dr. Ava Lei, MD  REFERRING PROVIDER: Dr. Swazil[place], MD  REFERRING DIAG: s/p hemiarthroplasty L knee (medial compartment)  THERAPY DIAG:  Stiffness of left knee, not elsewhere classified  Muscle weakness (generalized)  Rationale for Evaluation and Treatment: Rehabilitation  ONSET DATE: 09/04/23  SUBJECTIVE: (from initial evaluation note 09/07/23)  SUBJECTIVE STATEMENT: Pt underwent left medial unicompartmental knee arthroplasty on 09/04/23.  He had surgery and then d/c home same day.  Had surgery and then came [facility] from [facility].    C/o tightness/pain in knee, difficulty walking  Has a HEP and worked on it over the weekend   Pain control: Tramadol, pain medication and ice pack, also using a heating pad some pad behind knee knee too    Pt using FWW currently  Pt's wife is present today; has good support- lives part time locally and part time in [place] (for a long time) due to his work  PERTINENT HISTORY: Decided to have surgery b/c of OA; he is very active, was in the [employer] for many years prior to this.   PAIN: Are you having pain? Best 5/10 and worst 10/10  PRECAUTIONS: None  RED FLAGS: None   WEIGHT BEARING RESTRICTIONS: No  FALLS: Has patient fallen in last 6 months? No  LIVING ENVIRONMENT: Lives with: lives with their spouse Lives in: House/apartment Stairs: 3 steps to enter,  14 stairs inside to second floor for bedroom; in [place] he has 2 steps in and then single level once inside Has following equipment at home: Otho Blitz - 2 wheeled  OCCUPATION: desk job; Arts development officer in [place]   PLOF: Independent  PATIENT GOALS: to return to riding a bike, walk, fishing, get back to work- hoping by 10/19/23  NEXT MD VISIT: 09/21/23;  10/12/23  OBJECTIVE:  Note: Objective measures were completed at Evaluation unless otherwise noted.  DIAGNOSTIC FINDINGS: post op  PATIENT SURVEYS: LEFS 20  COGNITION:Overall cognitive status: Within functional limits for tasks assessed     SENSATION:WFL  EDEMA: Typical postop swelling of L LE noted   POSTURE: Pt's FWW appears slightly tall, shoulders shrugged when amb  PALPATION: TTP throughout L knee  LOWER EXTREMITY ROM:  Active ROM Right eval Left eval  Hip flexion    Hip extension    Hip abduction    Hip adduction    Hip internal rotation    Hip external rotation    Knee flexion  80-85  Knee extension  Lacking 5-10  Ankle dorsiflexion    Ankle plantarflexion    Ankle inversion    Ankle eversion     (Blank rows = not tested)  LOWER EXTREMITY MMT:  MMT Right eval Left eval  Hip flexion    Hip extension    Hip abduction    Hip adduction    Hip internal rotation    Hip external rotation    Knee flexion  3+  Knee extension  3+  Ankle dorsiflexion    Ankle plantarflexion    Ankle inversion    Ankle eversion     (Blank rows = not tested)   FUNCTIONAL TESTS:  Pt able to transfer sit to stand and sit to supine independently; uses UE support for sit to stand  GAIT: Distance walked: in/out of clinic Assistive device utilized: Walker - 2 wheeled Level of assistance: Complete Independence Comments: pt with decreased WB on L LE, decreased knee flex during swing phase on L                                                                                                                                TREATMENT DATE: 09/30/23    Subjective: Pt reports that he is doing well today. No changes reported since the last therapy session. No specific concerns today.   Pain: No   Manual Therapy  Supine L knee extension stretch with heel on bolster and overpressure from therapist to pt tolerance 10s hold x 3; Supine L heel slide progressing through greater  degrees of flexion, at each end range therapist performs tibia on femur AP mobilizations, grade II, 20s/bout  x 4 bouts total;   Ther-ex  NuStep L0-2 with therapist gradually progressing seat forward (starting position 11, ending at position 8) to encourage L knee flexion x 10 minutes during interval history; Standing L knee flexion stretch on second step of staircase 2 x 60s; 6" R lateral step downs to 2" Airex pad x 15; 6" step heel raises; Total Gym Level 22 double leg squats 2 x 15; TG L22 R single leg squats x 10; L knee AAROM: -3 to 113;    Not performed: Hooklying clams with manual resistance from therapist  2 x 10; Hooklying adductor squeeze with manual resistance from therapist 2 x 10; Hooklying bridges x 10; Supine L heel slide with manually resisted extension x 10; Standing L TKE with blue tband x 10; Tibia on femur rotation mobilizations at end range, grade II-III, 20s/bout x 2 bouts;   PATIENT EDUCATION:  Education details: Pt educated throughout session about proper posture and technique with exercises. Improved exercise technique, movement at target joints, use of target muscles after min to mod verbal, visual, tactile cues.  Person educated: Patient Education method: Explanation, Demonstration, Tactile cues, Verbal cues, and Handouts Education comprehension: verbalized understanding and returned demonstration  HOME EXERCISE PROGRAM: Access Code: [email or web address] URL: [email or web address] Date: 09/17/2023 Prepared by: Crawford Dock  Exercises - Quad Setting and Stretching  - 2-3 x daily - 7 x weekly - 3 sets - 10 reps - 5s hold - Supine Active Straight Leg Raise (Mirrored)  - 2-3 x daily - 7 x weekly - 3 sets - 10 reps - Seated Long Arc Quad (Mirrored)  - 2-3 x daily - 7 x weekly - 3 sets - 10 reps - Seated Knee Flexion Stretch (Mirrored)  - 2-3 x daily - 7 x weekly - start 2-3 minutes and progress until you can hold stretch for approximately 20 minutes  hold - Seated Passive Knee Extension  - 2-3 x daily - 7 x weekly - start 2-3 minutes and progress until you can hold stretch for approximately 20 minutes hold - Mini Squat with Counter Support  - 2-3 x daily - 7 x weekly - 3 sets - 10 reps - Heel Raises with Counter Support  - 2-3 x daily - 7 x weekly - 3 sets - 10 reps - 3s hold   ASSESSMENT:  CLINICAL IMPRESSION: Progressed manual therapy and strengthening during session. His range of motion continues to improve and measured today at -3 to 113. Demonstrated standing tband resisted hip abduction and extension and encouraged to add to home program. Will update HEP at next visit. Pt encouraged to follow-up as scheduled. He will benefit from PT services to address deficits in strength, range of motion, pain, balance, and mobility in order to return to full function at home.   OBJECTIVE IMPAIRMENTS: Abnormal gait, decreased activity tolerance, decreased endurance, decreased mobility, difficulty walking, decreased ROM, decreased strength, impaired perceived functional ability, and pain.   ACTIVITY LIMITATIONS: carrying, lifting, bending, sitting, standing, squatting, sleeping, stairs, transfers, and locomotion level  PARTICIPATION LIMITATIONS: meal prep, cleaning, interpersonal relationship, driving, community activity, occupation, and yard work  PERSONAL FACTORS: Age and Time since onset of injury/illness/exacerbation are also affecting patient's functional outcome.   REHAB POTENTIAL: Excellent  CLINICAL DECISION MAKING: Stable/uncomplicated  EVALUATION COMPLEXITY: Low   GOALS: Goals reviewed with patient? Yes  SHORT TERM GOALS: Target date: 09/29/23 Pt will amb with achieving L heel strike and normal swing through gait mechanics on L LE with least restrictive AD  x 10 min on flat surfaces Baseline: heel strike impaired, knee flex impaired Goal status: INITIAL   LONG TERM GOALS: Target date: 11/02/23  Improve LEFS >18 (MDC 9)  indicating pt able to perform his daily and work activities without being as limited by L knee Baseline: 20 Goal status: INITIAL  2.  Pt's L knee AROM will improve 0-120 to facilitate being able to amb, ascend/descend stairs, ride in car, get up/down from sitting with normal LE mechanics and not being limited by L knee tightness Baseline: flexion 85; 09/30/23: -3 to 113; Goal status: INITIAL  3.  Improve L LE strength 1 MMT grade to facilitate being able to walk x 20 minutes with least restrictive or no AD to resume fishing, light yardwork/housework independently Baseline: 3+/5 Goal status: INITIAL   PLAN:  PT FREQUENCY: 2x/week  PT DURATION: 8 weeks  PLANNED INTERVENTIONS: 97110-Therapeutic exercises, 97530- Therapeutic activity, 97112- Neuromuscular re-education, 97535- Self Care, and 16109- Manual therapy  PLAN FOR NEXT SESSION: manual techniques and strengthening, progress ROM;    Sherill Ding Erla Bacchi PT, DPT, GCS  10:53 AM,10/01/23

## 2023-09-30 ENCOUNTER — Ambulatory Visit

## 2023-09-30 DIAGNOSIS — M25562 Pain in left knee: Secondary | ICD-10-CM | POA: Diagnosis not present

## 2023-09-30 DIAGNOSIS — M25662 Stiffness of left knee, not elsewhere classified: Secondary | ICD-10-CM

## 2023-09-30 DIAGNOSIS — M6281 Muscle weakness (generalized): Secondary | ICD-10-CM

## 2023-10-03 NOTE — Therapy (Signed)
 OUTPATIENT PHYSICAL THERAPY LOWER EXTREMITY TREATMENT   Patient Name: Eric English. MRN: [id number] DOB:12/28/1955, 68 y.o., male Today's Date: 10/05/2023  END OF SESSION:  PT End of Session - 10/05/23 0854     Visit Number 8    Number of Visits 17    Date for PT Re-Evaluation 11/02/23    Authorization Type 2x/week x 8 weeks (re-cert needed 6/96/29), [id number] 3/24-5/30 for 19 PT visits  [employer] #[id number] approved for 6 visits between 09/17/2023-11/15/2023  BCBS 2025  VL:30 pt only  Deductible: $1000/$307.06 used  Co ins:20%    Authorization - Visit Number 8    Authorization - Number of Visits 19    PT Start Time 0845    PT Stop Time 0930    PT Time Calculation (min) 45 min    Activity Tolerance Patient tolerated treatment well    Behavior During Therapy WFL for tasks assessed/performed            Past Medical History:  Diagnosis Date   Abnormal glucose    Allergy    pollen   Arthritis    hands / knees   Hemorrhoids    Hypertension    Prostate enlargement    Sleep apnea    Trigger finger    Past Surgical History:  Procedure Laterality Date   COLONOSCOPY  06/16/2004   COLONOSCOPY WITH PROPOFOL  N/A 08/15/2015   Procedure: COLONOSCOPY WITH PROPOFOL ;  Surgeon: Marshall Skeeter, MD;  Location: [facility] ENDOSCOPY;  Service: Endoscopy;  Laterality: N/A;   HERNIA REPAIR  06/17/2007   umbilical   VASECTOMY  06/16/1980   Patient Active Problem List   Diagnosis Date Noted   Dyslipidemia 09/05/2022   Chronic pain of left knee 09/05/2022   Perennial allergic rhinitis with seasonal variation 03/03/2022   Internal derangement of knee 09/21/2020   Morbid obesity (HCC) 02/27/2020   History of elevated PSA 08/12/2019   OSA on CPAP 08/12/2019   Hyperlipidemia associated with type 2 diabetes mellitus (HCC) 03/18/2019   Chronic left shoulder pain 01/04/2018   Chronic seasonal allergic rhinitis due to pollen 09/19/2016   Hemorrhoids 07/15/2016   Erectile  dysfunction of organic origin 10/08/2015   Elevated liver enzymes 10/08/2015   Elevated PSA 07/11/2015   Superficial thrombophlebitis 06/23/2015   Diastasis recti 12/27/2013   Benign essential HTN 03/15/2010    PCP: Dr. Ava Lei, MD  REFERRING PROVIDER: Dr. Swaz[place], MD  REFERRING DIAG: s/p hemiarthroplasty L knee (medial compartment)  THERAPY DIAG:  Stiffness of left knee, not elsewhere classified  Muscle weakness (generalized)  Rationale for Evaluation and Treatment: Rehabilitation  ONSET DATE: 09/04/23  SUBJECTIVE: (from initial evaluation note 09/07/23)  SUBJECTIVE STATEMENT: Pt underwent left medial unicompartmental knee arthroplasty on 09/04/23.  He had surgery and then d/c home same day.  Had surgery and then came home from [facility].    C/o tightness/pain in knee, difficulty walking  Has a HEP and worked on it over the weekend   Pain control: Tramadol, pain medication and ice pack, also using a heating pad some pad behind knee knee too    Pt using FWW currently  Pt's wife is present today; has good support- lives part time locally and part time in [place] (for a long time) due to his work  PERTINENT HISTORY: Decided to have surgery b/c of OA; he is very active, was in the [employer] for many years prior to this.   PAIN: Are you having pain? Best 5/10 and worst 10/10  PRECAUTIONS: None  RED FLAGS: None   WEIGHT BEARING RESTRICTIONS: No  FALLS: Has patient fallen in last 6 months? No  LIVING ENVIRONMENT: Lives with: lives with their spouse Lives in: House/apartment Stairs: 3 steps to enter,  14 stairs inside to second floor for bedroom; in [place] he has 2 steps in and then single level once inside Has following equipment at home: Otho Blitz - 2 wheeled  OCCUPATION: desk job; Arts development officer in [place]   PLOF: Independent  PATIENT GOALS: to return to riding a bike, walk, fishing, get back to work- hoping by 10/19/23  NEXT MD VISIT: 09/21/23;  10/12/23  OBJECTIVE:  Note: Objective measures were completed at Evaluation unless otherwise noted.  DIAGNOSTIC FINDINGS: post op  PATIENT SURVEYS: LEFS 20  COGNITION:Overall cognitive status: Within functional limits for tasks assessed     SENSATION:WFL  EDEMA: Typical postop swelling of L LE noted   POSTURE: Pt's FWW appears slightly tall, shoulders shrugged when amb  PALPATION: TTP throughout L knee  LOWER EXTREMITY ROM:  Active ROM Right eval Left eval  Hip flexion    Hip extension    Hip abduction    Hip adduction    Hip internal rotation    Hip external rotation    Knee flexion  80-85  Knee extension  Lacking 5-10  Ankle dorsiflexion    Ankle plantarflexion    Ankle inversion    Ankle eversion     (Blank rows = not tested)  LOWER EXTREMITY MMT:  MMT Right eval Left eval  Hip flexion    Hip extension    Hip abduction    Hip adduction    Hip internal rotation    Hip external rotation    Knee flexion  3+  Knee extension  3+  Ankle dorsiflexion    Ankle plantarflexion    Ankle inversion    Ankle eversion     (Blank rows = not tested)   FUNCTIONAL TESTS:  Pt able to transfer sit to stand and sit to supine independently; uses UE support for sit to stand  GAIT: Distance walked: in/out of clinic Assistive device utilized: Walker - 2 wheeled Level of assistance: Complete Independence Comments: pt with decreased WB on L LE, decreased knee flex during swing phase on L                                                                                                                                TREATMENT DATE: 10/05/23    Subjective: Pt reports that he is doing well today. No changes reported since the last therapy session. No specific concerns today.   Pain: No   Manual Therapy  Supine L knee extension stretch with heel on bolster and overpressure from therapist to pt tolerance 10s hold x 3; Supine L heel slide progressing through greater  degrees of flexion, at each end range therapist performs tibia on femur AP mobilizations, grade II, 20s/bout  x 3 bouts total; Supine L patellar mobilizations, grade II-III, 20s/bout x 1 bout/each direction; Supine L tibia on femur rotation mobilizations at end range extension, grade II-III, 20s/bout x 2 bouts;   Ther-ex  SciFit L4-6 BLE only with therapist gradually progressing seat forward (starting position 11, ending at position 8) to encourage L knee flexion x 10 minutes during interval history; Standing L knee flexion stretch on second step of staircase x 60s; Total Gym Level 22 double leg squats 2 x 15; TG L22 R single leg squats 2 x 10; Standing L single leg heel raises 2 x 10; Standing hip abduction with blue tband 2 x 10 BLE; Standing hip extension with blue tband 2 x 10 BLE; Standing L TKE with blue tband 2 x 10; Supine L SLR with 5#  Hooklying L single leg bridges x 10;   Not performed: Hooklying clams with manual resistance from therapist  2 x 10; Hooklying adductor squeeze with manual resistance from therapist 2 x 10; Supine L heel slide with manually resisted extension x 10; 6" R lateral step downs to 2" Airex pad x 15;   PATIENT EDUCATION:  Education details: Pt educated throughout session about proper posture and technique with exercises. Improved exercise technique, movement at target joints, use of target muscles after min to mod verbal, visual, tactile cues.  Person educated: Patient Education method: Explanation, Demonstration, Tactile cues, Verbal cues, and Handouts Education comprehension: verbalized understanding and returned demonstration  HOME EXERCISE PROGRAM: Access Code: [email or web address] URL: [email or web address] Date: 09/17/2023 Prepared by: Crawford Dock  Exercises - Quad Setting and Stretching  - 2-3 x daily - 7 x weekly - 3 sets - 10 reps - 5s hold - Supine Active Straight Leg Raise (Mirrored)  - 2-3 x daily - 7 x weekly - 3 sets - 10  reps - Seated Long Arc Quad (Mirrored)  - 2-3 x daily - 7 x weekly - 3 sets - 10 reps - Seated Knee Flexion Stretch (Mirrored)  - 2-3 x daily - 7 x weekly - start 2-3 minutes and progress until you can hold stretch for approximately 20 minutes hold - Seated Passive Knee Extension  - 2-3 x daily - 7 x weekly - start 2-3 minutes and progress until you can hold stretch for approximately 20 minutes hold - Mini Squat with Counter Support  - 2-3 x daily - 7 x weekly - 3 sets - 10 reps - Heel Raises with Counter Support  - 2-3 x daily - 7 x weekly - 3 sets - 10 reps - 3s hold   ASSESSMENT:  CLINICAL IMPRESSION: Progressed manual therapy and strengthening during session. His range of motion continues to improve. Performed additional standing tband resisted hip exercises. Updated HEP and educated pt regarding modifications. Pt encouraged to follow-up as scheduled. He will benefit from PT services to address deficits in strength, range of motion, pain, balance, and mobility in order to return to full function at home.   OBJECTIVE IMPAIRMENTS: Abnormal gait, decreased activity tolerance, decreased endurance, decreased mobility, difficulty walking, decreased ROM, decreased strength, impaired perceived functional ability, and pain.   ACTIVITY LIMITATIONS: carrying, lifting, bending, sitting, standing, squatting, sleeping, stairs, transfers, and locomotion level  PARTICIPATION LIMITATIONS: meal prep, cleaning, interpersonal relationship, driving, community activity, occupation, and yard work  PERSONAL FACTORS: Age and Time since onset of injury/illness/exacerbation are also affecting patient's functional outcome.   REHAB POTENTIAL: Excellent  CLINICAL DECISION MAKING: Stable/uncomplicated  EVALUATION COMPLEXITY: Low   GOALS: Goals reviewed with  patient? Yes  SHORT TERM GOALS: Target date: 09/29/23 Pt will amb with achieving L heel strike and normal swing through gait mechanics on L LE with least  restrictive AD x 10 min on flat surfaces Baseline: heel strike impaired, knee flex impaired Goal status: INITIAL   LONG TERM GOALS: Target date: 11/02/23  Improve LEFS >18 (MDC 9) indicating pt able to perform his daily and work activities without being as limited by L knee Baseline: 20 Goal status: INITIAL  2.  Pt's L knee AROM will improve 0-120 to facilitate being able to amb, ascend/descend stairs, ride in car, get up/down from sitting with normal LE mechanics and not being limited by L knee tightness Baseline: flexion 85; 09/30/23: -3 to 113; Goal status: INITIAL  3.  Improve L LE strength 1 MMT grade to facilitate being able to walk x 20 minutes with least restrictive or no AD to resume fishing, light yardwork/housework independently Baseline: 3+/5 Goal status: INITIAL   PLAN:  PT FREQUENCY: 2x/week  PT DURATION: 8 weeks  PLANNED INTERVENTIONS: 97110-Therapeutic exercises, 97530- Therapeutic activity, 97112- Neuromuscular re-education, 97535- Self Care, and 57846- Manual therapy  PLAN FOR NEXT SESSION: manual techniques and strengthening, progress ROM;    Sherill Ding Dabid Godown PT, DPT, GCS  1:29 PM,10/05/23

## 2023-10-05 ENCOUNTER — Ambulatory Visit

## 2023-10-05 DIAGNOSIS — M6281 Muscle weakness (generalized): Secondary | ICD-10-CM

## 2023-10-05 DIAGNOSIS — M25662 Stiffness of left knee, not elsewhere classified: Secondary | ICD-10-CM

## 2023-10-05 DIAGNOSIS — M25562 Pain in left knee: Secondary | ICD-10-CM | POA: Diagnosis not present

## 2023-10-06 NOTE — Therapy (Signed)
 OUTPATIENT PHYSICAL THERAPY LOWER EXTREMITY TREATMENT   Patient Name: Eric English. MRN: [id number] DOB:Aug 21, 1955, 68 y.o., male Today's Date: 10/07/2023  END OF SESSION:  PT End of Session - 10/07/23 0934     Visit Number 9    Number of Visits 17    Date for PT Re-Evaluation 11/02/23    Authorization Type 2x/week x 8 weeks (re-cert needed 09/23/79), [employer]#[id number] 3/24-5/30 for 19 PT visits  [employer] #[id number] approved for 6 visits between 09/17/2023-11/15/2023  [employer] 2025  VL:30 pt only  Deductible: $1000/$307.06 used  Co ins:20%    Authorization - Visit Number 9    Authorization - Number of Visits 19    PT Start Time 0850    PT Stop Time 0935    PT Time Calculation (min) 45 min    Activity Tolerance Patient tolerated treatment well    Behavior During Therapy [facility] for tasks assessed/performed             Past Medical History:  Diagnosis Date   Abnormal glucose    Allergy    pollen   Arthritis    hands / knees   Hemorrhoids    Hypertension    Prostate enlargement    Sleep apnea    Trigger finger    Past Surgical History:  Procedure Laterality Date   COLONOSCOPY  06/16/2004   COLONOSCOPY WITH PROPOFOL  N/A 08/15/2015   Procedure: COLONOSCOPY WITH PROPOFOL ;  Surgeon: Marshall Skeeter, MD;  Location: [facility] ENDOSCOPY;  Service: Endoscopy;  Laterality: N/A;   HERNIA REPAIR  06/17/2007   umbilical   VASECTOMY  06/16/1980   Patient Active Problem List   Diagnosis Date Noted   Dyslipidemia 09/05/2022   Chronic pain of left knee 09/05/2022   Perennial allergic rhinitis with seasonal variation 03/03/2022   Internal derangement of knee 09/21/2020   Morbid obesity (HCC) 02/27/2020   History of elevated PSA 08/12/2019   OSA on CPAP 08/12/2019   Hyperlipidemia associated with type 2 diabetes mellitus (HCC) 03/18/2019   Chronic left shoulder pain 01/04/2018   Chronic seasonal allergic rhinitis due to pollen 09/19/2016   Hemorrhoids 07/15/2016   Erectile  dysfunction of organic origin 10/08/2015   Elevated liver enzymes 10/08/2015   Elevated PSA 07/11/2015   Superficial thrombophlebitis 06/23/2015   Diastasis recti 12/27/2013   Benign essential HTN 03/15/2010    PCP: Dr. Ava Lei, MD  REFERRING PROVIDER: Dr. Swaziland, MD  REFERRING DIAG: s/p hemiarthroplasty L knee (medial compartment)  THERAPY DIAG:  Stiffness of left knee, not elsewhere classified  Muscle weakness (generalized)  Rationale for Evaluation and Treatment: Rehabilitation  ONSET DATE: 09/04/23  SUBJECTIVE: (from initial evaluation note 09/07/23)  SUBJECTIVE STATEMENT: Pt underwent left medial unicompartmental knee arthroplasty on 09/04/23.  He had surgery and then d/c home same day.  Had surgery and then came home from [facility].    C/o tightness/pain in knee, difficulty walking  Has a HEP and worked on it over the weekend   Pain control: Tramadol, pain medication and ice pack, also using a heating pad some pad behind knee knee too    Pt using FWW currently  Pt's wife is present today; has good support- lives part time locally and part time in [place] (for a long time) due to his work  PERTINENT HISTORY: Decided to have surgery b/c of OA; he is very active, was in the [employer] for many years prior to this.   PAIN: Are you having pain? Best 5/10 and worst  10/10   PRECAUTIONS: None  RED FLAGS: None   WEIGHT BEARING RESTRICTIONS: No  FALLS: Has patient fallen in last 6 months? 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  DIAGNOSTIC FINDINGS: post op  PATIENT SURVEYS: LEFS 20  COGNITION:Overall cognitive status: Within functional limits for tasks assessed     SENSATION:WFL  EDEMA: Typical postop swelling of L LE noted   POSTURE: Pt's FWW appears slightly tall, shoulders shrugged when amb  PALPATION: TTP throughout L knee  LOWER EXTREMITY ROM:  Active ROM Right eval Left eval  Hip flexion    Hip extension    Hip abduction    Hip adduction    Hip internal rotation    Hip external rotation    Knee flexion  80-85  Knee extension  Lacking 5-10  Ankle dorsiflexion    Ankle plantarflexion    Ankle inversion    Ankle eversion     (Blank rows = not tested)  LOWER EXTREMITY MMT:  MMT Right eval Left eval  Hip flexion    Hip extension    Hip abduction    Hip adduction    Hip internal rotation    Hip external rotation    Knee flexion  3+  Knee extension  3+  Ankle dorsiflexion    Ankle plantarflexion    Ankle inversion    Ankle eversion     (Blank rows = not tested)   FUNCTIONAL TESTS:  Pt able to transfer sit to stand and sit to supine independently; uses UE support for sit to stand  GAIT: Distance walked: in/out of clinic Assistive device utilized: Walker - 2 wheeled Level of assistance: Complete Independence Comments: pt with decreased WB on L LE, decreased knee flex during swing phase on L                                                                                                                                TREATMENT DATE: 10/05/23    Subjective: Pt reports that he is doing well today. He had some L knee soreness last night after a busy day which caused difficulty sleeping soundly. He has an appointment to see his surgeon on Monday and his return to work date is set for 10/19/23. No specific concerns today.   Pain: No notable resting pain;   Manual Therapy   Supine L heel slide progressing through greater degrees of  flexion, at each end range therapist performs tibia on femur AP mobilizations, grade II, 20s/bout x 3 bouts total; L knee AAROM: -3 to 120 degrees LEFS: 59/80   Ther-ex  SciFit L4-6 BLE only with therapist gradually progressing seat forward (starting position 11, ending at position 8) to encourage L knee flexion x 10 minutes during interval history; Standing L knee flexion stretch on second step of staircase x 60s; 6" step heel raises progressing to L unilateral x 15; Walking lunges in // bars x multiple lengths; Resisted side stepping with blue tband around ankles x multiple lengths; L single leg Airex balance with ball tosses; TRX squats 2 x 10;   Not performed: Hooklying clams with manual resistance from therapist  2 x 10; Hooklying adductor squeeze with manual resistance from therapist 2 x 10; Supine L heel slide with manually resisted extension x 10; 6" R lateral step downs to 2" Airex pad x 15; Supine L patellar mobilizations, grade II-III, 20s/bout x 1 bout/each direction; Supine L tibia on femur rotation mobilizations at end range extension, grade II-III, 20s/bout x 2 bouts; Total Gym Level 22 double leg squats 2 x 15; TG L22 R single leg squats 2 x 10; Standing L single leg heel raises 2 x 10; Standing hip abduction with blue tband 2 x 10 BLE; Standing hip extension with blue tband 2 x 10 BLE; Standing L TKE with blue tband 2 x 10; Supine L SLR with 5#  Hooklying L single leg bridges x 10;   PATIENT EDUCATION:  Education details: Pt educated throughout session about proper posture and technique with exercises. Improved exercise technique, movement at target joints, use of target muscles after min to mod verbal, visual, tactile cues.  Person educated: Patient Education method: Explanation, Demonstration, Tactile cues, Verbal cues, and Handouts Education comprehension: verbalized understanding and returned  demonstration  HOME EXERCISE PROGRAM: Access Code: [email or web address] URL: [email or web address] Date: 09/17/2023 Prepared by: Crawford Dock  Exercises - Quad Setting and Stretching  - 2-3 x daily - 7 x weekly - 3 sets - 10 reps - 5s hold - Supine Active Straight Leg Raise (Mirrored)  - 2-3 x daily - 7 x weekly - 3 sets - 10 reps - Seated Long Arc Quad (Mirrored)  - 2-3 x daily - 7 x weekly - 3 sets - 10 reps - Seated Knee Flexion Stretch (Mirrored)  - 2-3 x daily - 7 x weekly - start 2-3 minutes and progress until you can hold stretch for approximately 20 minutes hold - Seated Passive Knee Extension  - 2-3 x daily - 7 x weekly - start 2-3 minutes and progress until you can hold stretch for approximately 20 minutes hold - Mini Squat with Counter Support  - 2-3 x daily - 7 x weekly - 3 sets - 10 reps - Heel Raises with Counter Support  - 2-3 x daily - 7 x weekly - 3 sets - 10 reps - 3s hold   ASSESSMENT:  CLINICAL IMPRESSION: Progressed manual therapy and strengthening during session. His L knee range of motion continues to improve and he measured -3 to 120 degrees today. He continues with some mild swelling throughout entire LLE. Encouraged progressing gym-based strengthening. He has a follow-up with his surgeon on Monday. Pt encouraged to follow-up as scheduled. He will benefit from PT services to address deficits in strength, range of motion, pain, balance, and mobility in order to return to full function at home.   OBJECTIVE IMPAIRMENTS: Abnormal gait, decreased activity  tolerance, decreased endurance, decreased mobility, difficulty walking, decreased ROM, decreased strength, impaired perceived functional ability, and pain.   ACTIVITY LIMITATIONS: carrying, lifting, bending, sitting, standing, squatting, sleeping, stairs, transfers, and locomotion level  PARTICIPATION LIMITATIONS: meal prep, cleaning, interpersonal relationship, driving, community activity, occupation, and yard  work  PERSONAL FACTORS: Age and Time since onset of injury/illness/exacerbation are also affecting patient's functional outcome.   REHAB POTENTIAL: Excellent  CLINICAL DECISION MAKING: Stable/uncomplicated  EVALUATION COMPLEXITY: Low   GOALS: Goals reviewed with patient? Yes  SHORT TERM GOALS: Target date: 09/29/23 Pt will amb with achieving L heel strike and normal swing through gait mechanics on L LE with least restrictive AD x 10 min on flat surfaces Baseline: heel strike impaired, knee flex impaired Goal status: ACHIEVED   LONG TERM GOALS: Target date: 11/02/23  Improve LEFS >18 (MDC 9) indicating pt able to perform his daily and work activities without being as limited by L knee Baseline: 20; 10/07/23: 59/80; Goal status: ACHIEVED  2.  Pt's L knee AROM will improve 0-120 to facilitate being able to amb, ascend/descend stairs, ride in car, get up/down from sitting with normal LE mechanics and not being limited by L knee tightness Baseline: flexion 85; 09/30/23: -3 to 113; 10/07/23: -3 to 120; Goal status: PARTIALLY MET  3.  Improve L LE strength 1 MMT grade to facilitate being able to walk x 20 minutes with least restrictive or no AD to resume fishing, light yardwork/housework independently Baseline: 3+/5 Goal status: INITIAL   PLAN:  PT FREQUENCY: 2x/week  PT DURATION: 8 weeks  PLANNED INTERVENTIONS: 97110-Therapeutic exercises, 97530- Therapeutic activity, 97112- Neuromuscular re-education, 97535- Self Care, and 56213- Manual therapy  PLAN FOR NEXT SESSION: manual techniques and strengthening, progress ROM;    Sherill Ding Mimi Debellis PT, DPT, GCS  10:24 AM,10/07/23

## 2023-10-07 ENCOUNTER — Ambulatory Visit

## 2023-10-07 DIAGNOSIS — M6281 Muscle weakness (generalized): Secondary | ICD-10-CM

## 2023-10-07 DIAGNOSIS — M25562 Pain in left knee: Secondary | ICD-10-CM | POA: Diagnosis not present

## 2023-10-07 DIAGNOSIS — M25662 Stiffness of left knee, not elsewhere classified: Secondary | ICD-10-CM

## 2023-10-08 ENCOUNTER — Ambulatory Visit: Payer: BLUE CROSS/BLUE SHIELD

## 2023-10-08 VITALS — Ht 71.0 in | Wt 265.0 lb

## 2023-10-08 DIAGNOSIS — Z0000 Encounter for general adult medical examination without abnormal findings: Secondary | ICD-10-CM | POA: Diagnosis not present

## 2023-10-08 NOTE — Patient Instructions (Addendum)
 Mr. Eric English , Thank you for taking time to come for your Medicare Wellness Visit. I appreciate your ongoing commitment to your health goals. Please review the following plan we discussed and let me know if I can assist you in the future.   Referrals/Orders/Follow-Ups/Clinician Recommendations: Yes, keep maintaining your health by keeping your appointments with Dr. Arleen Lacer and any specialists that you may see.  Call us  if you need anything.  Have a great year!!!!  This is a list of the screening recommended for you and due dates:  Health Maintenance  Topic Date Due   Eye exam for diabetics  Never done   Yearly kidney function blood test for diabetes  09/05/2023   Hemoglobin A1C  10/25/2023   Flu Shot  01/15/2024   Yearly kidney health urinalysis for diabetes  04/26/2024   Complete foot exam   04/26/2024   Medicare Annual Wellness Visit  10/07/2024   Colon Cancer Screening  08/14/2025   Pneumonia Vaccine  Completed   Hepatitis C Screening  Completed   Zoster (Shingles) Vaccine  Completed   HPV Vaccine  Aged Out   Meningitis B Vaccine  Aged Out   DTaP/Tdap/Td vaccine  Discontinued   COVID-19 Vaccine  Discontinued    Advanced directives: (Copy Requested) Please bring a copy of your health care power of attorney and living will to the office to be added to your chart at your convenience. You can mail to [facility] [address] [place], [place] [address] or email to [email or web address]  Next Medicare Annual Wellness Visit scheduled for next year: Yes, It was nice speaking with you today! Your next Annual Wellness Visit is scheduled for 10/10/2024 at 1:00 p.m. via PHONE VISIT. If you need to reschedule or cancel, please call [phone].

## 2023-10-08 NOTE — Progress Notes (Signed)
 Because this visit was a virtual/telehealth visit,  certain criteria was not obtained, such a blood pressure, CBG if applicable, and timed get up and go. Any medications not marked as "taking" were not mentioned during the medication reconciliation part of the visit. Any vitals not documented were not able to be obtained due to this being a telehealth visit or patient was unable to self-report a recent blood pressure reading due to a lack of equipment at home via telehealth. Vitals that have been documented are verbally provided by the patient.   Subjective:   Eric English. is a 68 y.o. who presents for a Medicare Wellness preventive visit.  Visit Complete: Virtual I connected with  Eric English. on 10/08/23 by a audio enabled telemedicine application and verified that I am speaking with the correct person using two identifiers.  Patient Location: Home  Provider Location: Office/Clinic  I discussed the limitations of evaluation and management by telemedicine. The patient expressed understanding and agreed to proceed.  Vital Signs: Because this visit was a virtual/telehealth visit, some criteria may be missing or patient reported. Any vitals not documented were not able to be obtained and vitals that have been documented are patient reported.  VideoDeclined- This patient declined Librarian, academic. Therefore the visit was completed with audio only.  Persons Participating in Visit: Patient.  AWV Questionnaire: No: Patient Medicare AWV questionnaire was not completed prior to this visit.  Cardiac Risk Factors include: advanced age (>65men, >46 women);family history of premature cardiovascular disease;hypertension;male gender;obesity (BMI >30kg/m2);dyslipidemia     Objective:    Today's Vitals   10/08/23 1334 10/08/23 1335  Weight: 265 lb (120.2 kg)   Height: 5\' 11"  (1.803 m)   PainSc: 2  2   PainLoc: Knee    Body mass index is 36.96  kg/m.     10/08/2023    1:37 PM 10/03/2022    1:36 PM 02/17/2021    3:26 PM 01/09/2017   10:58 AM 09/19/2016    8:49 AM 07/15/2016   10:07 AM 06/18/2016    2:27 PM  Advanced Directives  Does Patient Have a Medical Advance Directive? Yes Yes No No No No No  Type of Estate agent of [place];Living will Healthcare Power of Attorney       Copy of Healthcare Power of Attorney in Chart? No - copy requested          Current Medications (verified) Outpatient Encounter Medications as of 10/08/2023  Medication Sig   acetaminophen  (TYLENOL ) 500 MG tablet Take 1 tablet (500 mg total) by mouth every 6 (six) hours as needed.   atorvastatin  (LIPITOR) 40 MG tablet Take 1 tablet (40 mg total) by mouth daily.   B Complex Vitamins (VITAMIN B COMPLEX PO) Take 1 tablet by mouth daily.   lisinopril -hydrochlorothiazide  (ZESTORETIC ) 20-25 MG tablet Take 1 tablet by mouth daily.   loratadine  (CLARITIN ) 10 MG tablet Take 1 tablet (10 mg total) by mouth daily.   metFORMIN  (GLUCOPHAGE -XR) 500 MG 24 hr tablet Take 1 tablet (500 mg total) by mouth daily with breakfast.   Omega-3 Fatty Acids (FISH OIL) 1200 MG CAPS Take 1 capsule by mouth 2 (two) times daily.   No facility-administered encounter medications on file as of 10/08/2023.    Allergies (verified) Patient has no known allergies.   History: Past Medical History:  Diagnosis Date   Abnormal glucose    Allergy    pollen   Arthritis  hands / knees   Hemorrhoids    Hypertension    Prostate enlargement    Sleep apnea    Trigger finger    Past Surgical History:  Procedure Laterality Date   COLONOSCOPY  06/16/2004   COLONOSCOPY WITH PROPOFOL  N/A 08/15/2015   Procedure: COLONOSCOPY WITH PROPOFOL ;  Surgeon: Marshall Skeeter, MD;  Location: [facility] ENDOSCOPY;  Service: Endoscopy;  Laterality: N/A;   HERNIA REPAIR  06/17/2007   umbilical   VASECTOMY  06/16/1980   Family History  Problem Relation Age of Onset   Diabetes Mother     Hypertension Mother    CAD Mother    Hyperlipidemia Mother    English disease Father    CAD Father    Diabetes Father    Alcohol abuse Father    Hypertension Sister    Scleroderma Sister    Alcohol abuse Brother    Social History   Socioeconomic History   Marital status: Married    Spouse name: Eric English    Number of children: 3   Years of education: Not on file   Highest education level: Bachelor's degree (e.g., BA, AB, BS)  Occupational History    Employer: [employer]  Tobacco Use   Smoking status: Former    Current packs/day: 0.00    Average packs/day: 1 pack/day for 24.0 years (24.0 ttl pk-yrs)    Types: Cigarettes    Start date: 09/14/1973    Quit date: 09/14/1997    Years since quitting: 26.0   Smokeless tobacco: Never  Substance and Sexual Activity   Alcohol use: Yes    Alcohol/week: 2.0 - 3.0 standard drinks of alcohol    Types: 1 Glasses of wine, 1 - 2 Cans of beer per week    Comment: Occasionally   Drug use: No   Sexual activity: Yes    Birth control/protection: None  Other Topics Concern   Not on file  Social History Narrative   Married, wife and children live in [place], he works in [place] , 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 [place]   Social Drivers of Health   Financial Resource Strain: Low Risk  (10/08/2023)   Overall Financial Resource Strain (CARDIA)    Difficulty of Paying Living Expenses: Not hard at all  Food Insecurity: No Food Insecurity (10/08/2023)   Hunger Vital Sign    Worried About Running Out of Food in the Last Year: Never true    Ran Out of Food in the Last Year: Never true  Transportation Needs: No Transportation Needs (10/08/2023)   PRAPARE - Administrator, Civil Service (Medical): No    Lack of Transportation (Non-Medical): No  Physical Activity: Sufficiently Active (10/08/2023)   Exercise Vital Sign    Days of Exercise per Week: 7 days    Minutes of Exercise per Session: 60 min  Stress: No Stress Concern  Present (10/08/2023)   [employer] of Occupational Health - Occupational Stress Questionnaire    Feeling of Stress : Not at all  Social Connections: Socially Integrated (10/08/2023)   Social Connection and Isolation Panel [NHANES]    Frequency of Communication with Friends and Family: More than three times a week    Frequency of Social Gatherings with Friends and Family: Once a week    Attends Religious Services: More than 4 times per year    Active Member of [employer] or Organizations: Yes    Attends Banker Meetings: Never    Marital Status:  Married    Tobacco Counseling Counseling given: Not Answered    Clinical Intake:  Pre-visit preparation completed: Yes  Pain : 0-10 Pain Score: 2  Pain Type: Other (Comment) (S/P KNEE SURGERY) Pain Location: Knee Pain Orientation: Left Pain Descriptors / Indicators: Discomfort Pain Relieving Factors: PAIN MEDICATION  Pain Relieving Factors: PAIN MEDICATION  BMI - recorded: 39.96 Nutritional Status: BMI > 30  Obese Nutritional Risks: None Diabetes: No (PREDIABETIC)  Lab Results  Component Value Date   HGBA1C 6.4 (A) 04/27/2023   HGBA1C 6.6 (H) 09/05/2022   HGBA1C 6.4 (H) 03/03/2022     How often do you need to have someone help you when you read instructions, pamphlets, or other written materials from your doctor or pharmacy?: 1 - Never What is the last grade level you completed in school?: [employer]REE  Interpreter Needed?: No  Information entered by :: Druscilla Gerhard, LPN.   Activities of Daily Living     10/08/2023    1:41 PM 04/27/2023    2:59 PM  In your present state of health, do you have any difficulty performing the following activities:  Hearing? 1 0  Vision? 0 1  Difficulty concentrating or making decisions? 0 0  Walking or climbing stairs? 0 1  Dressing or bathing? 0 0  Doing errands, shopping? 0 0  Preparing Food and eating ? N   Using the Toilet? N   In the past six months, have  you accidently leaked urine? N   Do you have problems with loss of bowel control? N   Managing your Medications? N   Managing your Finances? N   Housekeeping or managing your Housekeeping? N     Patient Care Team: Sowles, Krichna, MD as PCP - General (Family Medicine) Constancia Delton, MD as PCP - Cardiology (Cardiology) Cinderella Crea, MD (Family Medicine) Marquita Situ, Magali Schmitz, MD (General Surgery) [facility] as Consulting Physician (Optometry)  Indicate any recent Medical Services you may have received from other than [facility] providers in the past year (date may be approximate).     Assessment:   This is a routine wellness examination for Eric English.  Hearing/Vision screen Hearing Screening - Comments:: Patient has adequate hearing. No hearing aids. Vision Screening - Comments:: Patient wears rx glasses - up to date with routine eye exams with Dr. Wayna Hails with [facility] [place], [place])    Goals Addressed             This Visit's Progress    Patient Stated       10/08/2023: My goal is to get my left knee back into shape after having surgery, get more fit and increase physical activity.       Depression Screen     10/08/2023    1:39 PM 04/27/2023    2:59 PM 10/03/2022    1:40 PM 09/05/2022    9:42 AM 03/03/2022    8:47 AM 07/01/2021    8:36 AM 09/21/2020   10:06 AM  PHQ 2/9 Scores  PHQ - 2 Score 0 0 0 0 1 2 0  PHQ- 9 Score 1 0 0 0 1 2     Fall Risk     10/08/2023    1:38 PM 04/27/2023    2:59 PM 10/03/2022    1:33 PM 09/05/2022    9:42 AM 03/03/2022    8:46 AM  Fall Risk   Falls in the past year? 0 0 0 0 0  Number falls in  past yr: 0 0 0  0  Injury with Fall? 0 0 0  0  Risk for fall due to : No Fall Risks  No Fall Risks No Fall Risks No Fall Risks  Follow up Falls prevention discussed;Falls evaluation completed  Education provided;Falls prevention discussed Falls prevention discussed;Education provided;Falls evaluation  completed Falls prevention discussed    MEDICARE RISK AT HOME:  Medicare Risk at Home Any stairs in or around the home?: Yes If so, are there any without handrails?: No Home free of loose throw rugs in walkways, pet beds, electrical cords, etc?: Yes Adequate lighting in your home to reduce risk of falls?: Yes Life alert?: No Use of a cane, walker or w/c?: Yes Grab bars in the bathroom?: Yes (shower) Shower chair or bench in shower?: No Elevated toilet seat or a handicapped toilet?: Yes  TIMED UP AND GO:  Was the test performed?  No  Cognitive Function: 6CIT completed    10/08/2023    1:41 PM  MMSE - Mini Mental State Exam  Not completed: Unable to complete        10/08/2023    1:39 PM 10/03/2022    1:44 PM  6CIT Screen  What Year? 0 points 0 points  What month? 0 points 0 points  What time? 0 points 0 points  Count back from 20 0 points 0 points  Months in reverse 0 points 0 points  Repeat phrase 0 points 0 points  Total Score 0 points 0 points    Immunizations Immunization History  Administered Date(s) Administered   Fluad Quad(high Dose 65+) 02/27/2020, 03/03/2022   Fluad Trivalent(High Dose 65+) 04/27/2023   Influenza,inj,Quad PF,6+ Mos 03/21/2014, 03/13/2015, 06/18/2016, 05/14/2018, 03/17/2019   Pneumococcal Conjugate-13 09/21/2020   Pneumococcal Polysaccharide-23 08/12/2019   Tdap 05/31/2012   Zoster Recombinant(Shingrix) 09/21/2020, 12/03/2020    Screening Tests Health Maintenance  Topic Date Due   OPHTHALMOLOGY EXAM  Never done   Diabetic kidney evaluation - eGFR measurement  09/05/2023   HEMOGLOBIN A1C  10/25/2023   INFLUENZA VACCINE  01/15/2024   Diabetic kidney evaluation - Urine ACR  04/26/2024   FOOT EXAM  04/26/2024   Medicare Annual Wellness (AWV)  10/07/2024   Colonoscopy  08/14/2025   Pneumonia Vaccine 51+ Years old  Completed   Hepatitis C Screening  Completed   Zoster Vaccines- Shingrix  Completed   HPV VACCINES  Aged Out    Meningococcal B Vaccine  Aged Out   DTaP/Tdap/Td  Discontinued   COVID-19 Vaccine  Discontinued    Health Maintenance  Health Maintenance Due  Topic Date Due   OPHTHALMOLOGY EXAM  Never done   Diabetic kidney evaluation - eGFR measurement  09/05/2023   Health Maintenance Items Addressed: Yes, patient is due a diabetic kidney evaluation and already scheduled for diabetic eye exam.  Additional Screening:  Vision Screening: Recommended annual ophthalmology exams for early detection of glaucoma and other disorders of the eye.  Dental Screening: Recommended annual dental exams for proper oral hygiene  Community Resource Referral / Chronic Care Management: CRR required this visit?  No   CCM required this visit?  No     Plan:     I have personally reviewed and noted the following in the patient's chart:   Medical and social history Use of alcohol, tobacco or illicit drugs  Current medications and supplements including opioid prescriptions. Patient is currently taking opioid prescriptions. Information provided to patient regarding non-opioid alternatives. Patient advised to discuss non-opioid treatment plan with  their provider. Functional ability and status Nutritional status Physical activity Advanced directives List of other physicians Hospitalizations, surgeries, and ER visits in previous 12 months Vitals Screenings to include cognitive, depression, and falls Referrals and appointments  In addition, I have reviewed and discussed with patient certain preventive protocols, quality metrics, and best practice recommendations. A written personalized care plan for preventive services as well as general preventive health recommendations were provided to patient.     Margette Sheldon, LPN   [id number]   After Visit Summary: (MyChart) Due to this being a telephonic visit, the after visit summary with patients personalized plan was offered to patient via MyChart   Notes:  Please refer to Routing Comments.

## 2023-10-12 ENCOUNTER — Encounter

## 2023-10-13 NOTE — Therapy (Signed)
 OUTPATIENT PHYSICAL THERAPY LOWER EXTREMITY TREATMENT/PROGRESS NOTE  Dates of reporting period  09/07/23   to   10/14/23    Patient Name: Eric English. MRN: [id number] DOB:01-28-1956, 68 y.o., male Today's Date: 10/14/2023  END OF SESSION:  PT End of Session - 10/14/23 0900     Visit Number 10    Number of Visits 17    Date for PT Re-Evaluation 11/02/23    Authorization Type 2x/week x 8 weeks (re-cert needed 02/12/55), [employer] OZHY#[id number] 3/24-5/30 for 19 PT visits  [employer] Sie[employer]ried [employer] #[id number] approved for 6 visits between 09/17/2023-11/15/2023  BCBS 2025  VL:30 pt only  Deductible: $1000/$307.06 used  Co ins:20%    Authorization - Visit Number 10    Authorization - Number of Visits 19    PT Start Time 0848    PT Stop Time 0933    PT Time Calculation (min) 45 min    Activity Tolerance Patient tolerated treatment well    Behavior During Therapy [facility] for tasks assessed/performed            Past Medical History:  Diagnosis Date   Abnormal glucose    Allergy    pollen   Arthritis    hands / knees   Hemorrhoids    Hypertension    Prostate enlargement    Sleep apnea    Trigger finger    Past Surgical History:  Procedure Laterality Date   COLONOSCOPY  06/16/2004   COLONOSCOPY WITH PROPOFOL  N/A 08/15/2015   Procedure: COLONOSCOPY WITH PROPOFOL ;  Surgeon: Marshall Skeeter, MD;  Location: [facility] ENDOSCOPY;  Service: Endoscopy;  Laterality: N/A;   HERNIA REPAIR  06/17/2007   umbilical   VASECTOMY  06/16/1980   Patient Active Problem List   Diagnosis Date Noted   Dyslipidemia 09/05/2022   Chronic pain of left knee 09/05/2022   Perennial allergic rhinitis with seasonal variation 03/03/2022   Internal derangement of knee 09/21/2020   Morbid obesity (HCC) 02/27/2020   History of elevated PSA 08/12/2019   OSA on CPAP 08/12/2019   Hyperlipidemia associated with type 2 diabetes mellitus (HCC) 03/18/2019   Chronic left shoulder pain 01/04/2018   Chronic seasonal allergic rhinitis  due to pollen 09/19/2016   Hemorrhoids 07/15/2016   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   Elevated liver enzymes 10/08/2015   Elevated PSA 07/11/2015   Superficial thrombophlebitis 06/23/2015   Diastasis recti 12/27/2013   Benign essential HTN 03/15/2010    PCP: Dr. Ava Lei, MD  REFERRING PROVIDER: Dr. Swaziland, MD  REFERRING DIAG: s/p hemiarthroplasty L knee (medial compartment)  THERAPY DIAG:  Stiffness of left knee, not elsewhere classified  Muscle weakness (generalized)  Rationale for Evaluation and Treatment: Rehabilitation  ONSET DATE: 09/04/23  SUBJECTIVE: (from initial evaluation note 09/07/23)  SUBJECTIVE STATEMENT: Pt underwent left medial unicompartmental knee arthroplasty on 09/04/23.  He had surgery and then d/c home same day.  Had surgery and then came home from [facility].    C/o tightness/pain in knee, difficulty walking  Has a HEP and worked on it over the weekend   Pain control: Tramadol, pain medication and ice pack, also using a heating pad some pad behind knee knee too    Pt using FWW currently  Pt's wife is present today; has good support- lives part time locally and part time in [place] (for a long time) due to his work  PERTINENT HISTORY: Decided to have surgery b/c of OA; he is very active, was in the [employer] for many years  prior to this.   PAIN: Are you having pain? Best 5/10 and worst 10/10   PRECAUTIONS: None  RED FLAGS: None   WEIGHT BEARING RESTRICTIONS: No  FALLS: Has patient fallen in last 6 months? No  LIVING ENVIRONMENT: Lives with: lives with their spouse Lives in: House/apartment Stairs: 3 steps to enter,  14 stairs inside to second floor for bedroom; in [place] he has 2 steps in and then single level once inside Has following equipment at home: Otho Blitz - 2 wheeled  OCCUPATION: desk job; Arts development officer in [place]   PLOF: Independent  PATIENT GOALS: to return to riding a bike, walk, fishing, get back to  work- hoping by 10/19/23  NEXT MD VISIT: 09/21/23; 10/12/23  OBJECTIVE:  Note: Objective measures were completed at Evaluation unless otherwise noted.  DIAGNOSTIC FINDINGS: post op  PATIENT SURVEYS: LEFS 20  COGNITION:Overall cognitive status: Within functional limits for tasks assessed     SENSATION:WFL  EDEMA: Typical postop swelling of L LE noted   POSTURE: Pt's FWW appears slightly tall, shoulders shrugged when amb  PALPATION: TTP throughout L knee  LOWER EXTREMITY ROM:  Active ROM Right eval Left eval  Hip flexion    Hip extension    Hip abduction    Hip adduction    Hip internal rotation    Hip external rotation    Knee flexion  80-85  Knee extension  Lacking 5-10  Ankle dorsiflexion    Ankle plantarflexion    Ankle inversion    Ankle eversion     (Blank rows = not tested)  LOWER EXTREMITY MMT:  MMT Right eval Left eval  Hip flexion    Hip extension    Hip abduction    Hip adduction    Hip internal rotation    Hip external rotation    Knee flexion  3+  Knee extension  3+  Ankle dorsiflexion    Ankle plantarflexion    Ankle inversion    Ankle eversion     (Blank rows = not tested)   FUNCTIONAL TESTS:  Pt able to transfer sit to stand and sit to supine independently; uses UE support for sit to stand  GAIT: Distance walked: in/out of clinic Assistive device utilized: Walker - 2 wheeled Level of assistance: Complete Independence Comments: pt with decreased WB on L LE, decreased knee flex during swing phase on L                                                                                                                                TREATMENT DATE: 10/05/23    Subjective: Pt reports that he is doing well today. He had an appointment at his surgeon's office and the provider was pleased with his progress. He is going to be released to return to work for half-days soon and will ramp up to full days pending full clearance. No specific concerns  today.   Pain: No notable  resting pain;   Manual Therapy  Supine L heel slide progressing through greater degrees of flexion, at each end range therapist performs tibia on femur AP mobilizations, grade II, 20s/bout x 3 bouts total;   Ther-ex  SciFit L4-6 BLE only with therapist gradually progressing seat forward (starting position 11, ending at position 8) to encourage L knee flexion x 10 minutes during interval history; Standing L knee flexion stretch on second step of staircase x 60s; Seated L hamstring stretch on step x 60s; 6" step ups leading with LLE up and RLE down x 15; 12" step ups leading with LLE up and RLE down x 15; 6" lateral step down heel taps x 10; TRX staggered sit to stand with Airex pad on seat x 10;   Not performed: Hooklying clams with manual resistance from therapist  2 x 10; Hooklying adductor squeeze with manual resistance from therapist 2 x 10; Supine L heel slide with manually resisted extension x 10; 6" R lateral step downs to 2" Airex pad x 15; Supine L patellar mobilizations, grade II-III, 20s/bout x 1 bout/each direction; Supine L tibia on femur rotation mobilizations at end range extension, grade II-III, 20s/bout x 2 bouts; Total Gym Level 22 double leg squats 2 x 15; TG L22 R single leg squats 2 x 10; Standing L single leg heel raises 2 x 10; Standing hip abduction with blue tband 2 x 10 BLE; Standing hip extension with blue tband 2 x 10 BLE; Standing L TKE with blue tband 2 x 10; Supine L SLR with 5#  Hooklying L single leg bridges x 10; Walking lunges in // bars x multiple lengths; Resisted side stepping with blue tband around ankles x multiple lengths; L single leg Airex balance with ball tosses;   PATIENT EDUCATION:  Education details: Pt educated throughout session about proper posture and technique with exercises. Improved exercise technique, movement at target joints, use of target muscles after min to mod verbal, visual, tactile cues.   Person educated: Patient Education method: Explanation, Demonstration, Tactile cues, Verbal cues, and Handouts Education comprehension: verbalized understanding and returned demonstration  HOME EXERCISE PROGRAM: Access Code: [email or web address] URL: [email or web address] Date: 09/17/2023 Prepared by: Crawford Dock  Exercises - Quad Setting and Stretching  - 2-3 x daily - 7 x weekly - 3 sets - 10 reps - 5s hold - Supine Active Straight Leg Raise (Mirrored)  - 2-3 x daily - 7 x weekly - 3 sets - 10 reps - Seated Long Arc Quad (Mirrored)  - 2-3 x daily - 7 x weekly - 3 sets - 10 reps - Seated Knee Flexion Stretch (Mirrored)  - 2-3 x daily - 7 x weekly - start 2-3 minutes and progress until you can hold stretch for approximately 20 minutes hold - Seated Passive Knee Extension  - 2-3 x daily - 7 x weekly - start 2-3 minutes and progress until you can hold stretch for approximately 20 minutes hold - Mini Squat with Counter Support  - 2-3 x daily - 7 x weekly - 3 sets - 10 reps - Heel Raises with Counter Support  - 2-3 x daily - 7 x weekly - 3 sets - 10 reps - 3s hold   ASSESSMENT:  CLINICAL IMPRESSION: Progressed manual therapy and strengthening during session. His ROM has continued to improve and his LEFS increased significantly compared to initial evaluation. Plan to discharge next visit and will focus on progressive strengthening and extensive HEP review during next appointment. Pt encouraged to  follow-up as scheduled. He will benefit from PT services to address deficits in strength, range of motion, pain, balance, and mobility in order to return to full function at home.   OBJECTIVE IMPAIRMENTS: Abnormal gait, decreased activity tolerance, decreased endurance, decreased mobility, difficulty walking, decreased ROM, decreased strength, impaired perceived functional ability, and pain.   ACTIVITY LIMITATIONS: carrying, lifting, bending, sitting, standing, squatting, sleeping, stairs,  transfers, and locomotion level  PARTICIPATION LIMITATIONS: meal prep, cleaning, interpersonal relationship, driving, community activity, occupation, and yard work  PERSONAL FACTORS: Age and Time since onset of injury/illness/exacerbation are also affecting patient's functional outcome.   REHAB POTENTIAL: Excellent  CLINICAL DECISION MAKING: Stable/uncomplicated  EVALUATION COMPLEXITY: Low   GOALS: Goals reviewed with patient? Yes  SHORT TERM GOALS: Target date: 09/29/23 Pt will amb with achieving L heel strike and normal swing through gait mechanics on L LE with least restrictive AD x 10 min on flat surfaces Baseline: heel strike impaired, knee flex impaired Goal status: ACHIEVED   LONG TERM GOALS: Target date: 11/02/23  Improve LEFS >18 (MDC 9) indicating pt able to perform his daily and work activities without being as limited by L knee Baseline: 20; 10/07/23: 59/80; Goal status: ACHIEVED  2.  Pt's L knee AROM will improve 0-120 to facilitate being able to amb, ascend/descend stairs, ride in car, get up/down from sitting with normal LE mechanics and not being limited by L knee tightness Baseline: flexion 85; 09/30/23: -3 to 113; 10/07/23: -3 to 120; Goal status: PARTIALLY MET  3.  Improve L LE strength 1 MMT grade to facilitate being able to walk x 20 minutes with least restrictive or no AD to resume fishing, light yardwork/housework independently Baseline: 3+/5 Goal status: INITIAL   PLAN:  PT FREQUENCY: 2x/week  PT DURATION: 8 weeks  PLANNED INTERVENTIONS: 97110-Therapeutic exercises, 97530- Therapeutic activity, 97112- Neuromuscular re-education, 97535- Self Care, and 40981- Manual therapy  PLAN FOR NEXT SESSION: manual techniques and strengthening, progress ROM;    Paulita Licklider D Siennah Barrasso PT, DPT, GCS  3:25 PM,10/14/23

## 2023-10-14 ENCOUNTER — Ambulatory Visit

## 2023-10-14 DIAGNOSIS — M6281 Muscle weakness (generalized): Secondary | ICD-10-CM

## 2023-10-14 DIAGNOSIS — M25662 Stiffness of left knee, not elsewhere classified: Secondary | ICD-10-CM

## 2023-10-14 DIAGNOSIS — M25562 Pain in left knee: Secondary | ICD-10-CM | POA: Diagnosis not present

## 2023-10-16 ENCOUNTER — Ambulatory Visit

## 2023-10-16 DIAGNOSIS — M25662 Stiffness of left knee, not elsewhere classified: Secondary | ICD-10-CM | POA: Diagnosis present

## 2023-10-16 DIAGNOSIS — M6281 Muscle weakness (generalized): Secondary | ICD-10-CM | POA: Insufficient documentation

## 2023-10-16 NOTE — Therapy (Signed)
 OUTPATIENT PHYSICAL THERAPY LOWER EXTREMITY TREATMENT/DISCHARGE   Patient Name: Eric English. MRN: [id number] DOB:09/17/1955, 68 y.o., male Today's Date: 10/16/2023  END OF SESSION:  PT End of Session - 10/16/23 2243     Visit Number 11    Number of Visits 17    Date for PT Re-Evaluation 11/02/23    Authorization Type 2x/week x 8 weeks (re-cert needed 4/40/34), [employer] VQQV#[id number] 3/24-5/30 for 19 PT visits  [employer] Dress #[id number] approved for 6 visits between 09/17/2023-11/15/2023  [employer] 2025  VL:30 pt only  Deductible: $1000/$307.06 used  Co ins:20%    Authorization - Visit Number 11    Authorization - Number of Visits 19    PT Start Time 1105    PT Stop Time 1150    PT Time Calculation (min) 45 min    Activity Tolerance Patient tolerated treatment well    Behavior During Therapy WFL for tasks assessed/performed            Past Medical History:  Diagnosis Date   Abnormal glucose    Allergy    pollen   Arthritis    hands / knees   Hemorrhoids    Hypertension    Prostate enlargement    Sleep apnea    Trigger finger    Past Surgical History:  Procedure Laterality Date   COLONOSCOPY  06/16/2004   COLONOSCOPY WITH PROPOFOL  N/A 08/15/2015   Procedure: COLONOSCOPY WITH PROPOFOL ;  Surgeon: Marshall Skeeter, MD;  Location: [facility] ENDOSCOPY;  Service: Endoscopy;  Laterality: N/A;   HERNIA REPAIR  06/17/2007   umbilical   VASECTOMY  06/16/1980   Patient Active Problem List   Diagnosis Date Noted   Dyslipidemia 09/05/2022   Chronic pain of left knee 09/05/2022   Perennial allergic rhinitis with seasonal variation 03/03/2022   Internal derangement of knee 09/21/2020   Morbid obesity (HCC) 02/27/2020   History of elevated PSA 08/12/2019   OSA on CPAP 08/12/2019   Hyperlipidemia associated with type 2 diabetes mellitus (HCC) 03/18/2019   Chronic left shoulder pain 01/04/2018   Chronic seasonal allergic rhinitis due to pollen 09/19/2016   Hemorrhoids 07/15/2016    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   Elevated liver enzymes 10/08/2015   Elevated PSA 07/11/2015   Superficial thrombophlebitis 06/23/2015   Diastasis recti 12/27/2013   Benign essential HTN 03/15/2010    PCP: Dr. Ava Lei, MD  REFERRING PROVIDER: Dr. Swaziland, MD  REFERRING DIAG: s/p hemiarthroplasty L knee (medial compartment)  THERAPY DIAG:  Stiffness of left knee, not elsewhere classified  Muscle weakness (generalized)  Rationale for Evaluation and Treatment: Rehabilitation  ONSET DATE: 09/04/23  SUBJECTIVE: (from initial evaluation note 09/07/23)  SUBJECTIVE STATEMENT: Pt underwent left medial unicompartmental knee arthroplasty on 09/04/23.  He had surgery and then d/c home same day.  Had surgery and then came home from [facility].    C/o tightness/pain in knee, difficulty walking  Has a HEP and worked on it over the weekend   Pain control: Tramadol, pain medication and ice pack, also using a heating pad some pad behind knee knee too    Pt using FWW currently  Pt's wife is present today; has good support- lives part time locally and part time in [place] (for a long time) due to his work  PERTINENT HISTORY: Decided to have surgery b/c of OA; he is very active, was in the [employer] for many years prior to this.   PAIN: Are you having pain? Best 5/10 and worst 10/10  PRECAUTIONS: None  RED FLAGS: None   WEIGHT BEARING RESTRICTIONS: No  FALLS: Has patient fallen in last 6 months? No  LIVING ENVIRONMENT: Lives with: lives with their spouse Lives in: House/apartment Stairs: 3 steps to enter,  14 stairs inside to second floor for bedroom; in [place] he has 2 steps in and then single level once inside Has following equipment at home: Otho Blitz - 2 wheeled  OCCUPATION: desk job; Arts development officer in [place]   PLOF: Independent  PATIENT GOALS: to return to riding a bike, walk, fishing, get back to work- hoping by 10/19/23  NEXT MD VISIT: 09/21/23;  10/12/23  OBJECTIVE:  Note: Objective measures were completed at Evaluation unless otherwise noted.  DIAGNOSTIC FINDINGS: post op  PATIENT SURVEYS: LEFS 20  COGNITION:Overall cognitive status: Within functional limits for tasks assessed     SENSATION:WFL  EDEMA: Typical postop swelling of L LE noted  POSTURE: Pt's FWW appears slightly tall, shoulders shrugged when amb  PALPATION:TTP throughout L knee  LOWER EXTREMITY ROM:  Active ROM Right eval Left eval  Hip flexion    Hip extension    Hip abduction    Hip adduction    Hip internal rotation    Hip external rotation    Knee flexion  80-85  Knee extension  Lacking 5-10  Ankle dorsiflexion    Ankle plantarflexion    Ankle inversion    Ankle eversion     (Blank rows = not tested)  LOWER EXTREMITY MMT:  MMT Right eval Left eval  Hip flexion    Hip extension    Hip abduction    Hip adduction    Hip internal rotation    Hip external rotation    Knee flexion  3+  Knee extension  3+  Ankle dorsiflexion    Ankle plantarflexion    Ankle inversion    Ankle eversion     (Blank rows = not tested)   FUNCTIONAL TESTS:  Pt able to transfer sit to stand and sit to supine independently; uses UE support for sit to stand  GAIT: Distance walked: in/out of clinic Assistive device utilized: Walker - 2 wheeled Level of assistance: Complete Independence Comments: pt with decreased WB on L LE, decreased knee flex during swing phase on L                                                                                                                                TREATMENT DATE: 10/16/23    Subjective: Pt reports that he is doing well today. No significant changes since his last session. No resting pain upon arrival. He is performing his HEP. No specific concerns today.   Pain: No notable resting pain;   Ther-ex  SciFit L4-6 BLE only with therapist gradually progressing seat forward (starting position 11, ending at  position 8) to encourage L knee flexion x 10 minutes during interval history; Standing L knee flexion stretch on second  step of staircase x 60s; 12" step ups leading with LLE up and RLE down x 15; 6" lateral step down heel taps x 10; Total Gym Level 22 double leg squats 2 x 15; TG L22 L single leg squats 2 x 10; Seated L hamstring curls with manual resistance 2 x 10; Seated L LAQ with manual resistance 2 x 10; Extensive HEP review with patient;   PATIENT EDUCATION:  Education details: Pt educated throughout session about proper posture and technique with exercises. Improved exercise technique, movement at target joints, use of target muscles after min to mod verbal, visual, tactile cues.  HEP and discharge. Person educated: Patient Education method: Explanation, Demonstration, Tactile cues, Verbal cues, and Handouts Education comprehension: verbalized understanding and returned demonstration  HOME EXERCISE PROGRAM: GYM/HOME PROGRAM Access Code: [email or web address] URL: [email or web address] Date: 10/16/2023 Prepared by: Crawford Dock  Exercises - Seated Passive Knee Extension  - 2-3 x daily - 7 x weekly - start 2-3 minutes and progress until you can hold stretch for approximately 20 minutes hold - Seated Hamstring Stretch (Mirrored)  - 1 x daily - 7 x weekly - 3-5 reps - 60s hold - Prone Quadriceps Stretch with Strap (Mirrored)  - 1 x daily - 7 x weekly - 3-5 reps - 60s hold - Seated Piriformis Stretch  - 1 x daily - 7 x weekly - 3-5 reps - 60s hold - Seated Figure 4 Piriformis Stretch  - 1 x daily - 7 x weekly - 3-5 reps - 60s hold - Mini Squat with Counter Support  - 1 x daily - 7 x weekly - 2-3 sets - 10 reps - Single Leg Heel Raise with Unilateral Counter Support  - 1 x daily - 7 x weekly - 2-3 sets - 10 reps - Walking Forward Lunge  - 1 x daily - 3-4 x weekly - 3 reps - 30s hold - Standing Hip Abduction with Resistance at Ankles and Counter Support (Mirrored)  - 1 x daily -  3-4 x weekly - 3 sets - 10 reps - Standing Hip Abduction with Resistance at Ankles and Counter Support  - 1 x daily - 3-4 x weekly - 3 sets - 10 reps - Standing Hip Extension with Resistance at Ankles and Counter Support  - 1 x daily - 3-4 x weekly - 3 sets - 10 reps - Standing Hip Extension with Resistance at Ankles and Counter Support (Mirrored)  - 1 x daily - 3-4 x weekly - 3 sets - 10 reps - Squat with Chair Touch  - 1 x daily - 3-4 x weekly - 3 sets - 10 reps - Side Stepping with Resistance at Ankles  - 1 x daily - 3-4 x weekly - 3 reps - 30s hold - Single Leg Knee Extension with Weight Machine  - 1 x daily - 3-4 x weekly - 3 sets - 10-15 reps - Single Leg Hamstring Curl with Weight Machine  - 1 x daily - 3-4 x weekly - 3 sets - 10-15 reps - Single Leg Press (Mirrored)  - 1 x daily - 3-4 x weekly - 3 sets - 10-15 reps - Heel Raises with Leg Press  - 1 x daily - 3-4 x weekly - 3 sets - 10-15 reps - Hip Abduction Machine  - 1 x daily - 3-4 x weekly - 3 sets - 10-15 reps - Hip Adduction Machine  - 1 x daily - 3-4 x weekly - 3 sets - 10-15 reps  AQUATIC PROGRAM Access Code: [email or web address] URL: [email or web address] Date: 10/16/2023 Prepared by: Crawford Dock  Exercises - Forward Walking  - 1 x daily - 3-4 x weekly - 2-3 minutes hold - Backward Walking  - 1 x daily - 3-4 x weekly - 2-3 minutes hold - Side Stepping  - 1 x daily - 3-4 x weekly - 2-3 minutes hold - Standing Hip Abduction Adduction at Pool Wall  - 1 x daily - 3-4 x weekly - 3 reps - 60s hold - Standing Hip Flexion Extension at [employer]  - 1 x daily - 3-4 x weekly - 3 reps - 60s hold - Standing Hip Circles at [employer]  - 1 x daily - 3-4 x weekly - 3 reps - 60s hold - Squat  - 1 x daily - 3-4 x weekly - 3 sets - 10 reps - Single Leg Squat  - 1 x daily - 3-4 x weekly - 3 sets - 10 reps - Alternating Forward Lunge  - 1 x daily - 3-4 x weekly - 3 sets - 10 reps   ASSESSMENT:  CLINICAL IMPRESSION: Progressed  strengthening during session. His ROM has continued to improve and his LEFS increased significantly compared to initial evaluation. He has made great progress since starting with therapy. Extensive time spent today reviewing HEP. Pt provided home, gym, and aquatic based programs. Pt encouraged to follow-up with surgeon as advised.  OBJECTIVE IMPAIRMENTS: Abnormal gait, decreased activity tolerance, decreased endurance, decreased mobility, difficulty walking, decreased ROM, decreased strength, impaired perceived functional ability, and pain.   ACTIVITY LIMITATIONS: carrying, lifting, bending, sitting, standing, squatting, sleeping, stairs, transfers, and locomotion level  PARTICIPATION LIMITATIONS: meal prep, cleaning, interpersonal relationship, driving, community activity, occupation, and yard work  PERSONAL FACTORS: Age and Time since onset of injury/illness/exacerbation are also affecting patient's functional outcome.   REHAB POTENTIAL: Excellent  CLINICAL DECISION MAKING: Stable/uncomplicated  EVALUATION COMPLEXITY: Low   GOALS: Goals reviewed with patient? Yes  SHORT TERM GOALS: Target date: 09/29/23 Pt will amb with achieving L heel strike and normal swing through gait mechanics on L LE with least restrictive AD x 10 min on flat surfaces Baseline: heel strike impaired, knee flex impaired Goal status: ACHIEVED   LONG TERM GOALS: Target date: 11/02/23  Improve LEFS >18 (MDC 9) indicating pt able to perform his daily and work activities without being as limited by L knee Baseline: 20; 10/07/23: 59/80; Goal status: ACHIEVED  2.  Pt's L knee AROM will improve 0-120 to facilitate being able to amb, ascend/descend stairs, ride in car, get up/down from sitting with normal LE mechanics and not being limited by L knee tightness Baseline: flexion 85; 09/30/23: -3 to 113; 10/07/23: -3 to 120; Goal status: PARTIALLY MET  3.  Improve L LE strength 1 MMT grade to facilitate being able to walk x  20 minutes with least restrictive or no AD to resume fishing, light yardwork/housework independently Baseline: 3+/5 Goal status: ACHIEVED   PLAN:  PT FREQUENCY: 2x/week  PT DURATION: 8 weeks  PLANNED INTERVENTIONS: 97110-Therapeutic exercises, 97530- Therapeutic activity, [id number]- Neuromuscular re-education, 97535- Self Care, and 84132- Manual therapy  PLAN FOR NEXT SESSION: Discharge   Sherill Ding Sayge Brienza PT, DPT, GCS  10:49 PM,10/16/23

## 2023-10-19 ENCOUNTER — Ambulatory Visit

## 2023-10-21 ENCOUNTER — Ambulatory Visit

## 2023-10-23 ENCOUNTER — Ambulatory Visit: Payer: Self-pay | Admitting: Family Medicine

## 2023-10-26 ENCOUNTER — Encounter

## 2023-10-28 ENCOUNTER — Encounter

## 2023-11-02 ENCOUNTER — Encounter

## 2023-11-04 ENCOUNTER — Encounter

## 2023-11-10 ENCOUNTER — Encounter

## 2023-11-12 ENCOUNTER — Encounter

## 2023-11-25 ENCOUNTER — Other Ambulatory Visit: Payer: Self-pay | Admitting: Internal Medicine

## 2023-11-25 DIAGNOSIS — E1165 Type 2 diabetes mellitus with hyperglycemia: Secondary | ICD-10-CM

## 2023-11-26 NOTE — Telephone Encounter (Signed)
 Requested medications are due for refill today.  yes  Requested medications are on the active medications list.  yes  Last refill. 04/27/2023 #90 1 rf  Future visit scheduled.   yes  Notes to clinic.  Expired labs.    Requested Prescriptions  Pending Prescriptions Disp Refills   metFORMIN  (GLUCOPHAGE -XR) 500 MG 24 hr tablet [Pharmacy Med Name: metFORMIN  HCl ER 500 MG Oral Tablet Extended Release 24 Hour] 90 tablet 0    Sig: Take 1 tablet by mouth once daily with breakfast     Endocrinology:  Diabetes - Biguanides Failed - 11/26/2023  1:38 PM      Failed - Cr in normal range and within 360 days    Creat  Date Value Ref Range Status  09/05/2022 0.94 0.70 - 1.35 mg/dL Final   Creatinine, Urine  Date Value Ref Range Status  04/27/2023 153 20 - 320 mg/dL Final         Failed - HBA1C is between 0 and 7.9 and within 180 days    Hemoglobin A1C  Date Value Ref Range Status  04/27/2023 6.4 (A) 4.0 - 5.6 % Final   Hgb A1c MFr Bld  Date Value Ref Range Status  09/05/2022 6.6 (H) <5.7 % of total Hgb Final    Comment:    For someone without known diabetes, a hemoglobin A1c value of 6.5% or greater indicates that they may have  diabetes and this should be confirmed with a follow-up  test. . For someone with known diabetes, a value <7% indicates  that their diabetes is well controlled and a value  greater than or equal to 7% indicates suboptimal  control. A1c targets should be individualized based on  duration of diabetes, age, comorbid conditions, and  other considerations. . Currently, no consensus exists regarding use of hemoglobin A1c for diagnosis of diabetes for children. .          Failed - eGFR in normal range and within 360 days    GFR, Est African American  Date Value Ref Range Status  02/27/2020 95 > OR = 60 mL/min/1.75m2 Final   GFR, Est Non African American  Date Value Ref Range Status  02/27/2020 82 > OR = 60 mL/min/1.21m2 Final   eGFR  Date Value Ref  Range Status  09/05/2022 88 > OR = 60 mL/min/1.64m2 Final  07/01/2021 77 >59 mL/min/1.73 Final         Failed - B12 Level in normal range and within 720 days    No results found for: VITAMINB12       Failed - CBC within normal limits and completed in the last 12 months    WBC  Date Value Ref Range Status  09/05/2022 6.3 3.8 - 10.8 Thousand/uL Final   RBC  Date Value Ref Range Status  09/05/2022 4.51 4.20 - 5.80 Million/uL Final   Hemoglobin  Date Value Ref Range Status  09/05/2022 13.3 13.2 - 17.1 g/dL Final  16/03/9603 54.0 13.0 - 17.7 g/dL Final   HCT  Date Value Ref Range Status  09/05/2022 40.0 38.5 - 50.0 % Final   Hematocrit  Date Value Ref Range Status  07/01/2021 39.4 37.5 - 51.0 % Final   MCHC  Date Value Ref Range Status  09/05/2022 33.3 32.0 - 36.0 g/dL Final   [facility]  Date Value Ref Range Status  09/05/2022 29.5 27.0 - 33.0 pg Final   MCV  Date Value Ref Range Status  09/05/2022 88.7 80.0 - 100.0 fL Final  07/01/2021 86 79 - 97 fL Final   No results found for: PLTCOUNTKUC, LABPLAT, POCPLA RDW  Date Value Ref Range Status  09/05/2022 13.1 11.0 - 15.0 % Final  07/01/2021 13.0 11.6 - 15.4 % Final         Passed - Valid encounter within last 6 months    Recent Outpatient Visits   None

## 2023-12-08 ENCOUNTER — Other Ambulatory Visit: Payer: Self-pay | Admitting: Internal Medicine

## 2023-12-08 DIAGNOSIS — E1165 Type 2 diabetes mellitus with hyperglycemia: Secondary | ICD-10-CM

## 2023-12-10 NOTE — Telephone Encounter (Signed)
 Requested medication (s) are due for refill today:  Yes  Requested medication (s) are on the active medication list:   Yes  Future visit scheduled:   Yes 10/14/2024   LOV 04/27/2023   Appt on 10/23/2023 was a No Show   Last ordered: 04/27/2023 #90, 1 refill  Unable to refill because labs are due and was a No Show for last appt.       Requested Prescriptions  Pending Prescriptions Disp Refills   metFORMIN  (GLUCOPHAGE -XR) 500 MG 24 hr tablet [Pharmacy Med Name: metFORMIN  HCl ER 500 MG Oral Tablet Extended Release 24 Hour] 90 tablet 0    Sig: Take 1 tablet by mouth once daily with breakfast     Endocrinology:  Diabetes - Biguanides Failed - 12/10/2023  1:18 PM      Failed - Cr in normal range and within 360 days    Creat  Date Value Ref Range Status  09/05/2022 0.94 0.70 - 1.35 mg/dL Final   Creatinine, Urine  Date Value Ref Range Status  04/27/2023 153 20 - 320 mg/dL Final         Failed - HBA1C is between 0 and 7.9 and within 180 days    Hemoglobin A1C  Date Value Ref Range Status  04/27/2023 6.4 (A) 4.0 - 5.6 % Final   Hgb A1c MFr Bld  Date Value Ref Range Status  09/05/2022 6.6 (H) <5.7 % of total Hgb Final    Comment:    For someone without known diabetes, a hemoglobin A1c value of 6.5% or greater indicates that they may have  diabetes and this should be confirmed with a follow-up  test. . For someone with known diabetes, a value <7% indicates  that their diabetes is well controlled and a value  greater than or equal to 7% indicates suboptimal  control. A1c targets should be individualized based on  duration of diabetes, age, comorbid conditions, and  other considerations. . Currently, no consensus exists regarding use of hemoglobin A1c for diagnosis of diabetes for children. .          Failed - eGFR in normal range and within 360 days    GFR, Est African American  Date Value Ref Range Status  02/27/2020 95 > OR = 60 mL/min/1.80m2 Final   GFR, Est Non  African American  Date Value Ref Range Status  02/27/2020 82 > OR = 60 mL/min/1.79m2 Final   eGFR  Date Value Ref Range Status  09/05/2022 88 > OR = 60 mL/min/1.39m2 Final  07/01/2021 77 >59 mL/min/1.73 Final         Failed - B12 Level in normal range and within 720 days    No results found for: VITAMINB12       Failed - CBC within normal limits and completed in the last 12 months    WBC  Date Value Ref Range Status  09/05/2022 6.3 3.8 - 10.8 Thousand/uL Final   RBC  Date Value Ref Range Status  09/05/2022 4.51 4.20 - 5.80 Million/uL Final   Hemoglobin  Date Value Ref Range Status  09/05/2022 13.3 13.2 - 17.1 g/dL Final  98/83/7976 86.4 13.0 - 17.7 g/dL Final   HCT  Date Value Ref Range Status  09/05/2022 40.0 38.5 - 50.0 % Final   Hematocrit  Date Value Ref Range Status  07/01/2021 39.4 37.5 - 51.0 % Final   MCHC  Date Value Ref Range Status  09/05/2022 33.3 32.0 - 36.0 g/dL Final   [place]  Date Value Ref Range Status  09/05/2022 29.5 27.0 - 33.0 pg Final   MCV  Date Value Ref Range Status  09/05/2022 88.7 80.0 - 100.0 fL Final  07/01/2021 86 79 - 97 fL Final   No results found for: PLTCOUNTKUC, LABPLAT, POCPLA RDW  Date Value Ref Range Status  09/05/2022 13.1 11.0 - 15.0 % Final  07/01/2021 13.0 11.6 - 15.4 % Final         Passed - Valid encounter within last 6 months    Recent Outpatient Visits   None

## 2023-12-21 ENCOUNTER — Other Ambulatory Visit: Payer: Self-pay | Admitting: Internal Medicine

## 2023-12-21 DIAGNOSIS — E1165 Type 2 diabetes mellitus with hyperglycemia: Secondary | ICD-10-CM

## 2023-12-21 DIAGNOSIS — I10 Essential (primary) hypertension: Secondary | ICD-10-CM

## 2023-12-22 NOTE — Telephone Encounter (Signed)
 Requested medication (s) are due for refill today - yes  Requested medication (s) are on the active medication list -yes  Future visit scheduled -no  Last refill: 04/27/23 #90 1RF-both  Notes to clinic: fails lab protocol- over 1 year-09/05/22, over due 6 month visit- sent for review   Requested Prescriptions  Pending Prescriptions Disp Refills   metFORMIN  (GLUCOPHAGE -XR) 500 MG 24 hr tablet [Pharmacy Med Name: metFORMIN  HCl ER 500 MG Oral Tablet Extended Release 24 Hour] 90 tablet 0    Sig: Take 1 tablet by mouth once daily with breakfast     Endocrinology:  Diabetes - Biguanides Failed - 12/22/2023  4:26 PM      Failed - Cr in normal range and within 360 days    Creat  Date Value Ref Range Status  09/05/2022 0.94 0.70 - 1.35 mg/dL Final   Creatinine, Urine  Date Value Ref Range Status  04/27/2023 153 20 - 320 mg/dL Final         Failed - HBA1C is between 0 and 7.9 and within 180 days    Hemoglobin A1C  Date Value Ref Range Status  04/27/2023 6.4 (A) 4.0 - 5.6 % Final   Hgb A1c MFr Bld  Date Value Ref Range Status  09/05/2022 6.6 (H) <5.7 % of total Hgb Final    Comment:    For someone without known diabetes, a hemoglobin A1c value of 6.5% or greater indicates that they may have  diabetes and this should be confirmed with a follow-up  test. . For someone with known diabetes, a value <7% indicates  that their diabetes is well controlled and a value  greater than or equal to 7% indicates suboptimal  control. A1c targets should be individualized based on  duration of diabetes, age, comorbid conditions, and  other considerations. . Currently, no consensus exists regarding use of hemoglobin A1c for diagnosis of diabetes for children. .          Failed - eGFR in normal range and within 360 days    GFR, Est African American  Date Value Ref Range Status  02/27/2020 95 > OR = 60 mL/min/1.3m2 Final   GFR, Est Non African American  Date Value Ref Range Status   02/27/2020 82 > OR = 60 mL/min/1.65m2 Final   eGFR  Date Value Ref Range Status  09/05/2022 88 > OR = 60 mL/min/1.66m2 Final  07/01/2021 77 >59 mL/min/1.73 Final         Failed - B12 Level in normal range and within 720 days    No results found for: VITAMINB12       Failed - CBC within normal limits and completed in the last 12 months    WBC  Date Value Ref Range Status  09/05/2022 6.3 3.8 - 10.8 Thousand/uL Final   RBC  Date Value Ref Range Status  09/05/2022 4.51 4.20 - 5.80 Million/uL Final   Hemoglobin  Date Value Ref Range Status  09/05/2022 13.3 13.2 - 17.1 g/dL Final  98/83/7976 86.4 13.0 - 17.7 g/dL Final   HCT  Date Value Ref Range Status  09/05/2022 40.0 38.5 - 50.0 % Final   Hematocrit  Date Value Ref Range Status  07/01/2021 39.4 37.5 - 51.0 % Final   MCHC  Date Value Ref Range Status  09/05/2022 33.3 32.0 - 36.0 g/dL Final   [facility]  Date Value Ref Range Status  09/05/2022 29.5 27.0 - 33.0 pg Final   MCV  Date Value Ref Range Status  09/05/2022 88.7 80.0 - 100.0 fL Final  07/01/2021 86 79 - 97 fL Final   No results found for: PLTCOUNTKUC, LABPLAT, POCPLA RDW  Date Value Ref Range Status  09/05/2022 13.1 11.0 - 15.0 % Final  07/01/2021 13.0 11.6 - 15.4 % Final         Passed - Valid encounter within last 6 months    Recent Outpatient Visits   None             lisinopril -hydrochlorothiazide  (ZESTORETIC ) 20-25 MG tablet [Pharmacy Med Name: Lisinopril -hydroCHLOROthiazide  20-25 MG Oral Tablet] 90 tablet 0    Sig: Take 1 tablet by mouth once daily     Cardiovascular:  ACEI + Diuretic Combos Failed - 12/22/2023  4:26 PM      Failed - Na in normal range and within 180 days    Sodium  Date Value Ref Range Status  09/05/2022 139 135 - 146 mmol/L Final  07/01/2021 137 134 - 144 mmol/L Final         Failed - K in normal range and within 180 days    Potassium  Date Value Ref Range Status  09/05/2022 4.1 3.5 - 5.3 mmol/L Final          Failed - Cr in normal range and within 180 days    Creat  Date Value Ref Range Status  09/05/2022 0.94 0.70 - 1.35 mg/dL Final   Creatinine, Urine  Date Value Ref Range Status  04/27/2023 153 20 - 320 mg/dL Final         Failed - eGFR is 30 or above and within 180 days    GFR, Est African American  Date Value Ref Range Status  02/27/2020 95 > OR = 60 mL/min/1.28m2 Final   GFR, Est Non African American  Date Value Ref Range Status  02/27/2020 82 > OR = 60 mL/min/1.61m2 Final   eGFR  Date Value Ref Range Status  09/05/2022 88 > OR = 60 mL/min/1.31m2 Final  07/01/2021 77 >59 mL/min/1.73 Final         Passed - Patient is not pregnant      Passed - Last BP in normal range    BP Readings from Last 1 Encounters:  04/27/23 134/82         Passed - Valid encounter within last 6 months    Recent Outpatient Visits   None               Requested Prescriptions  Pending Prescriptions Disp Refills   metFORMIN  (GLUCOPHAGE -XR) 500 MG 24 hr tablet [Pharmacy Med Name: metFORMIN  HCl ER 500 MG Oral Tablet Extended Release 24 Hour] 90 tablet 0    Sig: Take 1 tablet by mouth once daily with breakfast     Endocrinology:  Diabetes - Biguanides Failed - 12/22/2023  4:26 PM      Failed - Cr in normal range and within 360 days    Creat  Date Value Ref Range Status  09/05/2022 0.94 0.70 - 1.35 mg/dL Final   Creatinine, Urine  Date Value Ref Range Status  04/27/2023 153 20 - 320 mg/dL Final         Failed - HBA1C is between 0 and 7.9 and within 180 days    Hemoglobin A1C  Date Value Ref Range Status  04/27/2023 6.4 (A) 4.0 - 5.6 % Final   Hgb A1c MFr Bld  Date Value Ref Range Status  09/05/2022 6.6 (H) <5.7 % of total Hgb Final  Comment:    For someone without known diabetes, a hemoglobin A1c value of 6.5% or greater indicates that they may have  diabetes and this should be confirmed with a follow-up  test. . For someone with known diabetes, a value <7% indicates  that  their diabetes is well controlled and a value  greater than or equal to 7% indicates suboptimal  control. A1c targets should be individualized based on  duration of diabetes, age, comorbid conditions, and  other considerations. . Currently, no consensus exists regarding use of hemoglobin A1c for diagnosis of diabetes for children. .          Failed - eGFR in normal range and within 360 days    GFR, Est African American  Date Value Ref Range Status  02/27/2020 95 > OR = 60 mL/min/1.62m2 Final   GFR, Est Non African American  Date Value Ref Range Status  02/27/2020 82 > OR = 60 mL/min/1.34m2 Final   eGFR  Date Value Ref Range Status  09/05/2022 88 > OR = 60 mL/min/1.108m2 Final  07/01/2021 77 >59 mL/min/1.73 Final         Failed - B12 Level in normal range and within 720 days    No results found for: VITAMINB12       Failed - CBC within normal limits and completed in the last 12 months    WBC  Date Value Ref Range Status  09/05/2022 6.3 3.8 - 10.8 Thousand/uL Final   RBC  Date Value Ref Range Status  09/05/2022 4.51 4.20 - 5.80 Million/uL Final   Hemoglobin  Date Value Ref Range Status  09/05/2022 13.3 13.2 - 17.1 g/dL Final  98/83/7976 86.4 13.0 - 17.7 g/dL Final   HCT  Date Value Ref Range Status  09/05/2022 40.0 38.5 - 50.0 % Final   Hematocrit  Date Value Ref Range Status  07/01/2021 39.4 37.5 - 51.0 % Final   MCHC  Date Value Ref Range Status  09/05/2022 33.3 32.0 - 36.0 g/dL Final   [facility]  Date Value Ref Range Status  09/05/2022 29.5 27.0 - 33.0 pg Final   MCV  Date Value Ref Range Status  09/05/2022 88.7 80.0 - 100.0 fL Final  07/01/2021 86 79 - 97 fL Final   No results found for: PLTCOUNTKUC, LABPLAT, POCPLA RDW  Date Value Ref Range Status  09/05/2022 13.1 11.0 - 15.0 % Final  07/01/2021 13.0 11.6 - 15.4 % Final         Passed - Valid encounter within last 6 months    Recent Outpatient Visits   None              lisinopril -hydrochlorothiazide  (ZESTORETIC ) 20-25 MG tablet [Pharmacy Med Name: Lisinopril -hydroCHLOROthiazide  20-25 MG Oral Tablet] 90 tablet 0    Sig: Take 1 tablet by mouth once daily     Cardiovascular:  ACEI + Diuretic Combos Failed - 12/22/2023  4:26 PM      Failed - Na in normal range and within 180 days    Sodium  Date Value Ref Range Status  09/05/2022 139 135 - 146 mmol/L Final  07/01/2021 137 134 - 144 mmol/L Final         Failed - K in normal range and within 180 days    Potassium  Date Value Ref Range Status  09/05/2022 4.1 3.5 - 5.3 mmol/L Final         Failed - Cr in normal range and within 180 days    Creat  Date Value Ref Range Status  09/05/2022 0.94 0.70 - 1.35 mg/dL Final   Creatinine, Urine  Date Value Ref Range Status  04/27/2023 153 20 - 320 mg/dL Final         Failed - eGFR is 30 or above and within 180 days    GFR, Est African American  Date Value Ref Range Status  02/27/2020 95 > OR = 60 mL/min/1.48m2 Final   GFR, Est Non African American  Date Value Ref Range Status  02/27/2020 82 > OR = 60 mL/min/1.19m2 Final   eGFR  Date Value Ref Range Status  09/05/2022 88 > OR = 60 mL/min/1.47m2 Final  07/01/2021 77 >59 mL/min/1.73 Final         Passed - Patient is not pregnant      Passed - Last BP in normal range    BP Readings from Last 1 Encounters:  04/27/23 134/82         Passed - Valid encounter within last 6 months    Recent Outpatient Visits   None

## 2024-01-19 ENCOUNTER — Other Ambulatory Visit: Payer: Self-pay | Admitting: Internal Medicine

## 2024-01-19 DIAGNOSIS — E785 Hyperlipidemia, unspecified: Secondary | ICD-10-CM

## 2024-01-20 NOTE — Telephone Encounter (Signed)
 Requested medication (s) are due for refill today - yes  Requested medication (s) are on the active medication list -yes  Future visit scheduled -no  Last refill: 04/27/23 #90 1RF  Notes to clinic: fails lab protocol- over 1 year- 09/05/22  Requested Prescriptions  Pending Prescriptions Disp Refills   atorvastatin  (LIPITOR) 40 MG tablet [Pharmacy Med Name: Atorvastatin  Calcium  40 MG Oral Tablet] 90 tablet 0    Sig: Take 1 tablet by mouth once daily     Cardiovascular:  Antilipid - Statins Failed - 01/20/2024  3:20 PM      Failed - Lipid Panel in normal range within the last 12 months    Cholesterol, Total  Date Value Ref Range Status  07/01/2021 90 (L) 100 - 199 mg/dL Final   Cholesterol  Date Value Ref Range Status  09/05/2022 84 <200 mg/dL Final   LDL Cholesterol (Calc)  Date Value Ref Range Status  09/05/2022 33 mg/dL (calc) Final    Comment:    Reference range: <100 . Desirable range <100 mg/dL for primary prevention;   <70 mg/dL for patients with CHD or diabetic patients  with > or = 2 CHD risk factors. SABRA LDL-C is now calculated using the Martin-Hopkins  calculation, which is a validated novel method providing  better accuracy than the Friedewald equation in the  estimation of LDL-C.  Gladis APPLETHWAITE et al. SANDREA. 7986;689(80): 2061-2068  (http://education.[facility].com/faq/FAQ164)    HDL  Date Value Ref Range Status  09/05/2022 37 (L) > OR = 40 mg/dL Final  98/83/7976 33 (L) >39 mg/dL Final   Triglycerides  Date Value Ref Range Status  09/05/2022 67 <150 mg/dL Final         Passed - Patient is not pregnant      Passed - Valid encounter within last 12 months    Recent Outpatient Visits   None               Requested Prescriptions  Pending Prescriptions Disp Refills   atorvastatin  (LIPITOR) 40 MG tablet [Pharmacy Med Name: Atorvastatin  Calcium  40 MG Oral Tablet] 90 tablet 0    Sig: Take 1 tablet by mouth once daily     Cardiovascular:   Antilipid - Statins Failed - 01/20/2024  3:20 PM      Failed - Lipid Panel in normal range within the last 12 months    Cholesterol, Total  Date Value Ref Range Status  07/01/2021 90 (L) 100 - 199 mg/dL Final   Cholesterol  Date Value Ref Range Status  09/05/2022 84 <200 mg/dL Final   LDL Cholesterol (Calc)  Date Value Ref Range Status  09/05/2022 33 mg/dL (calc) Final    Comment:    Reference range: <100 . Desirable range <100 mg/dL for primary prevention;   <70 mg/dL for patients with CHD or diabetic patients  with > or = 2 CHD risk factors. SABRA LDL-C is now calculated using the Martin-Hopkins  calculation, which is a validated novel method providing  better accuracy than the Friedewald equation in the  estimation of LDL-C.  Gladis APPLETHWAITE et al. SANDREA. 7986;689(80): 2061-2068  (http://education.[facility].com/faq/FAQ164)    HDL  Date Value Ref Range Status  09/05/2022 37 (L) > OR = 40 mg/dL Final  98/83/7976 33 (L) >39 mg/dL Final   Triglycerides  Date Value Ref Range Status  09/05/2022 67 <150 mg/dL Final         Passed - Patient is not pregnant      Passed - Valid encounter  within last 12 months    Recent Outpatient Visits   None

## 2024-01-27 ENCOUNTER — Other Ambulatory Visit: Payer: Self-pay | Admitting: Internal Medicine

## 2024-01-27 DIAGNOSIS — E785 Hyperlipidemia, unspecified: Secondary | ICD-10-CM

## 2024-01-29 NOTE — Telephone Encounter (Signed)
 Pt. Has appointment. Requested Prescriptions  Pending Prescriptions Disp Refills   atorvastatin  (LIPITOR) 40 MG tablet [Pharmacy Med Name: Atorvastatin  Calcium  40 MG Oral Tablet] 90 tablet 0    Sig: Take 1 tablet by mouth once daily     Cardiovascular:  Antilipid - Statins Failed - 01/29/2024  2:53 PM      Failed - Lipid Panel in normal range within the last 12 months    Cholesterol, Total  Date Value Ref Range Status  07/01/2021 90 (L) 100 - 199 mg/dL Final   Cholesterol  Date Value Ref Range Status  09/05/2022 84 <200 mg/dL Final   LDL Cholesterol (Calc)  Date Value Ref Range Status  09/05/2022 33 mg/dL (calc) Final    Comment:    Reference range: <100 . Desirable range <100 mg/dL for primary prevention;   <70 mg/dL for patients with CHD or diabetic patients  with > or = 2 CHD risk factors. SABRA LDL-C is now calculated using the Martin-Hopkins  calculation, which is a validated novel method providing  better accuracy than the Friedewald equation in the  estimation of LDL-C.  Gladis APPLETHWAITE et al. SANDREA. 7986;689(80): 2061-2068  (http://education.[facility].com/faq/FAQ164)    HDL  Date Value Ref Range Status  09/05/2022 37 (L) > OR = 40 mg/dL Final  98/83/7976 33 (L) >39 mg/dL Final   Triglycerides  Date Value Ref Range Status  09/05/2022 67 <150 mg/dL Final         Passed - Patient is not pregnant      Passed - Valid encounter within last 12 months    Recent Outpatient Visits   None

## 2024-02-05 ENCOUNTER — Encounter: Payer: Self-pay | Admitting: Family Medicine

## 2024-02-05 ENCOUNTER — Ambulatory Visit (INDEPENDENT_AMBULATORY_CARE_PROVIDER_SITE_OTHER): Admitting: Family Medicine

## 2024-02-05 ENCOUNTER — Ambulatory Visit: Admitting: Family Medicine

## 2024-02-05 VITALS — BP 130/72 | HR 80 | Resp 16 | Ht 71.0 in | Wt 276.9 lb

## 2024-02-05 DIAGNOSIS — Z125 Encounter for screening for malignant neoplasm of prostate: Secondary | ICD-10-CM

## 2024-02-05 DIAGNOSIS — E1169 Type 2 diabetes mellitus with other specified complication: Secondary | ICD-10-CM | POA: Diagnosis not present

## 2024-02-05 DIAGNOSIS — I10 Essential (primary) hypertension: Secondary | ICD-10-CM | POA: Diagnosis not present

## 2024-02-05 DIAGNOSIS — Z96659 Presence of unspecified artificial knee joint: Secondary | ICD-10-CM

## 2024-02-05 DIAGNOSIS — J3089 Other allergic rhinitis: Secondary | ICD-10-CM

## 2024-02-05 DIAGNOSIS — J302 Other seasonal allergic rhinitis: Secondary | ICD-10-CM

## 2024-02-05 DIAGNOSIS — E785 Hyperlipidemia, unspecified: Secondary | ICD-10-CM

## 2024-02-05 DIAGNOSIS — R0602 Shortness of breath: Secondary | ICD-10-CM

## 2024-02-05 DIAGNOSIS — G4733 Obstructive sleep apnea (adult) (pediatric): Secondary | ICD-10-CM | POA: Diagnosis not present

## 2024-02-05 DIAGNOSIS — Z7984 Long term (current) use of oral hypoglycemic drugs: Secondary | ICD-10-CM

## 2024-02-05 LAB — POCT GLYCOSYLATED HEMOGLOBIN (HGB A1C): Hemoglobin A1C: 6.5 % — AB (ref 4.0–5.6)

## 2024-02-05 MED ORDER — METFORMIN HCL ER 500 MG PO TB24: 500.0000 mg | ORAL_TABLET | Freq: Every day | ORAL | 1 refills | Status: DC

## 2024-02-05 MED ORDER — ATORVASTATIN CALCIUM 40 MG PO TABS: 40.0000 mg | ORAL_TABLET | Freq: Every day | ORAL | 1 refills | Status: DC

## 2024-02-05 MED ORDER — LISINOPRIL-HYDROCHLOROTHIAZIDE 20-25 MG PO TABS: 1.0000 | ORAL_TABLET | Freq: Every day | ORAL | 1 refills | Status: DC

## 2024-02-05 NOTE — Progress Notes (Signed)
 Name: Eric English.   MRN: [id number]    DOB: Dec 06, 1955   Date:02/05/2024       Progress Note  Subjective  Chief Complaint  Chief Complaint  Patient presents with   Medical Management of Chronic Issues    Req from [place]  Eye DM exam   Discussed the use of AI scribe software for clinical note transcription with the patient, who gave verbal consent to proceed.  History of Present Illness Eric English. is a 68 year old male who presents for a regular follow-up and preoperative clearance for cataract surgery.  He requires medical clearance for upcoming cataract surgery on both eyes. He has received his surgical drops and the procedure is scheduled to occur in [place] .  He has type 2 diabetes managed with metformin , taken once daily. His recent A1c today is 6.5%. He has gained approximately 12 pounds since April, attributed to decreased activity following knee surgery. He denies polyphagia, polydipsia or polyuria. He has a sedentary job  He underwent a partial left knee replacement in April. Post-surgery, he continue to experience swelling in the knee, particularly by the end of the day. He can bend his knee well and can kneel with some discomfort due to the scar. He uses foam padding when kneeling.  He experiences shortness of breath with activity, which began approximately a year ago. He attributes this to weight gain and decreased activity post-knee surgery. He has a history of coronary artery calcification in the 40th percentile  and hypertension, for which he takes atorvastatin  and lisinopril  HCTZ 20/25. He was seen by cardiologist last year.   He uses a CPAP machine for obstructive sleep apnea but requires a prescription for new supplies. He visited a sleep medicine facility but was informed he needs a prescription.  He has a history of allergies, managed with loratadine  10 mg daily, and uses a nasal spray as needed. He experiences head pressure and raspy throat intermittently  which he attributes to allergies.  He has skin tags and seborrheic keratosis. He has a history of elevated PSA levels, which have decreased.    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   Elevated liver enzymes 10/08/2015   Elevated PSA 07/11/2015   Superficial thrombophlebitis 06/23/2015   Diastasis recti 12/27/2013   Benign essential HTN 03/15/2010    Past Surgical History:  Procedure Laterality Date   COLONOSCOPY  06/16/2004   COLONOSCOPY WITH PROPOFOL  N/A 08/15/2015   Procedure: COLONOSCOPY WITH PROPOFOL ;  Surgeon: Reyes LELON Cota, MD;  Location: [facility] ENDOSCOPY;  Service: Endoscopy;  Laterality: N/A;   HERNIA REPAIR  06/17/2007   umbilical   MEDIAL PARTIAL KNEE REPLACEMENT Left 09/2023   VASECTOMY  06/16/1980    Family History  Problem Relation Age of Onset   Diabetes Mother    Hypertension Mother    CAD Mother    Hyperlipidemia Mother    Heart disease Father    CAD Father    Diabetes Father  Alcohol abuse Father    Hypertension Sister    Scleroderma Sister    Alcohol abuse Brother     Social History   Tobacco Use   Smoking status: Former    Current packs/day: 0.00    Average packs/day: 1 pack/day for 24.0 years (24.0 ttl pk-yrs)    Types: Cigarettes    Start date: 09/14/1973    Quit date: 09/14/1997    Years since quitting: 26.4   Smokeless tobacco: Never  Substance Use Topics   Alcohol use: Yes    Alcohol/week: 2.0 - 3.0  standard drinks of alcohol    Types: 1 Glasses of wine, 1 - 2 Cans of beer per week    Comment: Occasionally     Current Outpatient Medications:    acetaminophen  (TYLENOL ) 500 MG tablet, Take 1 tablet (500 mg total) by mouth every 6 (six) hours as needed., Disp: 100 tablet, Rfl: 0   atorvastatin  (LIPITOR) 40 MG tablet, Take 1 tablet by mouth once daily, Disp: 90 tablet, Rfl: 0   B Complex Vitamins (VITAMIN B COMPLEX PO), Take 1 tablet by mouth daily., Disp: , Rfl:    lisinopril -hydrochlorothiazide  (ZESTORETIC ) 20-25 MG tablet, Take 1 tablet by mouth once daily, Disp: 30 tablet, Rfl: 1   loratadine  (CLARITIN ) 10 MG tablet, Take 1 tablet (10 mg total) by mouth daily., Disp: 90 tablet, Rfl: 3   metFORMIN  (GLUCOPHAGE -XR) 500 MG 24 hr tablet, Take 1 tablet by mouth once daily with breakfast, Disp: 30 tablet, Rfl: 1   Omega-3 Fatty Acids (FISH OIL) 1200 MG CAPS, Take 1 capsule by mouth 2 (two) times daily., Disp: , Rfl:   No Known Allergies  I personally reviewed active problem list, medication list, allergies with the patient/caregiver today.   ROS  Ten systems reviewed and is negative except as mentioned in HPI    Objective Physical Exam  CONSTITUTIONAL: Patient appears well-developed and well-nourished. No distress. HEENT: Head atraumatic, normocephalic, neck supple. CARDIOVASCULAR: Normal rate, regular rhythm and normal heart sounds. No murmur heard. Mild swelling in left knee but normal rom  PULMONARY: Effort normal and breath sounds normal. No respiratory distress. ABDOMINAL: There is no tenderness or distention. MUSCULOSKELETAL: Normal gait. Without gross motor or sensory deficit. PSYCHIATRIC: Patient has a normal mood and affect. Behavior is normal. Judgment and thought content normal.  Vitals:   02/05/24 0835  BP: 130/72  Pulse: 80  Resp: 16  SpO2: 96%  Weight: 276 lb 14.4 oz (125.6 kg)  Height: 5' 11 (1.803 m)    Body mass index is 38.62 kg/m.  Recent Results  (from the past 2160 hours)  POCT glycosylated hemoglobin (Hb A1C)     Status: Abnormal   Collection Time: 02/05/24  8:44 AM  Result Value Ref Range   Hemoglobin A1C 6.5 (A) 4.0 - 5.6 %   HbA1c POC (<> result, manual entry)     HbA1c, POC (prediabetic range)     HbA1c, POC (controlled diabetic range)      Diabetic Foot Exam:     PHQ2/9:    02/05/2024    8:29 AM 10/08/2023    1:39 PM 04/27/2023    2:59 PM 10/03/2022    1:40 PM 09/05/2022    9:42 AM  Depression screen PHQ 2/9  Decreased Interest 0 0 0 0 0  Down, Depressed, Hopeless 0 0 0 0 0  PHQ - 2 Score 0 0 0 0 0  Altered sleeping  1 0 0 0  Tired, decreased energy  0 0 0 0  Change in appetite  0 0 0 0  Feeling bad or failure about yourself   0 0 0 0  Trouble concentrating  0 0 0 0  Moving slowly or fidgety/restless  0 0 0 0  Suicidal thoughts  0 0 0 0  PHQ-9 Score  1 0 0 0  Difficult doing work/chores  Not difficult at all Not difficult at all      phq 9 is negative  Fall Risk:    02/05/2024    8:28 AM 10/08/2023    1:38 PM 04/27/2023    2:59 PM 10/03/2022    1:33 PM 09/05/2022    9:42 AM  Fall Risk   Falls in the past year? 0 0 0 0 0  Number falls in past yr: 0 0 0 0   Injury with Fall? 0 0 0 0   Risk for fall due to : No Fall Risks No Fall Risks  No Fall Risks No Fall Risks  Follow up Falls evaluation completed Falls prevention discussed;Falls evaluation completed  Education provided;Falls prevention discussed Falls prevention discussed;Education provided;Falls evaluation completed      Assessment & Plan Preoperative Medical Clearance for Cataract Surgery Cleared for cataract surgery. History of anesthesia tolerance noted. Shortness of breath on exertion not a contraindication since symptoms have been stable for the past year and tolerated general anesthesia and surgery this Spring . Seen by cardiologist and average risk CTA last year  - Send clearance note to surgical team. - Contact surgical team on Monday to  confirm receipt of clearance.  Type 2 Diabetes Mellitus with associated dyslipidemia , HTN and obesity Well-controlled with A1c of 6.5%. - Continue metformin . - Stop metformin  on August 26 for surgery. - discussed GLP-1 agonist but he is not interested   Morbid Obesity - BMI over 35 with co-morbidities such as DM, HTN Weight gain post-knee surgery due to decreased activity. Prefers lifestyle modifications for weight loss. - Encourage return to gym and increased physical activity. - Advise portion control for weight management.  Hypertension Managed with lisinopril  HCTZ 20/25. Blood pressure improved to 130/72. - Continue lisinopril  HCTZ 20/25. - Recheck blood pressure before leaving the clinic.  Obstructive Sleep Apnea on CPAP Uses CPAP. Needs new supplies and possibly a follow-up sleep study. - Send referral to sleep medicine for new CPAP supplies. - Check if a follow-up sleep study is needed.  Hyperlipidemia Managed with atorvastatin . Requires medication refill. - Prescribe atorvastatin . - Check lipid panel today.  Shortness of Breath on Exertion Present for a year, likely related to weight gain and decreased activity. Recent cardiology evaluation showed coronary artery calcification below 50 %  - Follow up with cardiologist Dr. Budd soon , reviewed symptoms of heart disease - Encourage weight loss and increased activity.  Partial Left Knee Replacement with Residual Swelling Residual swelling expected to decrease over time, more pronounced by end of day. - Continue following orthopedic advice for knee care. - Encourage weight loss to help reduce swelling.  Allergic Rhinitis Managed with loratadine  and nasal spray. Symptoms affect singing and speaking. - Continue loratadine . - Use nasal spray daily. - Consider increasing loratadine  to twice daily if symptoms persist.  Diastasis Recti Contributing to changes in body shape with age.  History of Elevated PSA, Under  Urology Surveillance Previously monitored by urology. Last PSA check in September 2023 showed improvement. - Check PSA level today.  Benign Skin Tags and Seborrheic Keratoses Presence of benign skin tags and  seborrheic keratoses, common with aging and diabetes. Cosmetic and not dangerous.

## 2024-02-08 ENCOUNTER — Ambulatory Visit: Payer: Self-pay | Admitting: Family Medicine

## 2024-02-09 ENCOUNTER — Other Ambulatory Visit: Payer: Self-pay

## 2024-02-09 DIAGNOSIS — R779 Abnormality of plasma protein, unspecified: Secondary | ICD-10-CM

## 2024-02-09 DIAGNOSIS — D649 Anemia, unspecified: Secondary | ICD-10-CM

## 2024-02-09 NOTE — Telephone Encounter (Signed)
 Copied from CRM #[id number]. Topic: General - Other >> Feb 09, 2024  9:54 AM Eric English wrote: Reason for CRM: Patient was seen on 8/22 they need office notes patient surgery is on the 02/11/24 [phone] Phone [phone] Fax

## 2024-02-12 LAB — COMPREHENSIVE METABOLIC PANEL WITH GFR
AG Ratio: 1 (calc) (ref 1.0–2.5)
ALT: 28 U/L (ref 9–46)
AST: 20 U/L (ref 10–35)
Albumin: 4.2 g/dL (ref 3.6–5.1)
Alkaline phosphatase (APISO): 72 U/L (ref 35–144)
BUN: 10 mg/dL (ref 7–25)
CO2: 30 mmol/L (ref 20–32)
Calcium: 10.3 mg/dL (ref 8.6–10.3)
Chloride: 99 mmol/L (ref 98–110)
Creat: 0.89 mg/dL (ref 0.70–1.35)
Globulin: 4.1 g/dL — ABNORMAL HIGH (ref 1.9–3.7)
Glucose, Bld: 89 mg/dL (ref 65–99)
Potassium: 3.8 mmol/L (ref 3.5–5.3)
Sodium: 137 mmol/L (ref 135–146)
Total Bilirubin: 0.4 mg/dL (ref 0.2–1.2)
Total Protein: 8.3 g/dL — ABNORMAL HIGH (ref 6.1–8.1)
eGFR: 93 mL/min/1.73m2 (ref >=60)

## 2024-02-12 LAB — CBC WITH DIFFERENTIAL/PLATELET
Absolute Lymphocytes: 1998 {cells}/uL (ref 850–3900)
Absolute Monocytes: 836 {cells}/uL (ref 200–950)
Basophils Absolute: 30 {cells}/uL (ref 0–200)
Basophils Relative: 0.4 %
Eosinophils Absolute: 67 {cells}/uL (ref 15–500)
Eosinophils Relative: 0.9 %
HCT: 40.1 % (ref 38.5–50.0)
Hemoglobin: 12.8 g/dL — ABNORMAL LOW (ref 13.2–17.1)
MCH: 28.3 pg (ref 27.0–33.0)
MCHC: 31.9 g/dL — ABNORMAL LOW (ref 32.0–36.0)
MCV: 88.5 fL (ref 80.0–100.0)
MPV: 11 fL (ref 7.5–12.5)
Monocytes Relative: 11.3 %
Neutro Abs: 4470 {cells}/uL (ref 1500–7800)
Neutrophils Relative %: 60.4 %
Platelets: 279 Thousand/uL (ref 140–400)
RBC: 4.53 Million/uL (ref 4.20–5.80)
RDW: 14.5 % (ref 11.0–15.0)
Total Lymphocyte: 27 %
WBC: 7.4 Thousand/uL (ref 3.8–10.8)

## 2024-02-12 LAB — MICROALBUMIN / CREATININE URINE RATIO
Creatinine, Urine: 127 mg/dL (ref 20–320)
Microalb Creat Ratio: 11 mg/g{creat} (ref <30)
Microalb, Ur: 1.4 mg/dL

## 2024-02-12 LAB — PROTEIN ELECTROPHORESIS, SERUM
Abnormal Protein Band1: 2.2 g/dL — ABNORMAL HIGH
Albumin ELP: 4.2 g/dL (ref 3.8–4.8)
Alpha 1: 0.2 g/dL (ref 0.2–0.3)
Alpha 2: 0.9 g/dL (ref 0.5–0.9)
Beta 2: 0.3 g/dL (ref 0.2–0.5)
Beta Globulin: 0.4 g/dL (ref 0.4–0.6)
Gamma Globulin: 2.7 g/dL — ABNORMAL HIGH (ref 0.8–1.7)
Total Protein: 8.7 g/dL — ABNORMAL HIGH (ref 6.1–8.1)

## 2024-02-12 LAB — LIPID PANEL
Cholesterol: 110 mg/dL (ref <200)
HDL: 33 mg/dL — ABNORMAL LOW (ref >=40)
LDL Cholesterol (Calc): 56 mg/dL
Non-HDL Cholesterol (Calc): 77 mg/dL (ref <130)
Total CHOL/HDL Ratio: 3.3 (calc) (ref <5.0)
Triglycerides: 131 mg/dL (ref <150)

## 2024-02-12 LAB — IRON,TIBC AND FERRITIN PANEL
%SAT: 17 % — ABNORMAL LOW (ref 20–48)
Ferritin: 22 ng/mL — ABNORMAL LOW (ref 24–380)
Iron: 59 ug/dL (ref 50–180)
TIBC: 339 ug/dL (ref 250–425)

## 2024-02-12 LAB — PSA: PSA: 3.09 ng/mL (ref <=4.00)

## 2024-02-17 ENCOUNTER — Other Ambulatory Visit: Payer: Self-pay

## 2024-02-17 DIAGNOSIS — R778 Other specified abnormalities of plasma proteins: Secondary | ICD-10-CM

## 2024-02-17 NOTE — Telephone Encounter (Signed)
 External Hematology referral for Eric English, [place] placed

## 2024-02-23 NOTE — Telephone Encounter (Signed)
 Copied from CRM [id number]. Topic: Referral - Question >> Feb 23, 2024  8:23 AM Amy B wrote: Reason for CRM: Tamika with [facility] received referral with no labs or records attached.  Please fax to [phone] ATTN Dr. Pietro.  Phone [phone] ext [phone]

## 2024-03-14 ENCOUNTER — Encounter: Payer: Self-pay | Admitting: Family Medicine

## 2024-03-14 DIAGNOSIS — D472 Monoclonal gammopathy: Secondary | ICD-10-CM | POA: Insufficient documentation

## 2024-05-16 ENCOUNTER — Encounter: Payer: Self-pay | Admitting: Family Medicine

## 2024-06-23 NOTE — Patient Instructions (Incomplete)
 Preventive Care 73 Years and Older, Male Preventive care refers to lifestyle choices and visits with your health care provider that can promote health and wellness. Preventive care visits are also called wellness exams. What can I expect for my preventive care visit? Counseling During your preventive care visit, your health care provider may ask about your: Medical history, including: Past medical problems. Family medical history. History of falls. Current health, including: Emotional well-being. Home life and relationship well-being. Sexual activity. Memory and ability to understand (cognition). Lifestyle, including: Alcohol, nicotine or tobacco, and drug use. Access to firearms. Diet, exercise, and sleep habits. Work and work Astronomer. Sunscreen use. Safety issues such as seatbelt and bike helmet use. Physical exam Your health care provider will check your: Height and weight. These may be used to calculate your BMI (body mass index). BMI is a measurement that tells if you are at a healthy weight. Waist circumference. This measures the distance around your waistline. This measurement also tells if you are at a healthy weight and may help predict your risk of certain diseases, such as type 2 diabetes and high blood pressure. Heart rate and blood pressure. Body temperature. Skin for abnormal spots. What immunizations do I need?  Vaccines are usually given at various ages, according to a schedule. Your health care provider will recommend vaccines for you based on your age, medical history, and lifestyle or other factors, such as travel or where you work. What tests do I need? Screening Your health care provider may recommend screening tests for certain conditions. This may include: Lipid and cholesterol levels. Diabetes screening. This is done by checking your blood sugar (glucose) after you have not eaten for a while (fasting). Hepatitis C test. Hepatitis B test. HIV (human  immunodeficiency virus) test. STI (sexually transmitted infection) testing, if you are at risk. Lung cancer screening. Colorectal cancer screening. Prostate cancer screening. Abdominal aortic aneurysm (AAA) screening. You may need this if you are a current or former smoker. Talk with your health care provider about your test results, treatment options, and if necessary, the need for more tests. Follow these instructions at home: Eating and drinking  Eat a diet that includes fresh fruits and vegetables, whole grains, lean protein, and low-fat dairy products. Limit your intake of foods with high amounts of sugar, saturated fats, and salt. Take vitamin and mineral supplements as recommended by your health care provider. Do not drink alcohol if your health care provider tells you not to drink. If you drink alcohol: Limit how much you have to 0-2 drinks a day. Know how much alcohol is in your drink. In the [place] one drink equals one 12 oz bottle of beer (355 mL), one 5 oz glass of wine (148 mL), or one 1 oz glass of hard liquor (44 mL). Lifestyle Brush your teeth every morning and night with fluoride toothpaste. Floss one time each day. Exercise for at least 30 minutes 5 or more days each week. Do not use any products that contain nicotine or tobacco. These products include cigarettes, chewing tobacco, and vaping devices, such as e-cigarettes. If you need help quitting, ask your health care provider. Do not use drugs. If you are sexually active, practice safe sex. Use a condom or other form of protection to prevent STIs. Take aspirin only as told by your health care provider. Make sure that you understand how much to take and what form to take. Work with your health care provider to find out whether it is safe  and beneficial for you to take aspirin daily. Ask your health care provider if you need to take a cholesterol-lowering medicine (statin). Find healthy ways to manage stress, such  as: Meditation, yoga, or listening to music. Journaling. Talking to a trusted person. Spending time with friends and family. Safety Always wear your seat belt while driving or riding in a vehicle. Do not drive: If you have been drinking alcohol. Do not ride with someone who has been drinking. When you are tired or distracted. While texting. If you have been using any mind-altering substances or drugs. Wear a helmet and other protective equipment during sports activities. If you have firearms in your house, make sure you follow all gun safety procedures. Minimize exposure to UV radiation to reduce your risk of skin cancer. What's next? Visit your health care provider once a year for an annual wellness visit. Ask your health care provider how often you should have your eyes and teeth checked. Stay up to date on all vaccines. This information is not intended to replace advice given to you by your health care provider. Make sure you discuss any questions you have with your health care provider. Document Revised: 11/28/2020 Document Reviewed: 11/28/2020 Elsevier Patient Education  2024 [employer].

## 2024-06-24 ENCOUNTER — Encounter: Admitting: Family Medicine

## 2024-07-04 ENCOUNTER — Ambulatory Visit: Admitting: Family Medicine

## 2024-07-07 ENCOUNTER — Other Ambulatory Visit: Payer: Self-pay

## 2024-07-07 ENCOUNTER — Encounter: Payer: Self-pay | Admitting: Family Medicine

## 2024-07-07 ENCOUNTER — Ambulatory Visit: Admitting: Family Medicine

## 2024-07-07 VITALS — BP 128/70 | HR 87 | Resp 16 | Ht 71.0 in | Wt 273.8 lb

## 2024-07-07 DIAGNOSIS — G4733 Obstructive sleep apnea (adult) (pediatric): Secondary | ICD-10-CM

## 2024-07-07 DIAGNOSIS — E1159 Type 2 diabetes mellitus with other circulatory complications: Secondary | ICD-10-CM

## 2024-07-07 DIAGNOSIS — J3089 Other allergic rhinitis: Secondary | ICD-10-CM | POA: Diagnosis not present

## 2024-07-07 DIAGNOSIS — Z7984 Long term (current) use of oral hypoglycemic drugs: Secondary | ICD-10-CM | POA: Diagnosis not present

## 2024-07-07 DIAGNOSIS — I152 Hypertension secondary to endocrine disorders: Secondary | ICD-10-CM | POA: Diagnosis not present

## 2024-07-07 DIAGNOSIS — Z23 Encounter for immunization: Secondary | ICD-10-CM | POA: Diagnosis not present

## 2024-07-07 DIAGNOSIS — C8800 Waldenstrom macroglobulinemia not having achieved remission: Secondary | ICD-10-CM | POA: Diagnosis not present

## 2024-07-07 DIAGNOSIS — E785 Hyperlipidemia, unspecified: Secondary | ICD-10-CM

## 2024-07-07 DIAGNOSIS — E1169 Type 2 diabetes mellitus with other specified complication: Secondary | ICD-10-CM | POA: Diagnosis not present

## 2024-07-07 DIAGNOSIS — J302 Other seasonal allergic rhinitis: Secondary | ICD-10-CM

## 2024-07-07 DIAGNOSIS — B353 Tinea pedis: Secondary | ICD-10-CM | POA: Diagnosis not present

## 2024-07-07 LAB — POCT GLYCOSYLATED HEMOGLOBIN (HGB A1C): Hemoglobin A1C: 6.6 % — AB (ref 4.0–5.6)

## 2024-07-07 MED ORDER — ATORVASTATIN CALCIUM 40 MG PO TABS: 40.0000 mg | ORAL_TABLET | Freq: Every day | ORAL | 1 refills | Status: AC

## 2024-07-07 MED ORDER — TERBINAFINE HCL 250 MG PO TABS: 250.0000 mg | ORAL_TABLET | Freq: Two times a day (BID) | ORAL | 0 refills | Status: AC

## 2024-07-07 MED ORDER — METFORMIN HCL ER 500 MG PO TB24: 500.0000 mg | ORAL_TABLET | Freq: Every day | ORAL | 1 refills | Status: AC

## 2024-07-07 MED ORDER — LISINOPRIL-HYDROCHLOROTHIAZIDE 20-25 MG PO TABS: 1.0000 | ORAL_TABLET | Freq: Every day | ORAL | 1 refills | Status: AC

## 2024-07-07 NOTE — Progress Notes (Signed)
 Name: Eric English.   MRN: [id number]    DOB: 1955/08/16   Date:07/07/2024       Progress Note  Subjective  Chief Complaint  Chief Complaint  Patient presents with   Medical Management of Chronic Issues   Discussed the use of AI scribe software for clinical note transcription with the patient, who gave verbal consent to proceed.  History of Present Illness Eric English. 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  He was previously diagnosed with microglobulinemia and is currently asymptomatic. His total protein level is elevated at 9.3.  His diabetes is managed with metformin  500 mg XR once daily. His A1c is 6.6%, slightly up from 6.5% in August, but still well-controlled. [place].  He has dyslipidemia and hypertension, managed with atorvastatin  40 mg and lisinopril  HCTZ 20/25 mg, respectively. His blood pressure is 128/70, and his lipid panel from August showed a low LDL of 56.  He experiences swelling in his legs, which resolves overnight and is attributed to prolonged sitting and weight. He has adjusted his workstation to a standing desk to help alleviate this.  He has a history of sleep apnea and uses a CPAP machine, but is unable to get replacement parts due to the need for a new sleep study. He reports significant benefit from using the CPAP.  He has a fungal infection on his feet, which he initially thought was gout. He describes the skin as tough and has noticed a lesion that he attempted to drain himself.  He takes loratadine  as needed for allergies, sometimes requiring more than one pill a day to manage symptoms.    Patient Active Problem List   Diagnosis Date Noted   Macroglobulinemia of [place] (HCC) 07/07/2024   Hypertension associated with type 2 diabetes mellitus (HCC) 07/07/2024   MGUS (monoclonal gammopathy of unknown  significance) 03/14/2024   Dyslipidemia 09/05/2022   Perennial allergic rhinitis with seasonal variation 03/03/2022   Morbid obesity (HCC) 02/27/2020   History of elevated PSA 08/12/2019   OSA on CPAP 08/12/2019   Dyslipidemia associated with type 2 diabetes mellitus (HCC) 03/18/2019   Chronic left shoulder pain 01/04/2018   Chronic seasonal allergic rhinitis due to pollen 09/19/2016   Hemorrhoids 07/15/2016   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   Superficial thrombophlebitis 06/23/2015   Diastasis recti 12/27/2013   Benign essential HTN 03/15/2010    Past Surgical History:  Procedure Laterality Date   COLONOSCOPY  06/16/2004   COLONOSCOPY WITH PROPOFOL  N/A 08/15/2015   Procedure: COLONOSCOPY WITH PROPOFOL ;  Surgeon: Reyes LELON Cota, MD;  Location: [facility] ENDOSCOPY;  Service: Endoscopy;  Laterality: N/A;   HERNIA REPAIR  06/17/2007   umbilical   MEDIAL PARTIAL KNEE REPLACEMENT Left 09/2023   VASECTOMY  06/16/1980    Family History  Problem Relation Age of Onset   Diabetes Mother    Hypertension Mother    CAD Mother    Hyperlipidemia Mother    Heart disease Father    CAD Father    Diabetes Father    Alcohol abuse Father    Hypertension Sister    Scleroderma Sister    Obesity Sister    Alcohol abuse Brother     Social History   Tobacco Use   Smoking status: Former    Current packs/day: 0.00    Average packs/day: 1 pack/day for 24.0 years (  24.0 ttl pk-yrs)    Types: Cigarettes    Start date: 09/14/1973    Quit date: 09/14/1997    Years since quitting: 26.8   Smokeless tobacco: Never  Substance Use Topics   Alcohol use: Yes    Alcohol/week: 2.0 - 3.0 standard drinks of alcohol    Types: 1 Glasses of wine, 1 - 2 Cans of beer per week    Comment: Occasionally    Current Medications[1]  Allergies[2]  I personally reviewed active problem list, medication list, allergies, family history with the patient/caregiver today.   ROS  Ten systems  reviewed and is negative except as mentioned in HPI    Objective Physical Exam  CONSTITUTIONAL: Patient appears well-developed and well-nourished. No distress. HEENT: Head atraumatic, normocephalic, neck supple. CARDIOVASCULAR: Normal rate, regular rhythm and normal heart sounds. No murmur heard. No BLE edema. Extremities examined. PULMONARY: Effort normal and breath sounds normal. No respiratory distress. ABDOMINAL: There is no tenderness or distention. MUSCULOSKELETAL: Normal gait. Without gross motor or sensory deficit. PSYCHIATRIC: Patient has a normal mood and affect. Behavior is normal. Judgment and thought content normal. NEUROLOGICAL: Sensation intact bilaterally. SKIN: Fungal infection on feet.  Vitals:   07/07/24 0947  BP: 128/70  Pulse: 87  Resp: 16  SpO2: 94%  Weight: 273 lb 12.8 oz (124.2 kg)  Height: 5' 11 (1.803 m)    Body mass index is 38.19 kg/m.  Recent Results (from the past 2160 hours)  POCT glycosylated hemoglobin (Hb A1C)     Status: Abnormal   Collection Time: 07/07/24  9:52 AM  Result Value Ref Range   Hemoglobin A1C 6.6 (A) 4.0 - 5.6 %   HbA1c POC (<> result, manual entry)     HbA1c, POC (prediabetic range)     HbA1c, POC (controlled diabetic range)      Diabetic Foot Exam:  Diabetic foot exam was performed with the following findings:   Normal sensation of 10g monofilament Intact posterior tibialis and dorsalis pedis pulses Tinea pedis       PHQ2/9:    07/07/2024    9:39 AM 02/05/2024    8:29 AM 10/08/2023    1:39 PM 04/27/2023    2:59 PM 10/03/2022    1:40 PM  Depression screen PHQ 2/9  Decreased Interest 0 0 0 0 0  Down, Depressed, Hopeless 0 0 0 0 0  PHQ - 2 Score 0 0 0 0 0  Altered sleeping   1 0 0  Tired, decreased energy   0 0 0  Change in appetite   0 0 0  Feeling bad or failure about yourself    0 0 0  Trouble concentrating   0 0 0  Moving slowly or fidgety/restless   0 0 0  Suicidal thoughts   0 0 0  PHQ-9 Score   1  0   0   Difficult doing work/chores   Not difficult at all Not difficult at all      Data saved with a previous flowsheet row definition    phq 9 is negative  Fall Risk:    07/07/2024    9:39 AM 02/05/2024    8:28 AM 10/08/2023    1:38 PM 04/27/2023    2:59 PM 10/03/2022    1:33 PM  Fall Risk   Falls in the past year? 0 0 0 0 0  Number falls in past yr: 0 0 0 0 0  Injury with Fall? 0 0  0  0  0   Risk for fall due to : No Fall Risks No Fall Risks No Fall Risks  No Fall Risks  Follow up Falls evaluation completed Falls evaluation completed Falls prevention discussed;Falls evaluation completed  Education provided;Falls prevention discussed     Data saved with a previous flowsheet row definition      Assessment & Plan Type 2 diabetes mellitus with hypertension and dyslipidemia Diabetes well-controlled with A1c at 6.6%. Hypertension controlled at 128/70 mmHg. Dyslipidemia managed with atorvastatin ; LDL at 56 mg/dL, low HDL likely due to inactivity. - Continue metformin  500 mg XR once daily. - Continue atorvastatin  40 mg daily. - Continue lisinopril  HCTZ 20/25 mg daily. - Encouraged physical activity to improve HDL. - Advised dietary modifications to prevent blood sugar spikes.  Morbid obesity BMI 38.19, classified as morbid obesity due to BMI over 35 with co-morbidities such as diabetes . Weight loss recommended to reduce BMI and improve health. - Encouraged weight loss through increased activity and dietary changes. - Discussed potential use of weight loss medications like Rybelsus or Ozempic.  Tinea pedis Extensive fungal infection requiring oral antifungal treatment. - Prescribed oral antifungal medication, 250 mg twice daily for 7 days.  Obstructive sleep apnea Difficulty obtaining replacement parts due to age restrictions. - Arranged new sleep study to update CPAP settings and obtain supplies.  Allergic rhinitis Managed with loratadine . Reports increased congestion  requiring more frequent dosing. - Advised loratadine  twice daily as needed. - Recommended nasal spray for congestion relief.        [1]  Current Outpatient Medications:    acetaminophen  (TYLENOL ) 500 MG tablet, Take 1 tablet (500 mg total) by mouth every 6 (six) hours as needed., Disp: 100 tablet, Rfl: 0   atorvastatin  (LIPITOR) 40 MG tablet, Take 1 tablet (40 mg total) by mouth daily., Disp: 90 tablet, Rfl: 1   B Complex Vitamins (VITAMIN B COMPLEX PO), Take 1 tablet by mouth daily., Disp: , Rfl:    lisinopril -hydrochlorothiazide  (ZESTORETIC ) 20-25 MG tablet, Take 1 tablet by mouth daily., Disp: 90 tablet, Rfl: 1   loratadine  (CLARITIN ) 10 MG tablet, Take 1 tablet (10 mg total) by mouth daily., Disp: 90 tablet, Rfl: 3   metFORMIN  (GLUCOPHAGE -XR) 500 MG 24 hr tablet, Take 1 tablet (500 mg total) by mouth daily with breakfast., Disp: 90 tablet, Rfl: 1   Omega-3 Fatty Acids (FISH OIL) 1200 MG CAPS, Take 1 capsule by mouth 2 (two) times daily., Disp: , Rfl:  [2] No Known Allergies

## 2024-07-08 ENCOUNTER — Ambulatory Visit: Admitting: Family Medicine

## 2024-08-01 ENCOUNTER — Encounter: Admitting: Family Medicine

## 2024-10-10 ENCOUNTER — Ambulatory Visit

## 2024-10-14 ENCOUNTER — Ambulatory Visit

## 2024-11-02 ENCOUNTER — Ambulatory Visit: Admitting: Family Medicine

## 2024-11-04 ENCOUNTER — Ambulatory Visit: Admitting: Family Medicine
